# Patient Record
Sex: Female | Born: 1955
Health system: Southern US, Community
[De-identification: ages and names within clinical notes are randomized; demographics above are authoritative.]

## PROBLEM LIST (undated history)

## (undated) DIAGNOSIS — J45909 Unspecified asthma, uncomplicated: Secondary | ICD-10-CM

## (undated) DIAGNOSIS — M1990 Unspecified osteoarthritis, unspecified site: Secondary | ICD-10-CM

## (undated) DIAGNOSIS — F329 Major depressive disorder, single episode, unspecified: Secondary | ICD-10-CM

## (undated) DIAGNOSIS — N182 Chronic kidney disease, stage 2 (mild): Secondary | ICD-10-CM

## (undated) DIAGNOSIS — F32A Depression, unspecified: Secondary | ICD-10-CM

## (undated) DIAGNOSIS — I5022 Chronic systolic (congestive) heart failure: Secondary | ICD-10-CM

## (undated) DIAGNOSIS — M797 Fibromyalgia: Secondary | ICD-10-CM

## (undated) DIAGNOSIS — M549 Dorsalgia, unspecified: Secondary | ICD-10-CM

## (undated) DIAGNOSIS — J189 Pneumonia, unspecified organism: Secondary | ICD-10-CM

## (undated) DIAGNOSIS — I428 Other cardiomyopathies: Secondary | ICD-10-CM

## (undated) DIAGNOSIS — K219 Gastro-esophageal reflux disease without esophagitis: Secondary | ICD-10-CM

## (undated) DIAGNOSIS — E039 Hypothyroidism, unspecified: Secondary | ICD-10-CM

## (undated) DIAGNOSIS — F419 Anxiety disorder, unspecified: Secondary | ICD-10-CM

## (undated) DIAGNOSIS — G4733 Obstructive sleep apnea (adult) (pediatric): Secondary | ICD-10-CM

## (undated) DIAGNOSIS — I447 Left bundle-branch block, unspecified: Secondary | ICD-10-CM

## (undated) DIAGNOSIS — C44300 Unspecified malignant neoplasm of skin of unspecified part of face: Secondary | ICD-10-CM

## (undated) DIAGNOSIS — J449 Chronic obstructive pulmonary disease, unspecified: Secondary | ICD-10-CM

## (undated) DIAGNOSIS — Z9989 Dependence on other enabling machines and devices: Secondary | ICD-10-CM

## (undated) DIAGNOSIS — Z95810 Presence of automatic (implantable) cardiac defibrillator: Secondary | ICD-10-CM

## (undated) DIAGNOSIS — J42 Unspecified chronic bronchitis: Secondary | ICD-10-CM

## (undated) DIAGNOSIS — G8929 Other chronic pain: Secondary | ICD-10-CM

## (undated) HISTORY — PX: HUMERUS FRACTURE SURGERY: SHX670

## (undated) HISTORY — PX: TONSILLECTOMY: SUR1361

## (undated) HISTORY — PX: LAPAROSCOPIC GASTRIC BANDING: SHX1100

## (undated) HISTORY — PX: CARDIAC CATHETERIZATION: SHX172

## (undated) HISTORY — PX: VAGINAL HYSTERECTOMY: SUR661

## (undated) HISTORY — PX: FRACTURE SURGERY: SHX138

## (undated) HISTORY — PX: TUBAL LIGATION: SHX77

## (undated) HISTORY — PX: SHOULDER OPEN ROTATOR CUFF REPAIR: SHX2407

---

## 2004-10-06 ENCOUNTER — Other Ambulatory Visit: Admission: RE | Admit: 2004-10-06 | Discharge: 2004-10-06 | Payer: Self-pay | Admitting: Family Medicine

## 2005-04-27 ENCOUNTER — Other Ambulatory Visit: Admission: RE | Admit: 2005-04-27 | Discharge: 2005-04-27 | Payer: Self-pay | Admitting: Family Medicine

## 2013-09-05 ENCOUNTER — Encounter (HOSPITAL_COMMUNITY): Payer: Self-pay | Admitting: Emergency Medicine

## 2013-09-05 ENCOUNTER — Emergency Department (HOSPITAL_COMMUNITY): Payer: 59

## 2013-09-05 ENCOUNTER — Inpatient Hospital Stay (HOSPITAL_COMMUNITY)
Admission: EM | Admit: 2013-09-05 | Discharge: 2013-09-11 | DRG: 287 | Disposition: A | Discharge status: Home / Self Care | Payer: 59 | Attending: Internal Medicine | Admitting: Internal Medicine

## 2013-09-05 DIAGNOSIS — Z9189 Other specified personal risk factors, not elsewhere classified: Secondary | ICD-10-CM | POA: Diagnosis present

## 2013-09-05 DIAGNOSIS — Z87891 Personal history of nicotine dependence: Secondary | ICD-10-CM | POA: Diagnosis not present

## 2013-09-05 DIAGNOSIS — F329 Major depressive disorder, single episode, unspecified: Secondary | ICD-10-CM | POA: Diagnosis present

## 2013-09-05 DIAGNOSIS — F411 Generalized anxiety disorder: Secondary | ICD-10-CM | POA: Diagnosis present

## 2013-09-05 DIAGNOSIS — I5043 Acute on chronic combined systolic (congestive) and diastolic (congestive) heart failure: Principal | ICD-10-CM | POA: Diagnosis present

## 2013-09-05 DIAGNOSIS — N182 Chronic kidney disease, stage 2 (mild): Secondary | ICD-10-CM | POA: Diagnosis present

## 2013-09-05 DIAGNOSIS — I428 Other cardiomyopathies: Secondary | ICD-10-CM | POA: Diagnosis present

## 2013-09-05 DIAGNOSIS — I447 Left bundle-branch block, unspecified: Secondary | ICD-10-CM | POA: Diagnosis present

## 2013-09-05 DIAGNOSIS — J45909 Unspecified asthma, uncomplicated: Secondary | ICD-10-CM | POA: Diagnosis present

## 2013-09-05 DIAGNOSIS — Z886 Allergy status to analgesic agent status: Secondary | ICD-10-CM | POA: Diagnosis not present

## 2013-09-05 DIAGNOSIS — E039 Hypothyroidism, unspecified: Secondary | ICD-10-CM | POA: Diagnosis present

## 2013-09-05 DIAGNOSIS — I509 Heart failure, unspecified: Secondary | ICD-10-CM | POA: Diagnosis present

## 2013-09-05 DIAGNOSIS — F3289 Other specified depressive episodes: Secondary | ICD-10-CM | POA: Diagnosis present

## 2013-09-05 DIAGNOSIS — Z9889 Other specified postprocedural states: Secondary | ICD-10-CM

## 2013-09-05 DIAGNOSIS — R0602 Shortness of breath: Secondary | ICD-10-CM | POA: Diagnosis present

## 2013-09-05 DIAGNOSIS — IMO0001 Reserved for inherently not codable concepts without codable children: Secondary | ICD-10-CM | POA: Diagnosis present

## 2013-09-05 DIAGNOSIS — R079 Chest pain, unspecified: Secondary | ICD-10-CM | POA: Diagnosis present

## 2013-09-05 HISTORY — DX: Other cardiomyopathies: I42.8

## 2013-09-05 HISTORY — DX: Anxiety disorder, unspecified: F41.9

## 2013-09-05 HISTORY — DX: Major depressive disorder, single episode, unspecified: F32.9

## 2013-09-05 HISTORY — DX: Fibromyalgia: M79.7

## 2013-09-05 HISTORY — DX: Hypothyroidism, unspecified: E03.9

## 2013-09-05 HISTORY — DX: Depression, unspecified: F32.A

## 2013-09-05 HISTORY — DX: Unspecified asthma, uncomplicated: J45.909

## 2013-09-05 LAB — COMPREHENSIVE METABOLIC PANEL
ALT: 102 U/L — AB (ref 0–35)
AST: 56 U/L — AB (ref 0–37)
Albumin: 3.8 g/dL (ref 3.5–5.2)
Alkaline Phosphatase: 89 U/L (ref 39–117)
Anion gap: 12 (ref 5–15)
BILIRUBIN TOTAL: 0.3 mg/dL (ref 0.3–1.2)
BUN: 17 mg/dL (ref 6–23)
CHLORIDE: 107 meq/L (ref 96–112)
CO2: 23 meq/L (ref 19–32)
Calcium: 10 mg/dL (ref 8.4–10.5)
Creatinine, Ser: 0.94 mg/dL (ref 0.50–1.10)
GFR calc Af Amer: 77 mL/min — ABNORMAL LOW (ref >90)
GFR, EST NON AFRICAN AMERICAN: 67 mL/min — AB (ref >90)
Glucose, Bld: 114 mg/dL — ABNORMAL HIGH (ref 70–99)
Potassium: 5.1 mEq/L (ref 3.7–5.3)
SODIUM: 142 meq/L (ref 137–147)
Total Protein: 7.3 g/dL (ref 6.0–8.3)

## 2013-09-05 LAB — CBC WITH DIFFERENTIAL/PLATELET
BASOS ABS: 0 10*3/uL (ref 0.0–0.1)
Basophils Relative: 0 % (ref 0–1)
Eosinophils Absolute: 0 10*3/uL (ref 0.0–0.7)
Eosinophils Relative: 0 % (ref 0–5)
HEMATOCRIT: 40.1 % (ref 36.0–46.0)
Hemoglobin: 13.1 g/dL (ref 12.0–15.0)
LYMPHS PCT: 18 % (ref 12–46)
Lymphs Abs: 1.5 10*3/uL (ref 0.7–4.0)
MCH: 29.8 pg (ref 26.0–34.0)
MCHC: 32.7 g/dL (ref 30.0–36.0)
MCV: 91.1 fL (ref 78.0–100.0)
MONO ABS: 0.4 10*3/uL (ref 0.1–1.0)
Monocytes Relative: 5 % (ref 3–12)
Neutro Abs: 6.3 10*3/uL (ref 1.7–7.7)
Neutrophils Relative %: 77 % (ref 43–77)
PLATELETS: 202 10*3/uL (ref 150–400)
RBC: 4.4 MIL/uL (ref 3.87–5.11)
RDW: 14.5 % (ref 11.5–15.5)
WBC: 8.2 10*3/uL (ref 4.0–10.5)

## 2013-09-05 LAB — TROPONIN I: Troponin I: <0.3 ng/mL (ref <0.30)

## 2013-09-05 LAB — PRO B NATRIURETIC PEPTIDE: Pro B Natriuretic peptide (BNP): 8605 pg/mL — ABNORMAL HIGH (ref 0–125)

## 2013-09-05 MED ORDER — FUROSEMIDE 10 MG/ML IJ SOLN
40.0000 mg | Freq: Once | INTRAMUSCULAR | Status: AC
  Administered 2013-09-05: 40 mg via INTRAVENOUS
  Filled 2013-09-05: qty 4

## 2013-09-05 MED ORDER — NITROGLYCERIN 2 % TD OINT
1.0000 [in_us] | TOPICAL_OINTMENT | Freq: Four times a day (QID) | TRANSDERMAL | Status: DC
  Administered 2013-09-05: 1 [in_us] via TOPICAL
  Filled 2013-09-05: qty 1

## 2013-09-05 MED ORDER — IOHEXOL 350 MG/ML SOLN
100.0000 mL | Freq: Once | INTRAVENOUS | Status: AC | PRN
  Administered 2013-09-05: 100 mL via INTRAVENOUS

## 2013-09-05 NOTE — ED Provider Notes (Signed)
CSN: [id number]     Arrival date & time 09/05/13  1638 History   First MD Initiated Contact with Patient 09/05/13 1649     Chief Complaint  Patient presents with  . Chest Pain  . Shortness of Breath     (Consider location/radiation/quality/duration/timing/severity/associated sxs/prior Treatment) HPI Comments: Patient presents to the ER for evaluation of shortness of breath and chest discomfort. Patient was referred to the emergency department by urgent care. Patient reports that she first started to feel short of breath for 2 or 3 days ago. She was seen in urgent care and prescribed prednisone, albuterol and a Z-Pak. She did not improve. Over last 2 days she has had progressively worsening difficulty breathing. She reports that she cannot lie flat night because of shortness of breath, has been sleeping sitting up. She has noticed that the breathing difficulty worsens when she gets up and walks. She has developed anterior chest pain and pain between her shoulder blades with exertion which resolves with rest. She presented once again to urgent care today, had an abnormal EKG and was sent to the ER. She has been given 324 mg aspirin, one sublingual nitroglycerin with improvement of her pain.  Patient is a 58 y.o. female presenting with chest pain and shortness of breath.  Chest Pain Associated symptoms: shortness of breath   Shortness of Breath Associated symptoms: chest pain     Past Medical History  Diagnosis Date  . Asthma   . Bronchitis   . PNA (pneumonia)   . Thyroid disease   . Fibromyalgia   . Depression   . Anxiety    Past Surgical History  Procedure Laterality Date  . Abdominal hysterectomy    . Shoulder open rotator cuff repair Left   . Laparoscopic gastric banding     History reviewed. No pertinent family history. History  Substance Use Topics  . Smoking status: Former Smoker    Types: Cigarettes  . Smokeless tobacco: Never Used  . Alcohol Use: Yes     Comment:  occasionally   OB History   Grav Para Term Preterm Abortions TAB SAB Ect Mult Living                 Review of Systems  Respiratory: Positive for shortness of breath.   Cardiovascular: Positive for chest pain.  All other systems reviewed and are negative.     Allergies  Percocet  Home Medications   Prior to Admission medications   Medication Sig Start Date End Date Taking? Authorizing Provider  levothyroxine (SYNTHROID, LEVOTHROID) 150 MCG tablet Take 150 mcg by mouth daily before breakfast.   Yes Historical Provider, MD  sertraline (ZOLOFT) 100 MG tablet Take 100 mg by mouth daily.   Yes Historical Provider, MD   BP 109/69  Pulse 99  Temp(Src) 97.3 F (36.3 C) (Oral)  Resp 21  SpO2 94% Physical Exam  Constitutional: She is oriented to person, place, and time. She appears well-developed and well-nourished. No distress.  HENT:  Head: Normocephalic and atraumatic.  Right Ear: Hearing normal.  Left Ear: Hearing normal.  Nose: Nose normal.  Mouth/Throat: Oropharynx is clear and moist and mucous membranes are normal.  Eyes: Conjunctivae and EOM are normal. Pupils are equal, round, and reactive to light.  Neck: Normal range of motion. Neck supple.  Cardiovascular: Regular rhythm, S1 normal and S2 normal.  Exam reveals no gallop and no friction rub.   No murmur heard. Pulmonary/Chest: Effort normal and breath sounds normal. No respiratory  distress. She exhibits no tenderness.  Abdominal: Soft. Normal appearance and bowel sounds are normal. There is no hepatosplenomegaly. There is no tenderness. There is no rebound, no guarding, no tenderness at McBurney's point and negative Murphy's sign. No hernia.  Musculoskeletal: Normal range of motion.  Neurological: She is alert and oriented to person, place, and time. She has normal strength. No cranial nerve deficit or sensory deficit. Coordination normal. GCS eye subscore is 4. GCS verbal subscore is 5. GCS motor subscore is 6.  Skin:  Skin is warm, dry and intact. No rash noted. No cyanosis.  Psychiatric: She has a normal mood and affect. Her speech is normal and behavior is normal. Thought content normal.    ED Course  Procedures (including critical care time) Labs Review Labs Reviewed  COMPREHENSIVE METABOLIC PANEL - Abnormal; Notable for the following:    Glucose, Bld 114 (*)    AST 56 (*)    ALT 102 (*)    GFR calc non Af Amer 67 (*)    GFR calc Af Amer 77 (*)    All other components within normal limits  PRO B NATRIURETIC PEPTIDE - Abnormal; Notable for the following:    Pro B Natriuretic peptide (BNP) 8605.0 (*)    All other components within normal limits  CBC WITH DIFFERENTIAL  TROPONIN I    Imaging Review Dg Chest 2 View  09/05/2013   CLINICAL DATA:  Chest pain and shortness of breath. Wheezing. Difficulty breathing.  EXAM: CHEST  2 VIEW  COMPARISON:  Two-view chest 09/05/2013  FINDINGS: Heart is mildly enlarged without change. Mild pulmonary vascular congestion is evident. Small bilateral pleural effusions are now present. Bibasilar atelectasis is noted.  IMPRESSION: 1. Increasing pulmonary vascular congestion and small bilateral pleural effusions concerning for early congestive heart failure. 2. Bibasilar airspace disease likely reflects atelectasis.   Electronically Signed   By: Lawrence Santiago M.D.   On: 09/05/2013 18:38   Ct Angio Chest Pe W/cm &/or Wo Cm  09/05/2013   CLINICAL DATA:  Chest tightness and shortness of breath.  EXAM: CT ANGIOGRAPHY CHEST WITH CONTRAST  TECHNIQUE: Multidetector CT imaging of the chest was performed using the standard protocol during bolus administration of intravenous contrast. Multiplanar CT image reconstructions and MIPs were obtained to evaluate the vascular anatomy.  CONTRAST:  124mL OMNIPAQUE IOHEXOL 350 MG/ML SOLN  COMPARISON:  Chest x-ray 09/05/2013.  FINDINGS: Limited exam due to patient's size and motion artifact. Thoracic aorta is non dilated. Severe cardiomegaly. No  pulmonary embolus identified.  No definite mediastinal or hilar adenopathy. Again this exam is limited due to patient's size and motion artifact. Thoracic esophagus is nondistended.  Large airways are patent. Diffuse interstitial prominence and small bilateral pleural effusions are present. These findings suggest congestive heart failure. Pneumonitis cannot be excluded.  Prior gastric surgery. 4.8 cm cyst central portion of the liver. 1 cm cyst periphery of the right lobe of the liver.  No significant axillary adenopathy. Chest wall is intact. No acute bony abnormality.  Review of the MIP images confirms the above findings.  IMPRESSION: 1. Limited exam due to patient's size and motion artifact. No pulmonary embolus identified. 2. Findings suggesting congestive heart failure with pulmonary interstitial edema. Pneumonitis cannot be excluded .   Electronically Signed   By: Marcello Moores  Register   On: 09/05/2013 20:45     EKG Interpretation   Date/Time:  Friday September 05 2013 16:49:30 EDT Ventricular Rate:  97 PR Interval:  138 QRS Duration: 168  QT Interval:  422 QTC Calculation: 536 R Axis:   -8 Text Interpretation:  Sinus rhythm Probable left atrial enlargement Left  bundle branch block Baseline wander in lead(s) I II aVR aVF V6 No previous  tracing Confirmed by POLLINA  MD, [place] ([id number]) on 09/05/2013 5:08:17  PM      MDM   Final diagnoses:  None   acute onset congestive heart failure  Patient presents to ER for evaluation of difficulty breathing and chest discomfort. Symptoms began 3 days ago. She has been extensively progressively worsening dyspnea on exertion, orthopnea and exertional dyspnea with chest pain. Patient was sent from urgent care because x-ray showed possible congestive heart failure and she had an abnormal EKG. EKG here shows left bundle branch block. This is of unknown chronicity, we do not have previous EKGs. Patient reports that approximately 3 years ago she had an EKG  and was reportedly normal. She has not, however, experiencing acute chest pain this is not considered to be an acute MI at this point. She does, however, have cardiomegaly with some findings consistent with congestive heart failure. Check a markedly elevated BNP. I did consider right heart strain elevated BNP, PE study was negative, however. As this is a new onset congestive heart failure presentation with significant symptomatology, cardiology consult to evaluate for further management.    Orpah Greek, MD 09/05/13 [phone]

## 2013-09-05 NOTE — H&P (Signed)
Physician History and Physical    [name] MRN: [id number] DOB/AGE: 1955-09-24 58 y.o. Admit date: 09/05/2013  Primary Cardiologist:  None  CC:  SOB  HPI:  58 yo female with h/o asthma who presented to ER with SOB/DOE/Chest pressure/Orthopnea/PND. Patient states that her symptoms first started about 2 months ago when she became quickly fatigued with decreased functional status. The symptoms gradually worsened . She started to have markedly SOB with orthopnea, PND and chest pressure. She went to urgent care where an ECG reveal LBBB and she was sent to [facility] ER.  She denied any significant chest pain or syncope.  She is former smoker but quit 25 yrs ago. She dose not abuse EtOH.    Review of systems: A review of 10 organ systems was done and is negative except as stated above in HPI  Past Medical History  Diagnosis Date  . Asthma   . Bronchitis   . PNA (pneumonia)   . Thyroid disease   . Fibromyalgia   . Depression   . Anxiety    Past Surgical History  Procedure Laterality Date  . Abdominal hysterectomy    . Shoulder open rotator cuff repair Left   . Laparoscopic gastric banding     History   Social History  . Marital Status: Widowed    Spouse Name: N/A    Number of Children: N/A  . Years of Education: N/A   Occupational History  . Not on file.   Social History Main Topics  . Smoking status: Former Smoker    Types: Cigarettes  . Smokeless tobacco: Never Used  . Alcohol Use: Yes     Comment: occasionally  . Drug Use: No  . Sexual Activity: Not on file   Other Topics Concern  . Not on file   Social History Narrative  . No narrative on file    History reviewed. No pertinent family history.   Allergies  Allergen Reactions  . Percocet [Oxycodone-Acetaminophen] Itching     (Not in a hospital admission)  Current Facility-Administered Medications  Medication Dose Route Frequency Provider Last Rate Last Dose  . nitroGLYCERIN (NITROGLYN) 2 % ointment  1 inch  1 inch Topical 4 times per day Orpah Greek, MD   1 inch at 09/05/13 1735   Current Outpatient Prescriptions  Medication Sig Dispense Refill  . levothyroxine (SYNTHROID, LEVOTHROID) 150 MCG tablet Take 150 mcg by mouth daily before breakfast.      . sertraline (ZOLOFT) 100 MG tablet Take 100 mg by mouth daily.        Physical Exam: Blood pressure 109/69, pulse 99, temperature 97.3 F (36.3 C), temperature source Oral, resp. rate 21, SpO2 94.00%.; There is no height or weight on file to calculate BMI. Temp:  [97.3 F (36.3 C)] 97.3 F (36.3 C) (08/28 1641) Pulse Rate:  [92-107] 99 (08/28 2245) Resp:  [11-26] 21 (08/28 2245) BP: (102-130)/(66-94) 109/69 mmHg (08/28 2245) SpO2:  [94 %-100 %] 94 % (08/28 2245)   Intake/Output Summary (Last 24 hours) at 09/05/13 2323 Last data filed at 09/05/13 2235  Gross per 24 hour  Intake      0 ml  Output   1200 ml  Net  -1200 ml   General: NAD Heent: MMM Neck: No JVD  CV: Nondisplaced PMI.  RRR, nl S1/S2, no S3/S4, no murmur. No carotid bruit   Lungs: Clear to auscultation bilaterally with normal respiratory effort Abdomen: Soft, nontender, nondistended Extremities: No clubbing or cyanosis.  Normal  pedal pulses. No pedal edema Skin: Intact without lesions or rashes  Neurologic: Alert and oriented x 3, grossly nonfocal  Psych: Normal mood and affect    Labs:  Recent Labs  09/05/13 1720  TROPONINI <0.30   Lab Results  Component Value Date   WBC 8.2 09/05/2013   HGB 13.1 09/05/2013   HCT 40.1 09/05/2013   MCV 91.1 09/05/2013   PLT 202 09/05/2013    Recent Labs Lab 09/05/13 1720  NA 142  K 5.1  CL 107  CO2 23  BUN 17  CREATININE 0.94  CALCIUM 10.0  PROT 7.3  BILITOT 0.3  ALKPHOS 89  ALT 102*  AST 56*  GLUCOSE 114*   No results found for this basename: CHOL, HDL, LDLCALC, TRIG     EKG:  Sinus with LBBB and LAE.  Radiology:  Dg Chest 2 View  09/05/2013   CLINICAL DATA:  Chest pain and shortness of  breath. Wheezing. Difficulty breathing.  EXAM: CHEST  2 VIEW  COMPARISON:  Two-view chest 09/05/2013  FINDINGS: Heart is mildly enlarged without change. Mild pulmonary vascular congestion is evident. Small bilateral pleural effusions are now present. Bibasilar atelectasis is noted.  IMPRESSION: 1. Increasing pulmonary vascular congestion and small bilateral pleural effusions concerning for early congestive heart failure. 2. Bibasilar airspace disease likely reflects atelectasis.   Electronically Signed   By: Lawrence Santiago M.D.   On: 09/05/2013 18:38   Ct Angio Chest Pe W/cm &/or Wo Cm  09/05/2013   CLINICAL DATA:  Chest tightness and shortness of breath.  EXAM: CT ANGIOGRAPHY CHEST WITH CONTRAST  TECHNIQUE: Multidetector CT imaging of the chest was performed using the standard protocol during bolus administration of intravenous contrast. Multiplanar CT image reconstructions and MIPs were obtained to evaluate the vascular anatomy.  CONTRAST:  134mL OMNIPAQUE IOHEXOL 350 MG/ML SOLN  COMPARISON:  Chest x-ray 09/05/2013.  FINDINGS: Limited exam due to patient's size and motion artifact. Thoracic aorta is non dilated. Severe cardiomegaly. No pulmonary embolus identified.  No definite mediastinal or hilar adenopathy. Again this exam is limited due to patient's size and motion artifact. Thoracic esophagus is nondistended.  Large airways are patent. Diffuse interstitial prominence and small bilateral pleural effusions are present. These findings suggest congestive heart failure. Pneumonitis cannot be excluded.  Prior gastric surgery. 4.8 cm cyst central portion of the liver. 1 cm cyst periphery of the right lobe of the liver.  No significant axillary adenopathy. Chest wall is intact. No acute bony abnormality.  Review of the MIP images confirms the above findings.  IMPRESSION: 1. Limited exam due to patient's size and motion artifact. No pulmonary embolus identified. 2. Findings suggesting congestive heart failure with  pulmonary interstitial edema. Pneumonitis cannot be excluded .   Electronically Signed   By: Marcello Moores  Register   On: 09/05/2013 20:45    ASSESSMENT:  58 yo female presented with symptoms consistent with new HF  IMPRESSIONS: 1. Newly diagnosed heart failure, suspecting ICM 2. LBBB 3. Fluid overload  PLAN:  1. Echocardiogram for systolic function and wall motion 2. If systolic dysfunction confirmed, she will need a LHC.  3. Check TSH to rule out hypothyroidism as the cause of HF.  4. Diuresis with lasix 40 mg BID, patient is Lasix naive. Now -1200 ml with one dose of lasix. Up titrate for goal negative 1.5 to 2 L next 24 hrs, in preparation for potential cath,   5. If low EF confirmed, she will need afterload reduction with ACEI, ASA,  statin. When she becomes euvolemic,she will need a beta-blocker.   Signed: Manus Gunning, MD Cardiology Fellow 09/05/2013, 11:23 PM

## 2013-09-05 NOTE — ED Notes (Addendum)
Pt reports to the ED for eval of chest tightness and SOB with minimal exertion. Pt describes the pain as midsternal chest pressure and tightness and reports it intermittently radiates into her back. Pt reports the symptoms have been ongoing x 2 days but she became increasingly worse today. Pt is from [facility] in [place] where she was recently seen anddx with asthamtic bronchitis and d/c home with a Z-pack, steroids, and albuterol. Pt reports she has been complaint with these medications but it has not significantly help her symptoms. Pt reports she cannot sleep the past few nights r/t orthopnea. Crackles in the lower lobes per [facility] MD. Pt denies any cardiac hx. 12 lead NSR with LBBB en route. Pt received 324 of ASA and 1 nitro and reports her chest pressure did decrease. Pt A&Ox4, resp e/u, and skin warm and dry.

## 2013-09-06 ENCOUNTER — Encounter (HOSPITAL_COMMUNITY): Payer: Self-pay | Admitting: Emergency Medicine

## 2013-09-06 DIAGNOSIS — I059 Rheumatic mitral valve disease, unspecified: Secondary | ICD-10-CM

## 2013-09-06 DIAGNOSIS — I509 Heart failure, unspecified: Secondary | ICD-10-CM

## 2013-09-06 LAB — BASIC METABOLIC PANEL
Anion gap: 15 (ref 5–15)
BUN: 18 mg/dL (ref 6–23)
CALCIUM: 10.2 mg/dL (ref 8.4–10.5)
CO2: 22 meq/L (ref 19–32)
Chloride: 103 mEq/L (ref 96–112)
Creatinine, Ser: 0.9 mg/dL (ref 0.50–1.10)
GFR calc Af Amer: 81 mL/min — ABNORMAL LOW (ref >90)
GFR, EST NON AFRICAN AMERICAN: 70 mL/min — AB (ref >90)
GLUCOSE: 88 mg/dL (ref 70–99)
Potassium: 4.2 mEq/L (ref 3.7–5.3)
SODIUM: 140 meq/L (ref 137–147)

## 2013-09-06 LAB — CBC
HCT: 42.6 % (ref 36.0–46.0)
Hemoglobin: 14 g/dL (ref 12.0–15.0)
MCH: 29.7 pg (ref 26.0–34.0)
MCHC: 32.9 g/dL (ref 30.0–36.0)
MCV: 90.4 fL (ref 78.0–100.0)
Platelets: 230 10*3/uL (ref 150–400)
RBC: 4.71 MIL/uL (ref 3.87–5.11)
RDW: 14.5 % (ref 11.5–15.5)
WBC: 11.2 10*3/uL — ABNORMAL HIGH (ref 4.0–10.5)

## 2013-09-06 LAB — CREATININE, SERUM
Creatinine, Ser: 0.92 mg/dL (ref 0.50–1.10)
GFR calc non Af Amer: 68 mL/min — ABNORMAL LOW (ref >90)
GFR, EST AFRICAN AMERICAN: 79 mL/min — AB (ref >90)

## 2013-09-06 LAB — TROPONIN I: Troponin I: <0.3 ng/mL (ref <0.30)

## 2013-09-06 LAB — TSH: TSH: 4.83 u[IU]/mL — AB (ref 0.350–4.500)

## 2013-09-06 MED ORDER — FUROSEMIDE 40 MG PO TABS
40.0000 mg | ORAL_TABLET | Freq: Two times a day (BID) | ORAL | Status: DC
  Administered 2013-09-06 – 2013-09-10 (×9): 40 mg via ORAL
  Filled 2013-09-06 (×13): qty 1

## 2013-09-06 MED ORDER — SERTRALINE HCL 100 MG PO TABS
100.0000 mg | ORAL_TABLET | Freq: Every day | ORAL | Status: DC
  Administered 2013-09-06 – 2013-09-11 (×6): 100 mg via ORAL
  Filled 2013-09-06 (×6): qty 1

## 2013-09-06 MED ORDER — ASPIRIN EC 81 MG PO TBEC
81.0000 mg | DELAYED_RELEASE_TABLET | Freq: Every day | ORAL | Status: DC
  Administered 2013-09-06 – 2013-09-11 (×6): 81 mg via ORAL
  Filled 2013-09-06 (×6): qty 1

## 2013-09-06 MED ORDER — SODIUM CHLORIDE 0.9 % IJ SOLN: 3.0000 mL | INTRAMUSCULAR | Status: DC | PRN

## 2013-09-06 MED ORDER — LEVOTHYROXINE SODIUM 150 MCG PO TABS
150.0000 ug | ORAL_TABLET | Freq: Every day | ORAL | Status: DC
  Administered 2013-09-06 – 2013-09-11 (×6): 150 ug via ORAL
  Filled 2013-09-06 (×7): qty 1

## 2013-09-06 MED ORDER — ONDANSETRON HCL 4 MG/2ML IJ SOLN
4.0000 mg | Freq: Four times a day (QID) | INTRAMUSCULAR | Status: DC | PRN
Start: 2013-09-06 — End: 2013-09-11

## 2013-09-06 MED ORDER — HEPARIN SODIUM (PORCINE) 5000 UNIT/ML IJ SOLN
5000.0000 [IU] | Freq: Three times a day (TID) | INTRAMUSCULAR | Status: DC
  Administered 2013-09-06 – 2013-09-07 (×6): 5000 [IU] via SUBCUTANEOUS
  Filled 2013-09-06 (×9): qty 1

## 2013-09-06 MED ORDER — SODIUM CHLORIDE 0.9 % IJ SOLN
3.0000 mL | Freq: Two times a day (BID) | INTRAMUSCULAR | Status: DC
  Administered 2013-09-06 – 2013-09-10 (×9): 3 mL via INTRAVENOUS

## 2013-09-06 MED ORDER — ACETAMINOPHEN 325 MG PO TABS
650.0000 mg | ORAL_TABLET | Freq: Four times a day (QID) | ORAL | Status: DC | PRN
  Administered 2013-09-06: 650 mg via ORAL
  Filled 2013-09-06: qty 2

## 2013-09-06 MED ORDER — MORPHINE SULFATE 2 MG/ML IJ SOLN
1.0000 mg | INTRAMUSCULAR | Status: DC | PRN
  Administered 2013-09-06: 1 mg via INTRAVENOUS
  Filled 2013-09-06: qty 1

## 2013-09-06 MED ORDER — SODIUM CHLORIDE 0.9 % IV SOLN
250.0000 mL | INTRAVENOUS | Status: DC | PRN
Start: 2013-09-06 — End: 2013-09-11

## 2013-09-06 NOTE — Progress Notes (Signed)
Patient experiencing chest pain, rating 4/10, radiating to left shoulder.  Vital signs stable, EKG performed and in chart.  Dr. Harl Bowie notified and at bedside.  Will continue to monitor.

## 2013-09-06 NOTE — Progress Notes (Signed)
  Echocardiogram 2D Echocardiogram has been performed.  Diamond Nickel 09/06/2013, 3:39 PM

## 2013-09-06 NOTE — Progress Notes (Signed)
Patient alert and oriented x3. Patient had chest pain and morphine was ordered. EKG and vitals were stable. Morphine helped with chest pain and patient comfortable. Ambulating to bathroom. Will continue to monitor.

## 2013-09-06 NOTE — Progress Notes (Signed)
Patient ID: [name], female   DOB: [birth year], 57 y.o.   MRN: [id number]      Subjective:    Mild chest pain this morning midchest, somewhat positional and reproducible. Similar to pain at home. SOB has improved, swelling has improved.   Objective:   Temp:  [97.3 F (36.3 C)-97.6 F (36.4 C)] 97.3 F (36.3 C) (08/29 0527) Pulse Rate:  [92-107] 98 (08/29 1029) Resp:  [11-26] 18 (08/29 1029) BP: (102-130)/(66-94) 127/85 mmHg (08/29 1029) SpO2:  [94 %-100 %] 97 % (08/29 1029) Weight:  [187 lb 3.2 oz (84.913 kg)] 187 lb 3.2 oz (84.913 kg) (08/29 0042) Last BM Date: 09/06/13  Filed Weights   09/06/13 0042  Weight: 187 lb 3.2 oz (84.913 kg)    Intake/Output Summary (Last 24 hours) at 09/06/13 1042 Last data filed at 09/06/13 0200  Gross per 24 hour  Intake      0 ml  Output   1850 ml  Net  -1850 ml    Telemetry: SR, LBBB  Exam:  General: NAD  Resp: CTAB  Cardiac: RRR, no m/r/g, no JVD  GI: abdomen soft, NT, ND  MSK: no LE edema  Neuro: no focal deficits  Lab Results:  Basic Metabolic Panel:  Recent Labs Lab 09/05/13 1720 09/06/13 0115 09/06/13 0311  NA 142  --  140  K 5.1  --  4.2  CL 107  --  103  CO2 23  --  22  GLUCOSE 114*  --  88  BUN 17  --  18  CREATININE 0.94 0.92 0.90  CALCIUM 10.0  --  10.2    Liver Function Tests:  Recent Labs Lab 09/05/13 1720  AST 56*  ALT 102*  ALKPHOS 89  BILITOT 0.3  PROT 7.3  ALBUMIN 3.8    CBC:  Recent Labs Lab 09/05/13 1720 09/06/13 0115  WBC 8.2 11.2*  HGB 13.1 14.0  HCT 40.1 42.6  MCV 91.1 90.4  PLT 202 230    Cardiac Enzymes:  Recent Labs Lab 09/05/13 1720 09/06/13 0118 09/06/13 0705  TROPONINI <0.30 <0.30 <0.30    BNP:  Recent Labs  09/05/13 1720  PROBNP 8605.0*    Coagulation: No results found for this basename: INR,  in the last 168 hours  ECG:   Medications:   Scheduled Medications: . aspirin EC  81 mg Oral Daily  . furosemide  40 mg Oral BID  . heparin   5,000 Units Subcutaneous 3 times per day  . levothyroxine  150 mcg Oral QAC breakfast  . sertraline  100 mg Oral Daily  . sodium chloride  3 mL Intravenous Q12H     Infusions:     PRN Medications:  sodium chloride, acetaminophen, ondansetron (ZOFRAN) IV, sodium chloride     Assessment/Plan   58 yo female hx of asthmas, fibromyalgia, depression/anxiety admitted with SOB.   1. SOB - pro-BNP 8605, trop neg x3. EKG with LBBB of unknown duration - CT PE no PE, findings consistent with CHF. CXR pulm edema.  - echo is pending  2. Acute heart failure - negative 1.9 liters since admission, renal function remains stable. Strong respone to IV lasix x1, will continue oral lasix today.    3. Chest pain - negative enzymes, EKG shows LBBB of unclear duration.  - symptoms somewhat atypical in that they are somewhat positional and reproducible.  - noted LBBB on EKG, unclear duration. If acute trops would have been positive by now - f/u echo, pending results  will need eithe Lexi or cath - severe headache on NG, will try prn morphine    Carlyle Dolly, M.D., F.A.C.C.

## 2013-09-07 LAB — PROTIME-INR
INR: 1.11 (ref 0.00–1.49)
Prothrombin Time: 14.3 seconds (ref 11.6–15.2)

## 2013-09-07 LAB — MAGNESIUM: MAGNESIUM: 2.2 mg/dL (ref 1.5–2.5)

## 2013-09-07 LAB — BASIC METABOLIC PANEL
ANION GAP: 15 (ref 5–15)
BUN: 23 mg/dL (ref 6–23)
CO2: 27 meq/L (ref 19–32)
Calcium: 10.9 mg/dL — ABNORMAL HIGH (ref 8.4–10.5)
Chloride: 100 mEq/L (ref 96–112)
Creatinine, Ser: 0.99 mg/dL (ref 0.50–1.10)
GFR calc Af Amer: 72 mL/min — ABNORMAL LOW (ref >90)
GFR, EST NON AFRICAN AMERICAN: 63 mL/min — AB (ref >90)
Glucose, Bld: 85 mg/dL (ref 70–99)
Potassium: 3.9 mEq/L (ref 3.7–5.3)
SODIUM: 142 meq/L (ref 137–147)

## 2013-09-07 LAB — APTT: aPTT: 30 seconds (ref 24–37)

## 2013-09-07 MED ORDER — METOPROLOL SUCCINATE 12.5 MG HALF TABLET
12.5000 mg | ORAL_TABLET | Freq: Every day | ORAL | Status: DC
  Administered 2013-09-07 – 2013-09-11 (×5): 12.5 mg via ORAL
  Filled 2013-09-07 (×5): qty 1

## 2013-09-07 MED ORDER — POLYETHYLENE GLYCOL 3350 17 G PO PACK
17.0000 g | PACK | Freq: Every day | ORAL | Status: DC
  Administered 2013-09-07 – 2013-09-10 (×4): 17 g via ORAL
  Filled 2013-09-07 (×5): qty 1

## 2013-09-07 NOTE — Plan of Care (Signed)
Problem: Phase I Progression Outcomes Goal: EF % per last Echo/documented,Core Reminder form on chart Outcome: Completed/Met Date Met:  09/07/13 EF 20-25% as of 08/2013

## 2013-09-07 NOTE — Progress Notes (Signed)
Patient alert and oriented x4 throughout shift, vital signs stable.  Family at bedside throughout shift.  Patient made aware of NPO status at midnight for heart cath tomorrow.  Cath pamphlet provided.  Consent signed and in chart.  Patient and family deny any questions or concerns at this time.  Will continue to monitor.

## 2013-09-07 NOTE — Plan of Care (Signed)
Problem: Phase I Progression Outcomes Goal: Up in chair, BRP Outcome: Completed/Met Date Met:  09/07/13 Patient ambulatory in room

## 2013-09-07 NOTE — Progress Notes (Signed)
Patient ID: Falynn Ailey, female   DOB: 12/27/1955, 58 y.o.   MRN: [id number]      Subjective:    SOB improving but not resolved  Objective:   Temp:  [98.2 F (36.8 C)-98.4 F (36.9 C)] 98.4 F (36.9 C) (08/30 0649) Pulse Rate:  [89-98] 93 (08/30 0649) Resp:  [18] 18 (08/30 0649) BP: (116-127)/(79-85) 116/84 mmHg (08/30 0649) SpO2:  [96 %-97 %] 97 % (08/30 0649) Weight:  [181 lb (82.101 kg)] 181 lb (82.101 kg) (08/30 0649) Last BM Date: 09/06/13  Filed Weights   09/06/13 0042 09/07/13 0649  Weight: 187 lb 3.2 oz (84.913 kg) 181 lb (82.101 kg)    Intake/Output Summary (Last 24 hours) at 09/07/13 0949 Last data filed at 09/07/13 0826  Gross per 24 hour  Intake    540 ml  Output   1900 ml  Net  -1360 ml    Telemetry: SR, LBBB  Exam:  General: NAD  Resp: faint crackles in bases  Cardiac: RRR, no m/r/g, no JVD  XH:BZJIRCV soft, NT, ND  MSK: no LE edema  Neuro: no focal deficits    Lab Results:  Basic Metabolic Panel:  Recent Labs Lab 09/05/13 1720 09/06/13 0115 09/06/13 0311 09/07/13 0335  NA 142  --  140 142  K 5.1  --  4.2 3.9  CL 107  --  103 100  CO2 23  --  22 27  GLUCOSE 114*  --  88 85  BUN 17  --  18 23  CREATININE 0.94 0.92 0.90 0.99  CALCIUM 10.0  --  10.2 10.9*  MG  --   --   --  2.2    Liver Function Tests:  Recent Labs Lab 09/05/13 1720  AST 56*  ALT 102*  ALKPHOS 89  BILITOT 0.3  PROT 7.3  ALBUMIN 3.8    CBC:  Recent Labs Lab 09/05/13 1720 09/06/13 0115  WBC 8.2 11.2*  HGB 13.1 14.0  HCT 40.1 42.6  MCV 91.1 90.4  PLT 202 230    Cardiac Enzymes:  Recent Labs Lab 09/06/13 0118 09/06/13 0705 09/06/13 1340  TROPONINI <0.30 <0.30 <0.30    BNP:  Recent Labs  09/05/13 1720  PROBNP 8605.0*    Coagulation: No results found for this basename: INR,  in the last 168 hours  ECG:   Medications:   Scheduled Medications: . aspirin EC  81 mg Oral Daily  . furosemide  40 mg Oral BID  . heparin  5,000  Units Subcutaneous 3 times per day  . levothyroxine  150 mcg Oral QAC breakfast  . polyethylene glycol  17 g Oral Daily  . sertraline  100 mg Oral Daily  . sodium chloride  3 mL Intravenous Q12H     Infusions:     PRN Medications:  sodium chloride, acetaminophen, morphine injection, ondansetron (ZOFRAN) IV, sodium chloride   08/2013 Echo  Study Conclusions  - Left ventricle: There is diffuse hypokinesis with akinesis of the entire anterior, basal and mid anterolateral walls and paradoxical septal motion. The cavity size was severely dilated. Systolic function was severely reduced. The estimated ejection fraction was in the range of 15-20%. Features are consistent with a pseudonormal left ventricular filling pattern, with concomitant abnormal relaxation and increased filling pressure (grade 2 diastolic dysfunction). Doppler parameters are consistent with elevated ventricular end-diastolic filling pressure. - Aortic valve: Trileaflet; normal thickness leaflets. There was no regurgitation. - Aortic root: The aortic root was normal in size. - Left atrium:  The atrium was mildly dilated. - Right ventricle: Systolic function was mildly reduced. - Right atrium: The atrium was normal in size. - Tricuspid valve: There was moderate regurgitation. - Pulmonary arteries: Systolic pressure was moderately to severely increased. PA peak pressure: 57 mm Hg (S).    Assessment/Plan    1. Acute systolic heart failure  - echo results back, LVEF 15-20% with severe LV dilatation, grade II diastolic dysfunction, moderate TR with PASP 57. TSH 4.830.  - negative 860 mL yesterday day, negative 3.2 liters since admission, renal function remains stable. Strong respone to IV lasix x1, she has been responding well to just oral lasix. - due to new diagnosis of severe LV systolic dysfunction, LBBB, and intermittent though atypical chest pain will plan for inpatient LHC/RHC. Her TSH is elevated mildly,  does not explain this degree of LV systolic dysfunction, she is on replacement.  - now more euvolemic start low dose Toprol XL 12.5mg  daily, likely start low dose ACE tomorrow.    2. Chest pain  - negative enzymes, EKG shows LBBB of unclear duration.  - symptoms somewhat atypical in that they are somewhat positional and reproducible.  - noted LBBB on EKG, unclear duration. If acute trops would have been positive by now  - plan for LHC as described above         Carlyle Dolly, M.D., F.A.C.C.

## 2013-09-08 ENCOUNTER — Encounter (HOSPITAL_COMMUNITY)
Admission: EM | Disposition: A | Discharge status: Home / Self Care | Payer: Self-pay | Source: Home / Self Care | Attending: Internal Medicine

## 2013-09-08 DIAGNOSIS — I509 Heart failure, unspecified: Secondary | ICD-10-CM

## 2013-09-08 HISTORY — PX: LEFT AND RIGHT HEART CATHETERIZATION WITH CORONARY ANGIOGRAM: SHX5449

## 2013-09-08 LAB — POCT I-STAT 3, VENOUS BLOOD GAS (G3P V)
Acid-Base Excess: 2 mmol/L (ref 0.0–2.0)
Bicarbonate: 28.6 meq/L — ABNORMAL HIGH (ref 20.0–24.0)
O2 Saturation: 68 %
TCO2: 30 mmol/L (ref 0–100)
pCO2, Ven: 52.1 mmHg — ABNORMAL HIGH (ref 45.0–50.0)
pH, Ven: 7.347 — ABNORMAL HIGH (ref 7.250–7.300)
pO2, Ven: 38 mmHg (ref 30.0–45.0)

## 2013-09-08 LAB — BASIC METABOLIC PANEL WITH GFR
Anion gap: 17 — ABNORMAL HIGH (ref 5–15)
BUN: 29 mg/dL — ABNORMAL HIGH (ref 6–23)
CO2: 26 meq/L (ref 19–32)
Calcium: 10.9 mg/dL — ABNORMAL HIGH (ref 8.4–10.5)
Chloride: 98 meq/L (ref 96–112)
Creatinine, Ser: 1.04 mg/dL (ref 0.50–1.10)
GFR calc Af Amer: 68 mL/min — ABNORMAL LOW (ref >90)
GFR calc non Af Amer: 59 mL/min — ABNORMAL LOW (ref >90)
Glucose, Bld: 101 mg/dL — ABNORMAL HIGH (ref 70–99)
Potassium: 4.2 meq/L (ref 3.7–5.3)
Sodium: 141 meq/L (ref 137–147)

## 2013-09-08 LAB — POCT I-STAT 3, ART BLOOD GAS (G3+)
Bicarbonate: 26.1 meq/L — ABNORMAL HIGH (ref 20.0–24.0)
O2 Saturation: 96 %
TCO2: 27 mmol/L (ref 0–100)
pCO2 arterial: 44.6 mmHg (ref 35.0–45.0)
pH, Arterial: 7.376 (ref 7.350–7.450)
pO2, Arterial: 87 mmHg (ref 80.0–100.0)

## 2013-09-08 SURGERY — LEFT AND RIGHT HEART CATHETERIZATION WITH CORONARY ANGIOGRAM
Anesthesia: LOCAL

## 2013-09-08 MED ORDER — LIDOCAINE HCL (PF) 1 % IJ SOLN
INTRAMUSCULAR | Status: AC
  Filled 2013-09-08: qty 30

## 2013-09-08 MED ORDER — SODIUM CHLORIDE 0.9 % IV SOLN: Freq: Once | INTRAVENOUS | Status: DC

## 2013-09-08 MED ORDER — NITROGLYCERIN 1 MG/10 ML FOR IR/CATH LAB
INTRA_ARTERIAL | Status: AC
  Filled 2013-09-08: qty 10

## 2013-09-08 MED ORDER — SODIUM CHLORIDE 0.9 % IJ SOLN: 3.0000 mL | INTRAMUSCULAR | Status: DC | PRN

## 2013-09-08 MED ORDER — ASPIRIN 81 MG PO CHEW
81.0000 mg | CHEWABLE_TABLET | ORAL | Status: DC
  Filled 2013-09-08: qty 1

## 2013-09-08 MED ORDER — SODIUM CHLORIDE 0.9 % IV SOLN: INTRAVENOUS | Status: AC

## 2013-09-08 MED ORDER — HEPARIN (PORCINE) IN NACL 2-0.9 UNIT/ML-% IJ SOLN
INTRAMUSCULAR | Status: AC
  Filled 2013-09-08: qty 1000

## 2013-09-08 MED ORDER — MIDAZOLAM HCL 2 MG/2ML IJ SOLN
INTRAMUSCULAR | Status: AC
  Filled 2013-09-08: qty 2

## 2013-09-08 MED ORDER — SODIUM CHLORIDE 0.9 % IJ SOLN: 3.0000 mL | Freq: Two times a day (BID) | INTRAMUSCULAR | Status: DC

## 2013-09-08 MED ORDER — SODIUM CHLORIDE 0.9 % IV SOLN: 250.0000 mL | INTRAVENOUS | Status: DC | PRN

## 2013-09-08 MED ORDER — FENTANYL CITRATE 0.05 MG/ML IJ SOLN
INTRAMUSCULAR | Status: AC
  Filled 2013-09-08: qty 2

## 2013-09-08 NOTE — H&P (View-Only) (Signed)
Patient ID: [name], female   DOB: Jan 24, 1955, 58 y.o.   MRN: [id number]      Subjective:    SOB improving but not resolved  Objective:   Temp:  [98.2 F (36.8 C)-98.4 F (36.9 C)] 98.4 F (36.9 C) (08/30 0649) Pulse Rate:  [89-98] 93 (08/30 0649) Resp:  [18] 18 (08/30 0649) BP: (116-127)/(79-85) 116/84 mmHg (08/30 0649) SpO2:  [96 %-97 %] 97 % (08/30 0649) Weight:  [181 lb (82.101 kg)] 181 lb (82.101 kg) (08/30 0649) Last BM Date: 09/06/13  Filed Weights   09/06/13 0042 09/07/13 0649  Weight: 187 lb 3.2 oz (84.913 kg) 181 lb (82.101 kg)    Intake/Output Summary (Last 24 hours) at 09/07/13 0949 Last data filed at 09/07/13 0826  Gross per 24 hour  Intake    540 ml  Output   1900 ml  Net  -1360 ml    Telemetry: SR, LBBB  Exam:  General: NAD  Resp: faint crackles in bases  Cardiac: RRR, no m/r/g, no JVD  DP:OEUMPNT soft, NT, ND  MSK: no LE edema  Neuro: no focal deficits    Lab Results:  Basic Metabolic Panel:  Recent Labs Lab 09/05/13 1720 09/06/13 0115 09/06/13 0311 09/07/13 0335  NA 142  --  140 142  K 5.1  --  4.2 3.9  CL 107  --  103 100  CO2 23  --  22 27  GLUCOSE 114*  --  88 85  BUN 17  --  18 23  CREATININE 0.94 0.92 0.90 0.99  CALCIUM 10.0  --  10.2 10.9*  MG  --   --   --  2.2    Liver Function Tests:  Recent Labs Lab 09/05/13 1720  AST 56*  ALT 102*  ALKPHOS 89  BILITOT 0.3  PROT 7.3  ALBUMIN 3.8    CBC:  Recent Labs Lab 09/05/13 1720 09/06/13 0115  WBC 8.2 11.2*  HGB 13.1 14.0  HCT 40.1 42.6  MCV 91.1 90.4  PLT 202 230    Cardiac Enzymes:  Recent Labs Lab 09/06/13 0118 09/06/13 0705 09/06/13 1340  TROPONINI <0.30 <0.30 <0.30    BNP:  Recent Labs  09/05/13 1720  PROBNP 8605.0*    Coagulation: No results found for this basename: INR,  in the last 168 hours  ECG:   Medications:   Scheduled Medications: . aspirin EC  81 mg Oral Daily  . furosemide  40 mg Oral BID  . heparin  5,000  Units Subcutaneous 3 times per day  . levothyroxine  150 mcg Oral QAC breakfast  . polyethylene glycol  17 g Oral Daily  . sertraline  100 mg Oral Daily  . sodium chloride  3 mL Intravenous Q12H     Infusions:     PRN Medications:  sodium chloride, acetaminophen, morphine injection, ondansetron (ZOFRAN) IV, sodium chloride   08/2013 Echo  Study Conclusions  - Left ventricle: There is diffuse hypokinesis with akinesis of the entire anterior, basal and mid anterolateral walls and paradoxical septal motion. The cavity size was severely dilated. Systolic function was severely reduced. The estimated ejection fraction was in the range of 15-20%. Features are consistent with a pseudonormal left ventricular filling pattern, with concomitant abnormal relaxation and increased filling pressure (grade 2 diastolic dysfunction). Doppler parameters are consistent with elevated ventricular end-diastolic filling pressure. - Aortic valve: Trileaflet; normal thickness leaflets. There was no regurgitation. - Aortic root: The aortic root was normal in size. - Left atrium:  The atrium was mildly dilated. - Right ventricle: Systolic function was mildly reduced. - Right atrium: The atrium was normal in size. - Tricuspid valve: There was moderate regurgitation. - Pulmonary arteries: Systolic pressure was moderately to severely increased. PA peak pressure: 57 mm Hg (S).    Assessment/Plan    1. Acute systolic heart failure  - echo results back, LVEF 15-20% with severe LV dilatation, grade II diastolic dysfunction, moderate TR with PASP 57. TSH 4.830.  - negative 860 mL yesterday day, negative 3.2 liters since admission, renal function remains stable. Strong respone to IV lasix x1, she has been responding well to just oral lasix. - due to new diagnosis of severe LV systolic dysfunction, LBBB, and intermittent though atypical chest pain will plan for inpatient LHC/RHC. Her TSH is elevated mildly,  does not explain this degree of LV systolic dysfunction, she is on replacement.  - now more euvolemic start low dose Toprol XL 12.5mg  daily, likely start low dose ACE tomorrow.    2. Chest pain  - negative enzymes, EKG shows LBBB of unclear duration.  - symptoms somewhat atypical in that they are somewhat positional and reproducible.  - noted LBBB on EKG, unclear duration. If acute trops would have been positive by now  - plan for LHC as described above         Carlyle Dolly, M.D., F.A.C.C.

## 2013-09-08 NOTE — Progress Notes (Signed)
Pt sp cardiac cath and was hypotensive after procedure. Pt received fluid bolus before returning to the unit. BP is  now 104/84 and pt has Lasix and metoprolol ordered for this a.m. Notified Kerin Ransom, [place] on call and was advised to hold this dose of Lasix and give metoprolol. Will continue to monitor pt closely.  Eulis Canner, RN

## 2013-09-08 NOTE — CV Procedure (Signed)
      Cardiac Catheterization Operative Report  [name] [id number] 8/31/20159:38 AM No PCP Per Patient  Procedure Performed:  1. Left Heart Catheterization 2. Selective Coronary Angiography 3. Right Heart Catheterization  Operator: Lauree Chandler, MD  Indication: 58 yo female with admission for CHF, found to have severe LV systolic dysfunction. She has diuresed well over the weekend.                                  Procedure Details: The risks, benefits, complications, treatment options, and expected outcomes were discussed with the patient. The patient and/or family concurred with the proposed plan, giving informed consent. The patient was brought to the cath lab after IV hydration was begun and oral premedication was given. The patient was further sedated with Versed and Fentanyl. The right groin was prepped and draped in the usual manner. Using the modified Seldinger access technique, a 5 French sheath was placed in the right femoral artery. A 7 French sheath was inserted into the right femoral vein. A multi-purpose catheter was used to perform a right heart catheterization. Standard diagnostic catheters were used to perform selective coronary angiography. The JR4 was used to cross the aortic valve into the LV. LV pressures were measured. No LV gram performed. There were no immediate complications. The patient was taken to the recovery area in stable condition.   Hemodynamic Findings: Ao: 94/66               LV: 93/4/8 RA: 2            RV: 26/0/2 PA: 25/9 (mean 15)     PCWP:  9 Fick Cardiac Output: 4.6 L/min Fick Cardiac Index: 2.5 L/min/m2 Central Aortic Saturation: 96% Pulmonary Artery Saturation: 68%  Angiographic Findings:  Left main: No obstructive disease.   Left Anterior Descending Artery: Large caliber vessel that courses to the apex. There are two large caliber diagonal branches. No obstructive disease.   Circumflex Artery: Large caliber vessel with a  small caliber first obtuse marginal branch and a large caliber bifurcating second obtuse marginal branch. No obstructive disease.   Right Coronary Artery: Large dominant vessel with no obstructive disease.   Left Ventricular Angiogram: Deferred.   Impression: 1. No angiographic evidence of CAD 2. Normal filling pressures.   3. Non-ischemic cardiomyopathy  Recommendations: Continue medical management of her non-ischemic cardiomyopathy. Continue beta blocker. Would add Ace-inh as BP tolerates.        Complications:  None; patient tolerated the procedure well.

## 2013-09-08 NOTE — Progress Notes (Signed)
Nutrition Education Note  RD consulted for nutrition education regarding new onset CHF.  RD provided "Low Sodium Nutrition Therapy" handout from the [employer]. Reviewed patient's dietary recall. Provided examples on ways to decrease sodium intake in diet. Discouraged intake of processed foods and use of salt shaker. Encouraged fresh fruits and vegetables as well as whole grain sources of carbohydrates to maximize fiber intake.   RD discussed why it is important for patient to adhere to diet recommendations, and emphasized the role of fluids, foods to avoid, and importance of weighing self daily. Teach back method used. Pt able to identify many high sodium foods in her diet; bologna, instant noodles, cornbread, biscuits, and american cheese  Expect good compliance.  Body mass index is 31.76 kg/(m^2). Pt meets criteria for Obesity based on current BMI.  Current diet order is Heart Healthy, patient is consuming approximately 75% of meals at this time. Labs and medications reviewed. No further nutrition interventions warranted at this time. RD contact information provided. If additional nutrition issues arise, please re-consult RD.   Pryor Ochoa RD, LDN Inpatient Clinical Dietitian Pager: [phone] After Hours Pager: [phone]

## 2013-09-08 NOTE — Progress Notes (Signed)
PT Cancellation Note  Patient Details Name: [name] MRN: [id number] DOB: 06-14-1955   Cancelled Treatment:    Reason Eval/Treat Not Completed: Patient at procedure or test/unavailable.  Pt to Cath lab this am.  Will f/u another time.     Shaquira Moroz, Thornton Papas 09/08/2013, 8:50 AM

## 2013-09-08 NOTE — Progress Notes (Signed)
Site area: right groin Site Prior to Removal:  Level 0 Pressure Applied For: 20 minutes Manual:  yes  Patient Status During Pull:  Hypotensive; see comments Post Pull Site:  Level 0 Post Pull Instructions Given:  yes Post Pull Pulses Present: yes and remained present with doppler during sheath pull Dressing Applied:  Tegaderm Bedrest begins @ 10:25:00 Comments: Dr. Angelena Form aware of hypotension; patient remained asymptomatic. Orders received. Will continue to observe in holding area until BP stable x 30 minutes.

## 2013-09-08 NOTE — Interval H&P Note (Signed)
History and Physical Interval Note:  09/08/2013 8:51 AM  Otila Kluver  has presented today for cardiac cath with the diagnosis of cardiomyopathy, systolic CHF, shortness of breath  The various methods of treatment have been discussed with the patient and family. After consideration of risks, benefits and other options for treatment, the patient has consented to  Procedure(s): LEFT AND RIGHT HEART CATHETERIZATION WITH CORONARY ANGIOGRAM (N/A) as a surgical intervention .  The patient's history has been reviewed, patient examined, no change in status, stable for surgery.  I have reviewed the patient's chart and labs.  Questions were answered to the patient's satisfaction.    Cath Lab Visit (complete for each Cath Lab visit)  Clinical Evaluation Leading to the Procedure:   ACS: No.  Non-ACS:    Anginal Classification: CCS III  Anti-ischemic medical therapy: No Therapy  Non-Invasive Test Results: No non-invasive testing performed  Prior CABG: No previous CABG        MCALHANY,CHRISTOPHER

## 2013-09-08 NOTE — Progress Notes (Signed)
Pt returned from cath lab via stretcher. Family at bedside. Pt denies pain or concerns. Dressing to R groin CDI. Call bell placed in reach. Will continue to monitor pt closely.  Eulis Canner, RN

## 2013-09-09 LAB — BASIC METABOLIC PANEL
Anion gap: 12 (ref 5–15)
BUN: 28 mg/dL — AB (ref 6–23)
CHLORIDE: 99 meq/L (ref 96–112)
CO2: 27 mEq/L (ref 19–32)
CREATININE: 1.02 mg/dL (ref 0.50–1.10)
Calcium: 10.5 mg/dL (ref 8.4–10.5)
GFR calc Af Amer: 70 mL/min — ABNORMAL LOW (ref >90)
GFR calc non Af Amer: 60 mL/min — ABNORMAL LOW (ref >90)
Glucose, Bld: 101 mg/dL — ABNORMAL HIGH (ref 70–99)
Potassium: 4.8 mEq/L (ref 3.7–5.3)
Sodium: 138 mEq/L (ref 137–147)

## 2013-09-09 MED ORDER — LISINOPRIL 2.5 MG PO TABS
2.5000 mg | ORAL_TABLET | Freq: Every day | ORAL | Status: DC
  Administered 2013-09-09: 2.5 mg via ORAL
  Filled 2013-09-09 (×2): qty 1

## 2013-09-09 NOTE — Progress Notes (Signed)
   [employer] order was faxed to [place]. [employer] representative was notified.   Lyda Jester, PA-C

## 2013-09-09 NOTE — Progress Notes (Signed)
Patient Name: [name] Date of Encounter: 09/09/2013     Active Problems:   HF (heart failure)    SUBJECTIVE  Feeling back to her baseline. APpears evolemic. No CP, SOB, orthopnea or PND.  CURRENT MEDS . sodium chloride   Intravenous Once  . aspirin EC  81 mg Oral Daily  . furosemide  40 mg Oral BID  . levothyroxine  150 mcg Oral QAC breakfast  . metoprolol succinate  12.5 mg Oral Daily  . polyethylene glycol  17 g Oral Daily  . sertraline  100 mg Oral Daily  . sodium chloride  3 mL Intravenous Q12H    OBJECTIVE  Filed Vitals:   09/08/13 1511 09/08/13 1645 09/08/13 2036 09/09/13 0602  BP: 95/60 99/57 102/63 107/68  Pulse:   91 87  Temp:   97.8 F (36.6 C) 97.8 F (36.6 C)  TempSrc:   Oral Oral  Resp:   20 18  Height:      Weight:    179 lb 14.3 oz (81.6 kg)  SpO2: 97%  95% 96%    Intake/Output Summary (Last 24 hours) at 09/09/13 0803 Last data filed at 09/09/13 0700  Gross per 24 hour  Intake    360 ml  Output    750 ml  Net   -390 ml   Filed Weights   09/07/13 0649 09/08/13 0523 09/09/13 0602  Weight: 181 lb (82.101 kg) 179 lb 3.7 oz (81.3 kg) 179 lb 14.3 oz (81.6 kg)    PHYSICAL EXAM  General: Pleasant, NAD. Mildly overweight. Neuro: Alert and oriented X 3. Moves all extremities spontaneously. Psych: Normal affect. HEENT:  Normal  Neck: Supple without bruits or JVD. Lungs:  Resp regular and unlabored, CTA. Heart: RRR no s3, s4, or murmurs. Abdomen: Soft, non-tender, non-distended, BS + x 4.  Extremities: No clubbing, cyanosis or edema. DP/PT/Radials 2+ and equal bilaterally.  Accessory Clinical Findings  CBC  Basic Metabolic Panel  Recent Labs  09/07/13 0335 09/08/13 0713 09/09/13 0442  NA 142 141 138  K 3.9 4.2 4.8  CL 100 98 99  CO2 27 26 27   GLUCOSE 85 101* 101*  BUN 23 29* 28*  CREATININE 0.99 1.04 1.02  CALCIUM 10.9* 10.9* 10.5  MG 2.2  --   --     Cardiac Enzymes  Recent Labs  09/06/13 1340  TROPONINI <0.30     TELE  NSR with LBBB  Radiology/Studies  Dg Chest 2 View  09/05/2013   CLINICAL DATA:  Chest pain and shortness of breath. Wheezing. Difficulty breathing.  EXAM: CHEST  2 VIEW  COMPARISON:  Two-view chest 09/05/2013  FINDINGS: Heart is mildly enlarged without change. Mild pulmonary vascular congestion is evident. Small bilateral pleural effusions are now present. Bibasilar atelectasis is noted.  IMPRESSION: 1. Increasing pulmonary vascular congestion and small bilateral pleural effusions concerning for early congestive heart failure. 2. Bibasilar airspace disease likely reflects atelectasis.    Ct Angio Chest Pe W/cm &/or Wo Cm  09/05/2013   CLINICAL DATA:  Chest tightness and shortness of breath.  EXAM: CT ANGIOGRAPHY CHEST WITH CONTRAST  TECHNIQUE: Multidetector CT imaging of the chest was performed using the standard protocol during bolus administration of intravenous contrast. Multiplanar CT image reconstructions and MIPs were obtained to evaluate the vascular anatomy.  CONTRAST:  1101mL OMNIPAQUE IOHEXOL 350 MG/ML SOLN  COMPARISON:  Chest x-ray 09/05/2013.  FINDINGS: Limited exam due to patient's size and motion artifact. Thoracic aorta is non dilated. Severe cardiomegaly. No  pulmonary embolus identified.  No definite mediastinal or hilar adenopathy. Again this exam is limited due to patient's size and motion artifact. Thoracic esophagus is nondistended.  Large airways are patent. Diffuse interstitial prominence and small bilateral pleural effusions are present. These findings suggest congestive heart failure. Pneumonitis cannot be excluded.  Prior gastric surgery. 4.8 cm cyst central portion of the liver. 1 cm cyst periphery of the right lobe of the liver.  No significant axillary adenopathy. Chest wall is intact. No acute bony abnormality.  Review of the MIP images confirms the above findings.  IMPRESSION: 1. Limited exam due to patient's size and motion artifact. No pulmonary embolus  identified. 2. Findings suggesting congestive heart failure with pulmonary interstitial edema. Pneumonitis cannot be excluded    08/2013 Echo  Study Conclusions - Left ventricle: There is diffuse hypokinesis with akinesis of the entire anterior, basal and mid anterolateral walls and paradoxical septal motion. The cavity size was severely dilated. Systolic function was severely reduced. The estimated ejection fraction was in the range of 15-20%. Features are consistent with a pseudonormal left ventricular filling pattern, with concomitant abnormal relaxation and increased filling pressure (grade 2 diastolic dysfunction). Doppler parameters are consistent with elevated ventricular end-diastolic filling pressure. - Aortic valve: Trileaflet; normal thickness leaflets. There was no regurgitation. - Aortic root: The aortic root was normal in size. - Left atrium: The atrium was mildly dilated. - Right ventricle: Systolic function was mildly reduced. - Right atrium: The atrium was normal in size. - Tricuspid valve: There was moderate regurgitation. - Pulmonary arteries: Systolic pressure was moderately to severely increased. PA peak pressure: 57 mm Hg (S).   Cardiac Catheterization Operative Report  [name]  [id number]  8/31/20159:38 AM  No PCP Per Patient  Procedure Performed:  1. Left Heart Catheterization 2. Selective Coronary Angiography 3. Right Heart Catheterization Operator: Lauree Chandler, MD  Indication: 58 yo female with admission for CHF, found to have severe LV systolic dysfunction. She has diuresed well over the weekend.  Procedure Details:  The risks, benefits, complications, treatment options, and expected outcomes were discussed with the patient. The patient and/or family concurred with the proposed plan, giving informed consent. The patient was brought to the cath lab after IV hydration was begun and oral premedication was given. The patient was further sedated  with Versed and Fentanyl. The right groin was prepped and draped in the usual manner. Using the modified Seldinger access technique, a 5 French sheath was placed in the right femoral artery. A 7 French sheath was inserted into the right femoral vein. A multi-purpose catheter was used to perform a right heart catheterization. Standard diagnostic catheters were used to perform selective coronary angiography. The JR4 was used to cross the aortic valve into the LV. LV pressures were measured. No LV gram performed. There were no immediate complications. The patient was taken to the recovery area in stable condition.  Hemodynamic Findings:  Ao: 94/66  LV: 93/4/8  RA: 2  RV: 26/0/2  PA: 25/9 (mean 15)  PCWP: 9  Fick Cardiac Output: 4.6 L/min  Fick Cardiac Index: 2.5 L/min/m2  Central Aortic Saturation: 96%  Pulmonary Artery Saturation: 68%  Angiographic Findings:  Left main: No obstructive disease.  Left Anterior Descending Artery: Large caliber vessel that courses to the apex. There are two large caliber diagonal branches. No obstructive disease.  Circumflex Artery: Large caliber vessel with a small caliber first obtuse marginal branch and a large caliber bifurcating second obtuse marginal branch. No obstructive disease.  Right Coronary Artery: Large dominant vessel with no obstructive disease.  Left Ventricular Angiogram: Deferred.  Impression:  1. No angiographic evidence of CAD  2. Normal filling pressures.  3. Non-ischemic cardiomyopathy  Recommendations: Continue medical management of her non-ischemic cardiomyopathy. Continue beta blocker. Would add Ace-inh as BP tolerates.  Complications: None; patient tolerated the procedure well.    ASSESSMENT AND PLAN  [name] is a 58 y.o. female with a history of asthma, hypothyroidism, obesity, depression and fibromyalgia who presented to the [facility] ED on 09/05/13 with SOB/DOE/chest pressure/orthopnea/PND and found to be in new onset acute CHF  exacerbation and LBBB of unknown duration. She was admitted for further work up.   Acute systolic heart failure/ Non-ischemic CM -- 2D ECHO on 09/06/13 w/ LVEF 15-20% with severe hypokinesis and LV dilatation, grade II diastolic dysfunction, moderate TR with PASP 57.  -- Strong respone to IV lasix x1, she has been responding well to just oral lasix. Net negative 4.1L. Weight down 7 lbs.  Renal function remains stable. Currently on Lasix 40mg  po BID. -- Due to new diagnosis of severe LV systolic dysfunction, LBBB, and intermittent though atypical chest pain she underwent LHC/RHC yesterday which revealed no CAD -- Started on Toprol XL 12.5mg  daily. Consider adding very low dose ACE today or tomorrow. BP 107/68.   Chest pain  -- Negative enzymes, EKG shows LBBB of unclear duration.  -- s/p LHC 09/08/13 which revealed   1. No angiographic evidence of CAD   2. Normal filling pressures.   3. Non-ischemic cardiomyopathy   Hypothyroidism -- TSH 4.830. Her TSH is elevated mildly, does not explain this degree of LV systolic dysfunction, she is on replacement.    Judy Pimple PA-C  Pager [phone]    Patient seen and examined. Agree with assessment and plan. By history pt admits to a several month decline with progressive dyspnea. No URI or viral like syndrome. Cath data reviewed. Now on very low dose metoprolol succinate; will add lisinopril today initially at 2.5 mg. Will need life-vest for discharge for several months while medical therapy is titrated to see if EF improves, and if not ICD therapy.   Troy Sine, MD, [facility] 09/09/2013 9:07 AM

## 2013-09-10 ENCOUNTER — Encounter (HOSPITAL_COMMUNITY): Payer: Self-pay | Admitting: Cardiology

## 2013-09-10 DIAGNOSIS — E039 Hypothyroidism, unspecified: Secondary | ICD-10-CM | POA: Diagnosis present

## 2013-09-10 DIAGNOSIS — Z9889 Other specified postprocedural states: Secondary | ICD-10-CM

## 2013-09-10 DIAGNOSIS — Z789 Other specified health status: Secondary | ICD-10-CM

## 2013-09-10 DIAGNOSIS — R079 Chest pain, unspecified: Secondary | ICD-10-CM | POA: Diagnosis present

## 2013-09-10 DIAGNOSIS — I428 Other cardiomyopathies: Secondary | ICD-10-CM

## 2013-09-10 DIAGNOSIS — N182 Chronic kidney disease, stage 2 (mild): Secondary | ICD-10-CM | POA: Diagnosis present

## 2013-09-10 DIAGNOSIS — I5043 Acute on chronic combined systolic (congestive) and diastolic (congestive) heart failure: Principal | ICD-10-CM

## 2013-09-10 DIAGNOSIS — Z9189 Other specified personal risk factors, not elsewhere classified: Secondary | ICD-10-CM | POA: Diagnosis present

## 2013-09-10 LAB — PRO B NATRIURETIC PEPTIDE: Pro B Natriuretic peptide (BNP): 2408 pg/mL — ABNORMAL HIGH (ref 0–125)

## 2013-09-10 LAB — BASIC METABOLIC PANEL
Anion gap: 10 (ref 5–15)
BUN: 32 mg/dL — AB (ref 6–23)
CHLORIDE: 100 meq/L (ref 96–112)
CO2: 31 mEq/L (ref 19–32)
Calcium: 10.7 mg/dL — ABNORMAL HIGH (ref 8.4–10.5)
Creatinine, Ser: 1.1 mg/dL (ref 0.50–1.10)
GFR calc Af Amer: 64 mL/min — ABNORMAL LOW (ref >90)
GFR calc non Af Amer: 55 mL/min — ABNORMAL LOW (ref >90)
Glucose, Bld: 99 mg/dL (ref 70–99)
Potassium: 4.8 mEq/L (ref 3.7–5.3)
Sodium: 141 mEq/L (ref 137–147)

## 2013-09-10 MED ORDER — POLYETHYLENE GLYCOL 3350 17 G PO PACK: 17.0000 g | PACK | Freq: Every day | ORAL | Status: DC | PRN

## 2013-09-10 MED ORDER — ASPIRIN 81 MG PO TBEC: 81.0000 mg | DELAYED_RELEASE_TABLET | Freq: Every day | ORAL | Status: AC

## 2013-09-10 MED ORDER — METOPROLOL SUCCINATE ER 25 MG PO TB24: 12.5000 mg | ORAL_TABLET | Freq: Every day | ORAL | Status: DC

## 2013-09-10 MED ORDER — ACETAMINOPHEN 325 MG PO TABS: 650.0000 mg | ORAL_TABLET | Freq: Four times a day (QID) | ORAL | Status: DC | PRN

## 2013-09-10 MED ORDER — LISINOPRIL 2.5 MG PO TABS: 2.5000 mg | ORAL_TABLET | Freq: Every day | ORAL | Status: DC

## 2013-09-10 MED ORDER — FUROSEMIDE 40 MG PO TABS: 40.0000 mg | ORAL_TABLET | Freq: Two times a day (BID) | ORAL | Status: DC

## 2013-09-10 MED ORDER — LISINOPRIL 2.5 MG PO TABS
2.5000 mg | ORAL_TABLET | Freq: Every day | ORAL | Status: DC
  Filled 2013-09-10: qty 1

## 2013-09-10 NOTE — Care Management Note (Signed)
    Page 1 of 1   09/10/2013     2:39:06 PM CARE MANAGEMENT NOTE 09/10/2013  Patient:  [name], [name]   Account Number:  [id number]  Date Initiated:  09/10/2013  Documentation initiated by:  Fuller Mandril  Subjective/Objective Assessment:   58 yo female with h/o asthma who presented to ER with SOB/DOE/Chest pressure/Orthopnea/PND.//Home alone     Action/Plan:   IV diuretics; LEFT AND RIGHT HEART CATHETERIZATION WITH CORONARY ANGIOGRAM.//Access for disposition needs   Anticipated DC Date:  09/10/2013   Anticipated DC Plan:  [place]  CM consult  PCP issues      PAC Choice  DURABLE MEDICAL EQUIPMENT   Choice offered to / List presented to:     DME arranged  VEST - LIFE VEST           Status of service:   Medicare Important Message given?   (If response is "NO", the following Medicare IM given date fields will be blank) Date Medicare IM given:   Medicare IM given by:   Date Additional Medicare IM given:   Additional Medicare IM given by:    Discharge Disposition:  HOME/SELF CARE  Per UR Regulation:  Reviewed for med. necessity/level of care/duration of stay  If discussed at Blue of Stay Meetings, dates discussed:    Comments:  09/10/13 Crane, Bells, BSN, [place] [phone] Plans to discharge to home alone with LifeVest Nadean Corwin from [place] in to speak with pt this am) and have 1 week transition to outpatient follow-up with the Advanced HF clinic initially then back to [facility] and Dr. Harl Bowie.  NCM set-up PCP appt with Dr Woody Seller in [place].

## 2013-09-10 NOTE — Progress Notes (Signed)
[name] is a 58 y.o. female with a history of asthma, hypothyroidism, obesity, depression and fibromyalgia who presented to the [facility] ED on 09/05/13 with SOB/DOE/chest pressure/orthopnea/PND and found to be in new onset acute CHF exacerbation and LBBB of unknown duration. She was admitted for further work up    Subjective: No complaints, a little DOE with shower  Objective: Vital signs in last 24 hours: Temp:  [97.5 F (36.4 C)-98.2 F (36.8 C)] 98.2 F (36.8 C) (09/02 0635) Pulse Rate:  [78-88] 78 (09/02 0635) Resp:  [17-20] 18 (09/02 0635) BP: (95-109)/(54-70) 98/56 mmHg (09/02 0635) SpO2:  [93 %-98 %] 93 % (09/02 0635) Weight:  [179 lb 14.3 oz (81.6 kg)] 179 lb 14.3 oz (81.6 kg) (09/02 0635) Weight change: 0 lb (0 kg) Last BM Date: 09/09/13 Intake/Output from previous day: +30 (-3877 since admit) wt down from 187 to 179 lbs. 09/01 0701 - 09/02 0700 In: 1030 [P.O.:1030] Out: 750 [Urine:750] Intake/Output this shift:    PE: General:Pleasant affect, NAD Skin:Warm and dry, brisk capillary refill HEENT:normocephalic, sclera clear, mucus membranes moist Neck:supple, no JVD   Heart:S1S2 RRR without murmur, gallup, rub or click Lungs:clear without rales, rhonchi, or wheezes BMW:UXLK, non tender, + BS, do not palpate liver spleen or masses Ext:no lower ext edema, 2+ pedal pulses, 2+ radial pulses, cath site with mild bruising no hematoma Neuro:alert and oriented, MAE, follows commands, + facial symmetry  TELE: SR with LBBB  Lab Results: No results found for this basename: WBC, HGB, HCT, PLT,  in the last 72 hours BMET  Recent Labs  09/09/13 0442 09/10/13 0645  NA 138 141  K 4.8 4.8  CL 99 100  CO2 27 31  GLUCOSE 101* 99  BUN 28* 32*  CREATININE 1.02 1.10  CALCIUM 10.5 10.7*   No results found for this basename: TROPONINI, CK, MB,  in the last 72 hours  No results found for this basename: CHOL,  HDL,  LDLCALC,  LDLDIRECT,  TRIG,  CHOLHDL   No results  found for this basename: HGBA1C     Lab Results  Component Value Date   TSH 4.830* 09/06/2013      Studies/Results: No results found.  Medications: I have reviewed the patient's current medications. Scheduled Meds: . sodium chloride   Intravenous Once  . aspirin EC  81 mg Oral Daily  . furosemide  40 mg Oral BID  . levothyroxine  150 mcg Oral QAC breakfast  . lisinopril  2.5 mg Oral Daily  . metoprolol succinate  12.5 mg Oral Daily  . polyethylene glycol  17 g Oral Daily  . sertraline  100 mg Oral Daily  . sodium chloride  3 mL Intravenous Q12H   Continuous Infusions:  PRN Meds:.sodium chloride, acetaminophen, morphine injection, ondansetron (ZOFRAN) IV, sodium chloride  Assessment/Plan: Acute systolic heart failure/ Non-ischemic CM  -- 2D ECHO on 09/06/13 w/ LVEF 15-20% with severe hypokinesis and LV dilatation, grade II diastolic dysfunction, moderate TR with PASP 57.  -- Strong response to IV lasix x1, she has been responding well to just oral lasix. Net negative 4.1L. Weight down 7 lbs. Renal function remains stable. Currently on Lasix 40mg  po BID.  -- Due to new diagnosis of severe LV systolic dysfunction, LBBB, and intermittent though atypical chest pain she underwent LHC/RHC yesterday which revealed no CAD  -- Started on Toprol XL 12.5mg  daily. Consider adding very low dose ACE yesterday  BP 107/65 to 95/54.  -lifevest rep aware  of order Chest pain  -- Negative enzymes, EKG shows LBBB of unclear duration.  -- s/p LHC 09/08/13 which revealed  1. No angiographic evidence of CAD  2. Normal filling pressures.  3. Non-ischemic cardiomyopathy   Hypothyroidism  -- TSH 4.830. Her TSH is elevated mildly, does not explain this degree of LV systolic dysfunction, she is on replacement.    Principal Problem:   Acute systolic  And diastolic HF (heart failure) class III on admit Active Problems:   NICM (nonischemic cardiomyopathy), EF 15-20%   At risk for sudden cardiac death,  life vest    Chest pain at rest, secondary to CHF- see above   S/P cardiac cath, no evidence of CAD 09/08/13   Hypothyroidism  Possible d/c later today or in AM    LOS: 5 days   Time spent with pt. :15 minutes. [facility] R  Nurse Practitioner Certified Pager [phone] or after 5pm and on weekends call [phone] 09/10/2013, 8:13 AM    Patient seen and examined. Agree with assessment and plan. Feels well; no chest pain or shortness of breath. Tolerating initiation of low dose BB and ACE-I. Will need to titrate as outpatient. Discussed life-vest with her. Plan to place today. Ambulate, cardiac rehab, possible dc later today is stable with f/u with Dr. Harl Bowie in [place].   Troy Sine, MD, [facility] 09/10/2013 8:42 AM

## 2013-09-10 NOTE — Progress Notes (Signed)
CARDIAC REHAB PHASE I   PRE:  Rate/Rhythm: 80 SR  BP:  Supine:   Sitting: 103/54  Standing:    SaO2: 99%RA  MODE:  Ambulation: 460 ft   POST:  Rate/Rhythm: 87 SR  BP:  Supine:   Sitting: 97/60  Standing:    SaO2: 99-100%RA  1305-1350 Pt walked 460 ft with hand held asst with steady gait. Tolerated well. Tired by end of walk. No dizziness. Reviewed CHF ed and pt able to answer teach back questions. Gave pt exercise ed. Discussed CRP 2 and pt will discuss with cardiologist later when she can drive if interested at that time. Put on lifevest video for pt to view.   Graylon Good, RN BSN  09/10/2013 1:45 PM

## 2013-09-10 NOTE — Progress Notes (Signed)
Pt is very anxious about going home late in the day.  Her vest will be fitted after 5:30 pm and she does not wish to go home that late, we will discharge early AM tomorrow.  Also we did change her BP meds due to hypotension, this will give [name] a chance to evaluate.

## 2013-09-10 NOTE — Progress Notes (Addendum)
Heart Failure Navigator Consult Note  Presentation: [name] is a 58 yo female with h/o asthma who presented to ER with SOB/DOE/Chest pressure/Orthopnea/PND. Patient states that her symptoms first started about 2 months ago when she became quickly fatigued with decreased functional status. The symptoms gradually worsened . She started to have markedly SOB with orthopnea, PND and chest pressure. She went to urgent care where an ECG reveal LBBB and she was sent to [facility] ER.  She denied any significant chest pain or syncope.  She is former smoker but quit 25 yrs ago. She does not abuse EtOH.   Past Medical History  Diagnosis Date  . Asthma   . Bronchitis   . PNA (pneumonia)   . Thyroid disease   . Fibromyalgia   . Depression   . Anxiety   . CHF (congestive heart failure)   . At risk for sudden cardiac death, life vest  2013/09/16  . Acute systolic HF (heart failure) 09/05/2013  . NICM (nonischemic cardiomyopathy), EF 15-20% 16-Sep-2013  . Chest pain at rest, secondary to CHF 09/16/13  . Hypothyroidism 09-16-13    History   Social History  . Marital Status: Widowed    Spouse Name: N/A    Number of Children: N/A  . Years of Education: N/A   Social History Main Topics  . Smoking status: Former Smoker    Types: Cigarettes  . Smokeless tobacco: Never Used  . Alcohol Use: Yes     Comment: occasionally  . Drug Use: No  . Sexual Activity: None   Other Topics Concern  . None   Social History Narrative  . None    ECHO:Impressions:--09/06/13  - Severe dilatation of the left ventricle with severely decreased LVEF 15-20%. Elevated filling pressures. Regional wall motion abnormalities consistent with LBBB and suggestive of infarct in LAD territory. Moderate mitral and tricuspid regurgitation. Mild right ventricular systolic impairement and moderate to severe pulmonary hypertension.  Transthoracic echocardiography. M-mode, complete 2D, spectral Doppler, and color Doppler.  Birthdate: Patient birthdate: 15-Mar-1955. Age: Patient is 58 yr old. Sex: Gender: female. BMI: 33.2 kg/m^2. Blood pressure: 127/85 Patient status: Inpatient. Study date: Study date: 09/06/2013. Study time: 02:41 PM. Location: Bedside.   Cardiac Catheterization--09/08/13 Hemodynamic Findings:  Ao: 94/66  LV: 93/4/8  RA: 2  RV: 26/0/2  PA: 25/9 (mean 15)  PCWP: 9  Fick Cardiac Output: 4.6 L/min  Fick Cardiac Index: 2.5 L/min/m2  Central Aortic Saturation: 96%  Pulmonary Artery Saturation: 68%  Angiographic Findings:  Left main: No obstructive disease.  Left Anterior Descending Artery: Large caliber vessel that courses to the apex. There are two large caliber diagonal branches. No obstructive disease.  Circumflex Artery: Large caliber vessel with a small caliber first obtuse marginal branch and a large caliber bifurcating second obtuse marginal branch. No obstructive disease.  Right Coronary Artery: Large dominant vessel with no obstructive disease.  Left Ventricular Angiogram: Deferred.  Impression:  1. No angiographic evidence of CAD  2. Normal filling pressures.  3. Non-ischemic cardiomyopathy  Recommendations: Continue medical management of her non-ischemic cardiomyopathy. Continue beta blocker. Would add Ace-inh as BP tolerates.   BNP    Component Value Date/Time   PROBNP 2408.0* Sep 16, 2013 0645    Education Assessment and Provision:  Detailed education and instructions provided on heart failure disease management including the following:  Signs and symptoms of Heart Failure When to call the physician Importance of daily weights Low sodium diet Fluid restriction Medication management Anticipated future follow-up appointments  Patient education  given on each of the above topics.  Patient acknowledges understanding and acceptance of all instructions.  I spoke at length with [name] regarding her HF diagnosis.  This is all new to her and she has never had any "heart  problems" before.  She is able to teach back all topics listed above.  She is very motivated and asked many pertinent questions.  She will have a scale at discharge and understands when to call the physician.    Education Materials:  "Living Better With Heart Failure" Booklet, Daily Weight Tracker Tool    High Risk Criteria for Readmission and/or Poor Patient Outcomes:  (Recommend Follow-up with Advanced Heart Failure Clinic)--yes for transition and then back to [facility] with Dr. Harl Bowie   EF <30%- Yes-15-20%  2 or more admissions in 6 months- No  Difficult social situation- No  Demonstrates medication noncompliance- No   Barriers of Care:   Knowledge , compliance  Discharge Planning:    Plans to discharge to home alone and have 1 week transition to outpatient follow-up with the Advanced HF clinic initially then back to [facility] and Dr. Harl Bowie.

## 2013-09-10 NOTE — Discharge Summary (Addendum)
Physician Discharge Summary       Patient ID: [name] MRN: [id number] DOB/AGE: [birth year] 56 y.o.  Admit date: 09/05/2013 Discharge date: 09/11/2013  Discharge Diagnoses:  Principal Problem:   Acute on chronic combined systolic and diastolic HF (heart failure), NYHA class 3 Active Problems:   NICM (nonischemic cardiomyopathy), EF 15-20%   At risk for sudden cardiac death, life vest    Chest pain at rest, secondary to CHF   S/P cardiac cath, no evidence of CAD 09/08/13   Hypothyroidism   CKD (chronic kidney disease) stage 2, GFR 60-89 ml/min   Discharged Condition: good  Procedures: 09/08/13 cardiac cath Rt and Lt by Dr. Angelena Form   PRIMARY CARDIOLOGIST:  Dr. Harl Bowie   [facility] Course: 58 yo female with h/o asthma who presented to ER with SOB/DOE/Chest pressure/Orthopnea/PND. Patient states that her symptoms first started about 2 months ago when she became quickly fatigued with decreased functional status. The symptoms gradually worsened . She started to have markedly SOB with orthopnea, PND and chest pressure. She went to urgent care where an ECG reveal LBBB and she was sent to [facility] ER.  She denied any significant chest pain or syncope.  She was given one dose of IV lasix with good diureses then placed on PO. Negative Troponin.  Echo revealed EF of 15-20%.   She had Rt and Lt heart cath to rule out ischemia with LBBB and abnormal Echo. Results below, but no CAD. NICM.    Meds have been adjusted now on BB low dose, ACE but with lower BP we will give at HS and BB in AM.  Also on lasix daily.  She ambulated in the hall without complications and was ready for discharge.  Life Vest was ordered with high risk for sudden death.  She was seen and found stable for discharge by Dr. Claiborne Billings and will follow up as noted.  She will need meds titrated and then repeat Echo, if EF still low ICD therapy will be discussed.    I&O this admit  -3,877.  Weight down from 187 on admit to 179  lbs.  PLEASE NOTE at discharge she is now on only lasix 40 mg once a day.   Consults: cardiology  Significant Diagnostic Studies:  BMET    Component Value Date/Time   NA 139 09/11/2013 0348   K 4.4 09/11/2013 0348   CL 98 09/11/2013 0348   CO2 29 09/11/2013 0348   GLUCOSE 101* 09/11/2013 0348   BUN 35* 09/11/2013 0348   CREATININE 1.16* 09/11/2013 0348   CALCIUM 10.5 09/11/2013 0348   GFRNONAA 52* 09/11/2013 0348   GFRAA 60* 09/11/2013 0348    CBC    Component Value Date/Time   WBC 11.2* 09/06/2013 0115   RBC 4.71 09/06/2013 0115   HGB 14.0 09/06/2013 0115   HCT 42.6 09/06/2013 0115   PLT 230 09/06/2013 0115   MCV 90.4 09/06/2013 0115   MCH 29.7 09/06/2013 0115   MCHC 32.9 09/06/2013 0115   RDW 14.5 09/06/2013 0115   LYMPHSABS 1.5 09/05/2013 1720   MONOABS 0.4 09/05/2013 1720   EOSABS 0.0 09/05/2013 1720   BASOSABS 0.0 09/05/2013 1720    BNP (last 3 results)  Recent Labs  09/05/13 1720 09/10/13 0645  PROBNP 8605.0* 2408.0*    Troponin <0.30 X 3 TSH: 4.830 on meds  2D Echo: Left ventricle: There is diffuse hypokinesis with akinesis of the entire anterior, basal and mid anterolateral walls and paradoxical septal motion. The cavity  size was severely dilated. Systolic function was severely reduced. The estimated ejection fraction was in the range of 15-20%. Features are consistent with a pseudonormal left ventricular filling pattern, with concomitant abnormal relaxation and increased filling pressure (grade 2 diastolic dysfunction). Doppler parameters are consistent with elevated ventricular end-diastolic filling pressure. - Aortic valve: Trileaflet; normal thickness leaflets. There was no regurgitation. - Aortic root: The aortic root was normal in size. - Left atrium: The atrium was mildly dilated. - Right ventricle: Systolic function was mildly reduced. - Right atrium: The atrium was normal in size. - Tricuspid valve: There was moderate regurgitation. - Pulmonary arteries: Systolic  pressure was moderately to severely increased. PA peak pressure: 57 mm Hg (S).  Impressions:  - Severe dilatation of the left ventricle with severely decreased LVEF 15-20%. Elevated filling pressures. Regional wall motion abnormalities consistent with LBBB and suggestive of infarct in LAD territory. Moderate mitral and tricuspid regurgitation. Mild right ventricular systolic impairement and moderate to severe pulmonary hypertension  EKG:  Normal sinus rhythm Left bundle branch block Abnormal ECG Since last tracing rate slower   Cardiac Cath: Hemodynamic Findings:  Ao: 94/66  LV: 93/4/8  RA: 2  RV: 26/0/2  PA: 25/9 (mean 15)  PCWP: 9  Fick Cardiac Output: 4.6 L/min  Fick Cardiac Index: 2.5 L/min/m2  Central Aortic Saturation: 96%  Pulmonary Artery Saturation: 68%  Angiographic Findings:  Left main: No obstructive disease.  Left Anterior Descending Artery: Large caliber vessel that courses to the apex. There are two large caliber diagonal branches. No obstructive disease.  Circumflex Artery: Large caliber vessel with a small caliber first obtuse marginal branch and a large caliber bifurcating second obtuse marginal branch. No obstructive disease.  Right Coronary Artery: Large dominant vessel with no obstructive disease.  Left Ventricular Angiogram: Deferred.  Impression:  1. No angiographic evidence of CAD  2. Normal filling pressures.  3. Non-ischemic cardiomyopathy   CT chest ANGIO: IMPRESSION:  1. Limited exam due to patient's size and motion artifact. No  pulmonary embolus identified.  2. Findings suggesting congestive heart failure with pulmonary  interstitial edema. Pneumonitis cannot be excluded .   CHEST 2 VIEW  COMPARISON: Two-view chest 09/05/2013  FINDINGS:  Heart is mildly enlarged without change. Mild pulmonary vascular  congestion is evident. Small bilateral pleural effusions are now  present. Bibasilar atelectasis is noted.  IMPRESSION:  1.  Increasing pulmonary vascular congestion and small bilateral  pleural effusions concerning for early congestive heart failure.  2. Bibasilar airspace disease likely reflects atelectasis.    Discharge Exam: Blood pressure 111/61, pulse 77, temperature 97.6 F (36.4 C), temperature source Oral, resp. rate 18, height 5\' 3"  (1.6 m), weight 178 lb 9.6 oz (81.012 kg), SpO2 95.00%.  Disposition: HOME      Medication List    STOP taking these medications       azithromycin 250 MG tablet  Commonly known as:  ZITHROMAX     predniSONE 20 MG tablet  Commonly known as:  DELTASONE      TAKE these medications       acetaminophen 325 MG tablet  Commonly known as:  TYLENOL  Take 2 tablets (650 mg total) by mouth every 6 (six) hours as needed for mild pain or moderate pain.     albuterol 108 (90 BASE) MCG/ACT inhaler  Commonly known as:  PROVENTIL HFA;VENTOLIN HFA  Inhale 1 puff into the lungs every 6 (six) hours as needed for wheezing or shortness of breath.  aspirin 81 MG EC tablet  Take 1 tablet (81 mg total) by mouth daily.     cyclobenzaprine 10 MG tablet  Commonly known as:  FLEXERIL  Take 10 mg by mouth 3 (three) times daily as needed for muscle spasms.     furosemide 40 MG tablet  Commonly known as:  LASIX  Take 1 tablet (40 mg total) by mouth 2 (two) times daily.     furosemide 40 MG tablet  Commonly known as:  LASIX  Take 1 tablet (40 mg total) by mouth daily.  Start taking on:  09/12/2013     gabapentin 300 MG capsule  Commonly known as:  NEURONTIN  Take 300 mg by mouth daily.     HYDROcodone-acetaminophen 5-325 MG per tablet  Commonly known as:  NORCO/VICODIN  Take 1 tablet by mouth every 6 (six) hours as needed for moderate pain.     levothyroxine 150 MCG tablet  Commonly known as:  SYNTHROID, LEVOTHROID  Take 150 mcg by mouth daily before breakfast.     lisinopril 2.5 MG tablet  Commonly known as:  PRINIVIL,ZESTRIL  Take 1 tablet (2.5 mg total) by mouth  at bedtime.     metoprolol succinate 25 MG 24 hr tablet  Commonly known as:  TOPROL-XL  Take 0.5 tablets (12.5 mg total) by mouth daily.     polyethylene glycol packet  Commonly known as:  MIRALAX / GLYCOLAX  Take 17 g by mouth daily as needed for moderate constipation.     sertraline 100 MG tablet  Commonly known as:  ZOLOFT  Take 100 mg by mouth daily.       Follow-up Information   Follow up with VYAS,DHRUV B., MD On 09/17/2013. (arrive @2 :15; appt @2 :45)    Specialty:  Internal Medicine   Contact information:   [place] [address] 336 [phone]       Follow up with Rande Brunt, NP On 09/17/2013. (at 8:15am in the [facility] --bring all medications--code 0050)    Specialty:  Nurse Practitioner   Contact information:   [address] [place] [address] [phone]       Follow up with Arnoldo Lenis, MD On 09/25/2013. (at 3:40 pm)    Specialty:  Cardiology   Contact information:   [place] [address] [phone]        Discharge Instructions: Weigh daily Call [phone] if weight climbs more than 3 pounds in a day or 5 pounds in a week. No salt to very little salt in your diet.  No more than 2000 mg in a day. Call if increased shortness of breath or increased swelling.   wear life vest  Follow up as instructed.  Only Lasix 40 mg once a day.  Signed: Isaiah Serge Nurse Practitioner-Certified [facility]: [facility] 09/11/2013, 8:10 AM  Time spent on discharge : >35 minutes.

## 2013-09-11 DIAGNOSIS — N182 Chronic kidney disease, stage 2 (mild): Secondary | ICD-10-CM

## 2013-09-11 LAB — BASIC METABOLIC PANEL
Anion gap: 12 (ref 5–15)
BUN: 35 mg/dL — ABNORMAL HIGH (ref 6–23)
CO2: 29 mEq/L (ref 19–32)
Calcium: 10.5 mg/dL (ref 8.4–10.5)
Chloride: 98 mEq/L (ref 96–112)
Creatinine, Ser: 1.16 mg/dL — ABNORMAL HIGH (ref 0.50–1.10)
GFR calc Af Amer: 60 mL/min — ABNORMAL LOW (ref >90)
GFR calc non Af Amer: 52 mL/min — ABNORMAL LOW (ref >90)
Glucose, Bld: 101 mg/dL — ABNORMAL HIGH (ref 70–99)
POTASSIUM: 4.4 meq/L (ref 3.7–5.3)
Sodium: 139 mEq/L (ref 137–147)

## 2013-09-11 MED ORDER — FUROSEMIDE 40 MG PO TABS: 40.0000 mg | ORAL_TABLET | Freq: Every day | ORAL | Status: DC

## 2013-09-11 NOTE — Progress Notes (Signed)
Patient discharged, all instructions given, all questions answered. Patient has no complaints, no signs and symptoms of distress. DC IV, DC Tele.

## 2013-09-11 NOTE — Discharge Instructions (Signed)
Weigh daily Call [phone] if weight climbs more than 3 pounds in a day or 5 pounds in a week. No salt to very little salt in your diet.  No more than 2000 mg in a day. Call if increased shortness of breath or increased swelling.   wear life vest  Follow up as instructed.  PLEASE NOTE YOUR LASIX IS ONLY ONCE A DAY THOUGH PRESCRIPTION IS FOR TWICE A DAY- WE DECREASED DAY OF DISCHARGE.

## 2013-09-11 NOTE — Progress Notes (Signed)
Subjective: No complaints, rec'd life vest  Objective: Vital signs in last 24 hours: Temp:  [97.5 F (36.4 C)-98 F (36.7 C)] 97.6 F (36.4 C) (09/03 0625) Pulse Rate:  [70-84] 77 (09/03 0625) Resp:  [14-18] 18 (09/03 0625) BP: (83-111)/(51-65) 111/61 mmHg (09/03 0625) SpO2:  [95 %-99 %] 95 % (09/03 0625) Weight:  [178 lb 9.6 oz (81.012 kg)] 178 lb 9.6 oz (81.012 kg) (09/03 0625) Weight change: -1 lb 4.7 oz (-0.588 kg) Last BM Date: 09/10/13 Intake/Output from previous day: +587 wt slowly decreasing despite + I&O 09/02 0701 - 09/03 0700 In: 840 [P.O.:840] Out: 253 [Urine:252; Stool:1] Intake/Output this shift:    PE: General:Pleasant affect, NAD Skin:Warm and dry, brisk capillary refill HEENT:normocephalic, sclera clear, mucus membranes moist Neck:supple, no JVD, no bruits  Heart:S1S2 RRR without murmur, gallup, rub or click Lungs:clear without rales, rhonchi, or wheezes MBW:GYKZ, non tender, + BS, do not palpate liver spleen or masses Ext:no lower ext edema, 2+ pedal pulses, 2+ radial pulses Neuro:alert and oriented, MAE, follows commands, + facial symmetry   Lab Results: No results found for this basename: WBC, HGB, HCT, PLT,  in the last 72 hours BMET  Recent Labs  09/10/13 0645 09/11/13 0348  NA 141 139  K 4.8 4.4  CL 100 98  CO2 31 29  GLUCOSE 99 101*  BUN 32* 35*  CREATININE 1.10 1.16*  CALCIUM 10.7* 10.5   No results found for this basename: TROPONINI, CK, MB,  in the last 72 hours  No results found for this basename: CHOL, HDL, LDLCALC, LDLDIRECT, TRIG, CHOLHDL   No results found for this basename: HGBA1C     Lab Results  Component Value Date   TSH 4.830* 09/06/2013      Studies/Results: No results found.  Medications: I have reviewed the patient's current medications. Scheduled Meds: . sodium chloride   Intravenous Once  . aspirin EC  81 mg Oral Daily  . furosemide  40 mg Oral BID  . levothyroxine  150 mcg Oral QAC  breakfast  . lisinopril  2.5 mg Oral QHS  . metoprolol succinate  12.5 mg Oral Daily  . polyethylene glycol  17 g Oral Daily  . sertraline  100 mg Oral Daily  . sodium chloride  3 mL Intravenous Q12H   Continuous Infusions:  PRN Meds:.sodium chloride, acetaminophen, morphine injection, ondansetron (ZOFRAN) IV, sodium chloride  Assessment/Plan: Acute systolic heart failure/ Non-ischemic CM  -- 2D ECHO on 09/06/13 w/ LVEF 15-20% with severe hypokinesis and LV dilatation, grade II diastolic dysfunction, moderate TR with PASP 57.  -- Strong response to IV lasix x1, she has been responding well to just oral lasix. Net negative 4.1L. Weight down 7 lbs. Renal function remains stable. Currently on Lasix 40mg  po BID.  -- Due to new diagnosis of severe LV systolic dysfunction, LBBB, and intermittent though atypical chest pain she underwent LHC/RHC yesterday which revealed no CAD  -- Started on Toprol XL 12.5mg  daily. Consider adding very low dose ACE yesterday BP 107/65 to 95/54.  -lifevest rep aware of order  Chest pain  -- Negative enzymes, EKG shows LBBB of unclear duration.  -- s/p LHC 09/08/13 which revealed  1. No angiographic evidence of CAD  2. Normal filling pressures.  3. Non-ischemic cardiomyopathy   Hypothyroidism  -- TSH 4.830. Her TSH is elevated mildly, does not explain this degree of LV systolic dysfunction, she is on replacement.   CKD 2- now with  rising cr.  Will decrease lasix.  She has appt next week in HF clinic, they will increase meds.    plan for discharge.  LOS: 6 days   Time spent with pt. :15 minutes. [facility] R  Nurse Practitioner Certified Pager [phone] or after 5pm and on weekends call [phone] 09/11/2013, 8:01 AM    Patient seen and examined. Agree with assessment and plan. Fells well today. Received life-vest. Tolerated changing lisinopril to hs. BP 287 systolic today. Will decrease lasix to 40 mg daily. Titrate ace-i and BB as outpatient as BP allows. DC  today.   Troy Sine, MD, [facility] 09/11/2013 8:07 AM

## 2013-09-16 ENCOUNTER — Encounter (HOSPITAL_COMMUNITY): Payer: Self-pay

## 2013-09-16 NOTE — Progress Notes (Addendum)
Patient ID: [name], female   DOB: Jan 02, 1956, 58 y.o.   MRN: [id number] PCP: Dr. Woody Seller [facility]) Primary Cardiologist: Dr. Carlyle Dolly  HPI:  [name] is a a 58 yo female with a history of obesity, depression/anxiety, LBBB, hypothyroidism and chronic systolic HF.   Admitted for SOB 8/28-09/11/13 and found to have newly diagnosed systolic HF. Underwent R/LHC which showed normal filling pressures and nl coronaries. EF 15-20%  on ECHO (8/2015[facility] Follow up: Feeling better. This morning felt weak getting ready. Walking on the treadmill with no incline for 10 minutes daily with no SOB. Denies CP, PND, orthopnea or edema. Weight at home 177-178 lbs. Following a low salt diet and drinking less than 2L a day. Denies dizziness or palpitations. Wearing LifeVest with no alarms.   ROS: All systems negative except as listed in HPI, PMH and Problem List.  SH:  History   Social History  . Marital Status: Widowed    Spouse Name: N/A    Number of Children: N/A  . Years of Education: N/A   Occupational History  . Not on file.   Social History Main Topics  . Smoking status: Former Smoker    Types: Cigarettes  . Smokeless tobacco: Never Used     Comment: Quit around 58  . Alcohol Use: Yes     Comment: occasionally  . Drug Use: No  . Sexual Activity: Not on file   Other Topics Concern  . Not on file   Social History Narrative   Lives in [place] by herself and daughter lives next door. Retired [place]    FH: No family history on file.  Past Medical History  Diagnosis Date  . Asthma   . Bronchitis   . PNA (pneumonia)   . Thyroid disease   . Fibromyalgia   . Depression   . Anxiety   . Chronic systolic HF (heart failure)     a. NICM b. RHC (09/08/13): RA: 2, RV 26/0/2, PA: 25/9 (15), PCWP: 9, Fick CO/CI: 4.6 / 2.5, PA 68% c. ECHO (08/2013) EF 20-25%, grade II DD, RV sys fx mildly reduced, mod TR  . At risk for sudden cardiac death, life vest    . NICM (nonischemic  cardiomyopathy)     a. LHC (08/2013): no angiographic evidence of CAD  . Hypothyroidism     Current Outpatient Prescriptions  Medication Sig Dispense Refill  . acetaminophen (TYLENOL) 325 MG tablet Take 2 tablets (650 mg total) by mouth every 6 (six) hours as needed for mild pain or moderate pain.      Marland Kitchen aspirin EC 81 MG EC tablet Take 1 tablet (81 mg total) by mouth daily.      . cyclobenzaprine (FLEXERIL) 10 MG tablet Take 10 mg by mouth 3 (three) times daily as needed for muscle spasms.      . furosemide (LASIX) 40 MG tablet Take 40 mg by mouth daily.      Marland Kitchen HYDROcodone-acetaminophen (NORCO/VICODIN) 5-325 MG per tablet Take 1 tablet by mouth every 6 (six) hours as needed for moderate pain.      Marland Kitchen levothyroxine (SYNTHROID, LEVOTHROID) 150 MCG tablet Take 150 mcg by mouth daily before breakfast.      . lisinopril (PRINIVIL,ZESTRIL) 2.5 MG tablet Take 1 tablet (2.5 mg total) by mouth at bedtime.  30 tablet  6  . metoprolol succinate (TOPROL-XL) 25 MG 24 hr tablet Take 0.5 tablets (12.5 mg total) by mouth daily.  30 tablet  6  .  polyethylene glycol (MIRALAX / GLYCOLAX) packet Take 17 g by mouth daily as needed for moderate constipation.  14 each  0  . sertraline (ZOLOFT) 100 MG tablet Take 100 mg by mouth daily.       No current facility-administered medications for this encounter.    Filed Vitals:   09/17/13 0820  BP: 100/70  Pulse: 78  Weight: 178 lb 9.6 oz (81.012 kg)  SpO2: 96%    PHYSICAL EXAM:  General:  Well appearing. No resp difficulty, daughter present HEENT: normal Neck: supple. JVP flat. Carotids 2+ bilaterally; no bruits. No lymphadenopathy or thryomegaly appreciated. Cor: PMI normal. Regular rate & rhythm. No rubs, gallops or murmurs. Lungs: clear Abdomen: soft, nontender, nondistended. No hepatosplenomegaly. No bruits or masses. Good bowel sounds. Extremities: no cyanosis, clubbing, rash, edema Neuro: alert & orientedx3, cranial nerves grossly intact. Moves all 4  extremities w/o difficulty. Affect pleasant.   ASSESSMENT & PLAN:  1) Chronic systolic HF: NICM, EF 40-34%, grade II DD, RV sys fx mildly reduced, mod TR (08/2013) -Recently discharged from the [facility] for new onset HF. She was diuresed and started on BB and ACE-I. Discharge weight 179 lbs. - NYHA II symptoms and volume status stable. Will continue lasix 40 mg daily. - Will continue Toprol 12.5 mg daily and lisinopril 2.5 mg at night. Check BMET today. - Will try to start Spironolactone 12.5 mg daily. Check BMET 7-10 days. - Check T4 today, TSH was elevated in the [facility]. - Will need repeat ECHO in 3 months in early December to reassess EF. If remains less than 35% will refer to EP for consideration of CRT-D, QRS 166 mscec. - Discussed the role of the HF transitions clinic. We discussed the need to take medications daily, weigh daily, follow low sodium diet and restrict fluids to less than 2L a day. Will eventually try to transition back to Dr. Harl Bowie. 2) LBBB - As above will repeat ECHO in 3 months and may need referral for CRT-D 3) Obesity - Encouraged to try and lose weight. Watch portion control. 4) Snores - Family reports snoring. Will need to consider sending for sleep study will refer at next appointment.    F/U 2 weeks. Will have research talk to her about [employer]. [name]

## 2013-09-17 ENCOUNTER — Encounter (HOSPITAL_COMMUNITY): Payer: Self-pay

## 2013-09-17 ENCOUNTER — Ambulatory Visit (HOSPITAL_COMMUNITY)
Admit: 2013-09-17 | Discharge: 2013-09-17 | Disposition: A | Discharge status: Home / Self Care | Payer: 59 | Source: Ambulatory Visit | Attending: Internal Medicine | Admitting: Internal Medicine

## 2013-09-17 VITALS — BP 100/70 | HR 78 | Wt 178.6 lb

## 2013-09-17 DIAGNOSIS — I5020 Unspecified systolic (congestive) heart failure: Secondary | ICD-10-CM | POA: Diagnosis not present

## 2013-09-17 DIAGNOSIS — R0609 Other forms of dyspnea: Secondary | ICD-10-CM | POA: Diagnosis not present

## 2013-09-17 DIAGNOSIS — Z79899 Other long term (current) drug therapy: Secondary | ICD-10-CM | POA: Diagnosis not present

## 2013-09-17 DIAGNOSIS — I428 Other cardiomyopathies: Secondary | ICD-10-CM | POA: Diagnosis not present

## 2013-09-17 DIAGNOSIS — Z789 Other specified health status: Secondary | ICD-10-CM

## 2013-09-17 DIAGNOSIS — Z87891 Personal history of nicotine dependence: Secondary | ICD-10-CM | POA: Diagnosis not present

## 2013-09-17 DIAGNOSIS — Z7982 Long term (current) use of aspirin: Secondary | ICD-10-CM | POA: Diagnosis not present

## 2013-09-17 DIAGNOSIS — E669 Obesity, unspecified: Secondary | ICD-10-CM | POA: Insufficient documentation

## 2013-09-17 DIAGNOSIS — E038 Other specified hypothyroidism: Secondary | ICD-10-CM | POA: Diagnosis not present

## 2013-09-17 DIAGNOSIS — I5022 Chronic systolic (congestive) heart failure: Secondary | ICD-10-CM

## 2013-09-17 DIAGNOSIS — I447 Left bundle-branch block, unspecified: Secondary | ICD-10-CM | POA: Diagnosis not present

## 2013-09-17 DIAGNOSIS — Z9189 Other specified personal risk factors, not elsewhere classified: Secondary | ICD-10-CM

## 2013-09-17 DIAGNOSIS — R0989 Other specified symptoms and signs involving the circulatory and respiratory systems: Secondary | ICD-10-CM | POA: Insufficient documentation

## 2013-09-17 HISTORY — DX: Left bundle-branch block, unspecified: I44.7

## 2013-09-17 HISTORY — DX: Chronic systolic (congestive) heart failure: I50.22

## 2013-09-17 LAB — BASIC METABOLIC PANEL
Anion gap: 12 (ref 5–15)
BUN: 29 mg/dL — ABNORMAL HIGH (ref 6–23)
CHLORIDE: 100 meq/L (ref 96–112)
CO2: 28 mEq/L (ref 19–32)
Calcium: 10.7 mg/dL — ABNORMAL HIGH (ref 8.4–10.5)
Creatinine, Ser: 0.9 mg/dL (ref 0.50–1.10)
GFR calc Af Amer: 81 mL/min — ABNORMAL LOW (ref >90)
GFR calc non Af Amer: 70 mL/min — ABNORMAL LOW (ref >90)
Glucose, Bld: 74 mg/dL (ref 70–99)
POTASSIUM: 4.3 meq/L (ref 3.7–5.3)
Sodium: 140 mEq/L (ref 137–147)

## 2013-09-17 LAB — T4, FREE: Free T4: 1.54 ng/dL (ref 0.80–1.80)

## 2013-09-17 MED ORDER — SPIRONOLACTONE 25 MG PO TABS: 12.5000 mg | ORAL_TABLET | Freq: Every day | ORAL | Status: DC

## 2013-09-17 NOTE — Addendum Note (Signed)
Encounter addended by: Rande Brunt, NP on: 09/17/2013 12:04 PM<BR>     Documentation filed: Notes Section

## 2013-09-17 NOTE — Addendum Note (Signed)
Encounter addended by: Micki Riley, RN on: 09/17/2013  9:15 AM<BR>     Documentation filed: Orders

## 2013-09-17 NOTE — Patient Instructions (Signed)
Doing great.  Will start Spironolactone 12.5 mg (1/2 tablet) daily.  Will check labs today and will call you if any changes need to be made.  Follow up in 2 weeks.  Do the following things EVERYDAY: 1) Weigh yourself in the morning before breakfast. Write it down and keep it in a log. 2) Take your medicines as prescribed 3) Eat low salt foods-Limit salt (sodium) to 2000 mg per day.  4) Stay as active as you can everyday 5) Limit all fluids for the day to less than 2 liters 6)

## 2013-09-19 ENCOUNTER — Telehealth: Payer: Self-pay | Admitting: Cardiology

## 2013-09-19 NOTE — Telephone Encounter (Signed)
Pt calling in due to dizziness with the addition of spironolactone .  She had no way to take her BP.  I told her to hold the medication -spironolactone and I would have Deatra Canter call her on Monday.

## 2013-09-22 NOTE — Addendum Note (Signed)
Addended by: Scarlette Calico on: 09/22/2013 04:05 PM   Modules accepted: Orders, Medications

## 2013-09-22 NOTE — Telephone Encounter (Signed)
Spoke w/pt she states that since she has been off the [place] the dizziness has resolved, per Junie Bame, NP have pt to remain off medication for now, pt aware and agreeable

## 2013-09-25 ENCOUNTER — Encounter: Payer: Self-pay | Admitting: Cardiology

## 2013-09-25 ENCOUNTER — Encounter: Payer: 59 | Admitting: Cardiology

## 2013-09-25 ENCOUNTER — Ambulatory Visit (INDEPENDENT_AMBULATORY_CARE_PROVIDER_SITE_OTHER): Payer: 59 | Admitting: Cardiology

## 2013-09-25 VITALS — BP 96/63 | HR 77 | Ht 63.0 in | Wt 181.0 lb

## 2013-09-25 DIAGNOSIS — R404 Transient alteration of awareness: Secondary | ICD-10-CM

## 2013-09-25 DIAGNOSIS — R400 Somnolence: Secondary | ICD-10-CM

## 2013-09-25 DIAGNOSIS — I5022 Chronic systolic (congestive) heart failure: Secondary | ICD-10-CM

## 2013-09-25 NOTE — Progress Notes (Signed)
Clinical Summary Ms. Delgrande is a 58 y.o.female seen today for follow up of the following medical problems.   1. Chronic diastolic HF/NICM - new diagnosis during recent admit 08/2013, LVEF 15-20%, grade II diastolic dysfunction, moderate TR with PASP 57 - TSH 4.83 - cath 09/08/13 patent coronaries with normal filling pressures - initiated on medical therapy, initially limited due to low blood pressures.  - discharged with lifevest - diuresed 3.9 liters, discharge weight 179 lbs. Discharged on lasix 40mg  daily - was started on spironolactone, had severe dizziness and discontinued.   - notes 4 lbs weight gain, feels some abdominal swelling. Baseline weight around 177-178, Limiting sodium intake. Avoiding NSAIDs.  2. Chronic LBBB - negative cath.   3. OSA screen + snoring, no witnessed apneic episode. + day time fatigue  Past Medical History  Diagnosis Date  . Asthma   . Bronchitis   . PNA (pneumonia)   . Hypothyroidism   . Fibromyalgia   . Depression   . Anxiety   . Chronic systolic HF (heart failure)     a. NICM b. RHC (09/08/13): RA: 2, RV 26/0/2, PA: 25/9 (15), PCWP: 9, Fick CO/CI: 4.6 / 2.5, PA 68% c. ECHO (08/2013) EF 20-25%, grade II DD, RV sys fx mildly reduced, mod TR  . At risk for sudden cardiac death, life vest    . NICM (nonischemic cardiomyopathy)     a. LHC (08/2013): no angiographic evidence of CAD  . Left bundle Jaymen Fetch block      Allergies  Allergen Reactions  . Percocet [Oxycodone-Acetaminophen] Itching     Current Outpatient Prescriptions  Medication Sig Dispense Refill  . acetaminophen (TYLENOL) 325 MG tablet Take 2 tablets (650 mg total) by mouth every 6 (six) hours as needed for mild pain or moderate pain.      Marland Kitchen aspirin EC 81 MG EC tablet Take 1 tablet (81 mg total) by mouth daily.      . cyclobenzaprine (FLEXERIL) 10 MG tablet Take 10 mg by mouth 3 (three) times daily as needed for muscle spasms.      . furosemide (LASIX) 40 MG tablet Take 40 mg  by mouth daily.      Marland Kitchen HYDROcodone-acetaminophen (NORCO/VICODIN) 5-325 MG per tablet Take 1 tablet by mouth every 6 (six) hours as needed for moderate pain.      Marland Kitchen levothyroxine (SYNTHROID, LEVOTHROID) 150 MCG tablet Take 150 mcg by mouth daily before breakfast.      . lisinopril (PRINIVIL,ZESTRIL) 2.5 MG tablet Take 1 tablet (2.5 mg total) by mouth at bedtime.  30 tablet  6  . metoprolol succinate (TOPROL-XL) 25 MG 24 hr tablet Take 0.5 tablets (12.5 mg total) by mouth daily.  30 tablet  6  . polyethylene glycol (MIRALAX / GLYCOLAX) packet Take 17 g by mouth daily as needed for moderate constipation.  14 each  0  . sertraline (ZOLOFT) 100 MG tablet Take 100 mg by mouth daily.       No current facility-administered medications for this visit.     Past Surgical History  Procedure Laterality Date  . Abdominal hysterectomy    . Shoulder open rotator cuff repair Left   . Laparoscopic gastric banding       Allergies  Allergen Reactions  . Percocet [Oxycodone-Acetaminophen] Itching      Family History  Problem Relation Age of Onset  . Hypertension Mother     deceased: adenocarcinoma, HLD  . Cancer Father     deceased:  basal cell carcinoma     Social History Ms. Holsworth reports that she has quit smoking. Her smoking use included Cigarettes. She smoked 0.00 packs per day. She has never used smokeless tobacco. Ms. Weatherholtz reports that she drinks alcohol.   Review of Systems CONSTITUTIONAL: No weight loss, fever, chills, weakness or fatigue.  HEENT: Eyes: No visual loss, blurred vision, double vision or yellow sclerae.No hearing loss, sneezing, congestion, runny nose or sore throat.  SKIN: No rash or itching.  CARDIOVASCULAR: per HPI RESPIRATORY: No shortness of breath, cough or sputum.  GASTROINTESTINAL: No anorexia, nausea, vomiting or diarrhea. No abdominal pain or blood.  GENITOURINARY: No burning on urination, no polyuria NEUROLOGICAL: No headache, dizziness, syncope,  paralysis, ataxia, numbness or tingling in the extremities. No change in bowel or bladder control.  MUSCULOSKELETAL: No muscle, back pain, joint pain or stiffness.  LYMPHATICS: No enlarged nodes. No history of splenectomy.  PSYCHIATRIC: No history of depression or anxiety.  ENDOCRINOLOGIC: No reports of sweating, cold or heat intolerance. No polyuria or polydipsia.  Marland Kitchen   Physical Examination p 77 bp 96/63 Wt 181 lbs BMI 32 Gen: resting comfortably, no acute distress HEENT: no scleral icterus, pupils equal round and reactive, no palptable cervical adenopathy,  CV: RRR, no m/r/g, no JVD, no carotid bruits Resp: Clear to auscultation bilaterally GI: abdomen is soft, non-tender, non-distended, normal bowel sounds, no hepatosplenomegaly MSK: extremities are warm, no edema.  Skin: warm, no rash Neuro:  no focal deficits Psych: appropriate affect   Diagnostic Studies 09/08/13 Cath Hemodynamic Findings:  Ao: 94/66  LV: 93/4/8  RA: 2  RV: 26/0/2  PA: 25/9 (mean 15)  PCWP: 9  Fick Cardiac Output: 4.6 L/min  Fick Cardiac Index: 2.5 L/min/m2  Central Aortic Saturation: 96%  Pulmonary Artery Saturation: 68%  Angiographic Findings:  Left main: No obstructive disease.  Left Anterior Descending Artery: Large caliber vessel that courses to the apex. There are two large caliber diagonal branches. No obstructive disease.  Circumflex Artery: Large caliber vessel with a small caliber first obtuse marginal Anwita Mencer and a large caliber bifurcating second obtuse marginal Willye Javier. No obstructive disease.  Right Coronary Artery: Large dominant vessel with no obstructive disease.  Left Ventricular Angiogram: Deferred.  Impression:  1. No angiographic evidence of CAD  2. Normal filling pressures.  3. Non-ischemic cardiomyopathy  Recommendations: Continue medical management of her non-ischemic cardiomyopathy. Continue beta blocker. Would add Ace-inh as BP tolerates.  Complications: None; patient tolerated  the procedure well.  08/2013 Echo Study Conclusions  - Left ventricle: There is diffuse hypokinesis with akinesis of the entire anterior, basal and mid anterolateral walls and paradoxical septal motion. The cavity size was severely dilated. Systolic function was severely reduced. The estimated ejection fraction was in the range of 15-20%. Features are consistent with a pseudonormal left ventricular filling pattern, with concomitant abnormal relaxation and increased filling pressure (grade 2 diastolic dysfunction). Doppler parameters are consistent with elevated ventricular end-diastolic filling pressure. - Aortic valve: Trileaflet; normal thickness leaflets. There was no regurgitation. - Aortic root: The aortic root was normal in size. - Left atrium: The atrium was mildly dilated. - Right ventricle: Systolic function was mildly reduced. - Right atrium: The atrium was normal in size. - Tricuspid valve: There was moderate regurgitation. - Pulmonary arteries: Systolic pressure was moderately to severely increased. PA peak pressure: 57 mm Hg (S).  Impressions:  - Severe dilatation of the left ventricle with severely decreased LVEF 15-20%. Elevated filling pressures. Regional wall motion abnormalities consistent  with LBBB and suggestive of infarct in LAD territory. Moderate mitral and tricuspid regurgitation. Mild right ventricular systolic impairement and moderate to severe pulmonary hypertension.     Assessment and Plan  1. Chronic systolic HF - new diagnosis during admission 08/2013, LVEF 15-20%, NYHA III, NICM. She does not have ICD, has life vest while undergoing titration of medications. - recently started on aldactone but could not tolerate due to dizziness - continue current meds, titration limited due to low blood pressures. Likely next visit try changing to Toprol XL to bid with night dose before bed - 4 lbs weight gain over the last few days, some increased abomdinal  distension. She will take lasix 40mg  in AM and 20mg  at night over the weekend, to call us back if not improved  2. OSA screen - several symptoms of possible OSA, refer to sleep medicine  3. Chronic LBBB - cath without CAD - if needs ICD, consider CRT   F/u 1 month  Arnoldo Lenis, M.D.

## 2013-09-25 NOTE — Patient Instructions (Signed)
Continue all current medications. Referral for sleep study to Dr. Redmond Pulling Follow up in  1 month

## 2013-09-30 NOTE — Progress Notes (Signed)
Patient ID: [name], female   DOB: 08/04/1955, 58 y.o.   MRN: [id number]  PCP: [name]  HPI:  [name] is a a 58 yo female with a history of obesity, depression/anxiety, LBBB, hypothyroidism and chronic systolic HF.   Admitted for SOB 8/28-09/11/13 and found to have newly diagnosed systolic HF. Underwent R/LHC which showed normal filling pressures and nl coronaries. EF 15-20%  on ECHO (08/2013)   Follow up for Heart Failure: Last visit tried to start spironolactone 12.5 mg daily, however had dizziness and it was stopped. Saw [name] and weight was trending up so he increased lasix to 60 mg daily.  Reports she gets fatigued a couple of times a day. Denies SOB, orthopnea, PND or CP. Having a lot of back pain. Reports she has had some DOE with going up inclines. Walking on treadmill for 20 minutes 2x a day. Weight at home 180 lbs. Following a low salt diet and drinking less than 2La day. Wearing LifeVest and a few alarms over the weekend but no shocks.  ROS: All systems negative except as listed in HPI, PMH and Problem List.  SH:  History   Social History  . Marital Status: Widowed    Spouse Name: N/A    Number of Children: N/A  . Years of Education: N/A   Occupational History  . Not on file.   Social History Main Topics  . Smoking status: Former Smoker -- 1.00 packs/day for 16 years    Types: Cigarettes    Start date: 04/22/1972    Quit date: 09/25/1988  . Smokeless tobacco: Never Used     Comment: Quit around 49  . Alcohol Use: Yes     Comment: occasionally  . Drug Use: No  . Sexual Activity: Not on file   Other Topics Concern  . Not on file   Social History Narrative   Lives in [place] by herself and daughter lives next door. Retired [place]    FH:  Family History  Problem Relation Age of Onset  . Hypertension Mother     deceased: adenocarcinoma, HLD  . Cancer Father     deceased: basal cell carcinoma    Past  Medical History  Diagnosis Date  . Asthma   . Bronchitis   . PNA (pneumonia)   . Hypothyroidism   . Fibromyalgia   . Depression   . Anxiety   . Chronic systolic HF (heart failure)     a. NICM b. RHC (09/08/13): RA: 2, RV 26/0/2, PA: 25/9 (15), PCWP: 9, Fick CO/CI: 4.6 / 2.5, PA 68% c. ECHO (08/2013) EF 20-25%, grade II DD, RV sys fx mildly reduced, mod TR  . At risk for sudden cardiac death, life vest    . NICM (nonischemic cardiomyopathy)     a. LHC (08/2013): no angiographic evidence of CAD  . Left bundle branch block     Current Outpatient Prescriptions  Medication Sig Dispense Refill  . acetaminophen (TYLENOL) 325 MG tablet Take 2 tablets (650 mg total) by mouth every 6 (six) hours as needed for mild pain or moderate pain.      Marland Kitchen aspirin EC 81 MG EC tablet Take 1 tablet (81 mg total) by mouth daily.      . cyclobenzaprine (FLEXERIL) 10 MG tablet Take 10 mg by mouth 2 (two) times daily as needed for muscle spasms.       . diazepam (VALIUM) 5 MG tablet Take 2.5-5 mg  by mouth at bedtime as needed for anxiety.      . furosemide (LASIX) 40 MG tablet Take 40 mg by mouth daily.      Marland Kitchen HYDROcodone-acetaminophen (NORCO/VICODIN) 5-325 MG per tablet Take 1 tablet by mouth 2 (two) times daily as needed for moderate pain.       Marland Kitchen levothyroxine (SYNTHROID, LEVOTHROID) 150 MCG tablet Take 150 mcg by mouth daily before breakfast.      . lisinopril (PRINIVIL,ZESTRIL) 2.5 MG tablet Take 1 tablet (2.5 mg total) by mouth at bedtime.  30 tablet  6  . metoprolol succinate (TOPROL-XL) 25 MG 24 hr tablet Take 0.5 tablets (12.5 mg total) by mouth daily.  30 tablet  6  . polyethylene glycol (MIRALAX / GLYCOLAX) packet Take 17 g by mouth daily as needed for moderate constipation.  14 each  0  . sertraline (ZOLOFT) 100 MG tablet Take 100 mg by mouth daily.       No current facility-administered medications for this encounter.    Filed Vitals:   10/01/13 0952  BP: 98/58  Pulse: 73  Resp: 18  Weight: 182  lb 6 oz (82.725 kg)  SpO2: 99%    PHYSICAL EXAM: General:  Well appearing. No resp difficulty, daughter present HEENT: normal Neck: supple. JVP flat. Carotids 2+ bilaterally; no bruits. No lymphadenopathy or thryomegaly appreciated. Cor: PMI normal. Regular rate & rhythm. No rubs, gallops or murmurs. Lungs: clear Abdomen: soft, nontender, nondistended. No hepatosplenomegaly. No bruits or masses. Good bowel sounds. Extremities: no cyanosis, clubbing, rash, edema Neuro: alert & orientedx3, cranial nerves grossly intact. Moves all 4 extremities w/o difficulty. Affect pleasant.   ASSESSMENT & PLAN:  1) Chronic systolic HF: NICM, EF 79-02%, grade II DD, RV sys fx mildly reduced, mod TR (08/2013) -Recently discharged from the hospital for new onset HF. She was diuresed and started on BB and ACE-I. Discharge weight 179 lbs. - NYHA II symptoms and volume status stable. Will continue lasix 60 mg daily. - Continue Toprol 12.5 mg daily and lisinopril 2.5 mg daily. Will not titrate with soft BP. - Did not tolerate low dose spiro with hypotension will try to add back later on. - Check UPEP/SPEP and iron studies.  - Will need repeat ECHO in 3 months in early December to reassess EF. If remains less than 35% will refer to EP for consideration of CRT-D, QRS 166 mscec. Continue LifeVest.  - Discussed the role of the HF transitions clinic. We discussed the need to take medications daily, weigh daily, follow low sodium diet and restrict fluids to less than 2L a day. Will eventually try to transition back to [name]. 2) LBBB - As above will repeat ECHO in 3 months and may need referral for CRT-D 3) Obesity - Encouraged to try and lose weight and watch portion control. 4) Snores - Family reports snoring. [name] referred for sleep study.   F/U 1 month [name]

## 2013-10-01 ENCOUNTER — Ambulatory Visit (HOSPITAL_COMMUNITY)
Admission: RE | Admit: 2013-10-01 | Discharge: 2013-10-01 | Disposition: A | Discharge status: Home / Self Care | Payer: 59 | Source: Ambulatory Visit | Attending: Internal Medicine | Admitting: Internal Medicine

## 2013-10-01 ENCOUNTER — Other Ambulatory Visit (HOSPITAL_COMMUNITY): Payer: Self-pay

## 2013-10-01 VITALS — BP 98/58 | HR 73 | Resp 18 | Wt 182.4 lb

## 2013-10-01 DIAGNOSIS — E039 Hypothyroidism, unspecified: Secondary | ICD-10-CM | POA: Diagnosis not present

## 2013-10-01 DIAGNOSIS — Z79899 Other long term (current) drug therapy: Secondary | ICD-10-CM | POA: Insufficient documentation

## 2013-10-01 DIAGNOSIS — E669 Obesity, unspecified: Secondary | ICD-10-CM | POA: Insufficient documentation

## 2013-10-01 DIAGNOSIS — I428 Other cardiomyopathies: Secondary | ICD-10-CM

## 2013-10-01 DIAGNOSIS — R0989 Other specified symptoms and signs involving the circulatory and respiratory systems: Secondary | ICD-10-CM | POA: Insufficient documentation

## 2013-10-01 DIAGNOSIS — I447 Left bundle-branch block, unspecified: Secondary | ICD-10-CM

## 2013-10-01 DIAGNOSIS — Z7982 Long term (current) use of aspirin: Secondary | ICD-10-CM | POA: Diagnosis not present

## 2013-10-01 DIAGNOSIS — F411 Generalized anxiety disorder: Secondary | ICD-10-CM | POA: Diagnosis not present

## 2013-10-01 DIAGNOSIS — N182 Chronic kidney disease, stage 2 (mild): Secondary | ICD-10-CM

## 2013-10-01 DIAGNOSIS — R0609 Other forms of dyspnea: Secondary | ICD-10-CM | POA: Insufficient documentation

## 2013-10-01 DIAGNOSIS — I5022 Chronic systolic (congestive) heart failure: Secondary | ICD-10-CM

## 2013-10-01 LAB — BASIC METABOLIC PANEL
Anion gap: 10 (ref 5–15)
BUN: 26 mg/dL — ABNORMAL HIGH (ref 6–23)
CO2: 30 meq/L (ref 19–32)
Calcium: 10 mg/dL (ref 8.4–10.5)
Chloride: 103 mEq/L (ref 96–112)
Creatinine, Ser: 0.96 mg/dL (ref 0.50–1.10)
GFR calc Af Amer: 75 mL/min — ABNORMAL LOW (ref >90)
GFR, EST NON AFRICAN AMERICAN: 65 mL/min — AB (ref >90)
GLUCOSE: 104 mg/dL — AB (ref 70–99)
POTASSIUM: 3.7 meq/L (ref 3.7–5.3)
SODIUM: 143 meq/L (ref 137–147)

## 2013-10-01 LAB — IRON AND TIBC
IRON: 61 ug/dL (ref 42–135)
Saturation Ratios: 19 % — ABNORMAL LOW (ref 20–55)
TIBC: 322 ug/dL (ref 250–470)
UIBC: 261 ug/dL (ref 125–400)

## 2013-10-01 LAB — PRO B NATRIURETIC PEPTIDE: Pro B Natriuretic peptide (BNP): 2096 pg/mL — ABNORMAL HIGH (ref 0–125)

## 2013-10-01 MED ORDER — FUROSEMIDE 40 MG PO TABS: 60.0000 mg | ORAL_TABLET | Freq: Every day | ORAL | Status: DC

## 2013-10-01 MED ORDER — FUROSEMIDE 40 MG PO TABS: 40.0000 mg | ORAL_TABLET | Freq: Two times a day (BID) | ORAL | Status: DC

## 2013-10-01 MED ORDER — POLYETHYLENE GLYCOL 3350 17 GM/SCOOP PO POWD: 1.0000 | ORAL | Status: DC | PRN

## 2013-10-01 NOTE — Patient Instructions (Addendum)
Doing great.  Continue taking your medications as prescribed.  Call any issues.  Follow up in 1 month.  Do the following things EVERYDAY: 1) Weigh yourself in the morning before breakfast. Write it down and keep it in a log. 2) Take your medicines as prescribed 3) Eat low salt foods-Limit salt (sodium) to 2000 mg per day.  4) Stay as active as you can everyday 5) Limit all fluids for the day to less than 2 liters 6)

## 2013-10-03 LAB — PROTEIN ELECTROPHORESIS, SERUM
ALPHA-1-GLOBULIN: 4.8 % (ref 2.9–4.9)
Albumin ELP: 59.1 % (ref 55.8–66.1)
Alpha-2-Globulin: 11.4 % (ref 7.1–11.8)
Beta 2: 5.3 % (ref 3.2–6.5)
Beta Globulin: 5.7 % (ref 4.7–7.2)
Gamma Globulin: 13.7 % (ref 11.1–18.8)
M-SPIKE, %: NOT DETECTED g/dL
TOTAL PROTEIN ELP: 6.7 g/dL (ref 6.0–8.3)

## 2013-10-03 LAB — UIFE/LIGHT CHAINS/TP QN, 24-HR UR
ALBUMIN, U: DETECTED
ALPHA 2 UR: NOT DETECTED
Alpha 1, Urine: NOT DETECTED
BETA UR: NOT DETECTED
Gamma Globulin, Urine: NOT DETECTED
Total Protein, Urine: <4 mg/dL — ABNORMAL LOW (ref 5–24)

## 2013-10-06 ENCOUNTER — Telehealth: Payer: Self-pay | Admitting: Cardiovascular Disease

## 2013-10-06 NOTE — Telephone Encounter (Signed)
Patient called inquiring about her referral to have sleep study done.

## 2013-10-07 NOTE — Telephone Encounter (Signed)
Mrs. Hegstrom called office stating that this is the correct telephone #. I gave her updated Appointment with Dr. Lucila Maine.

## 2013-10-07 NOTE — Telephone Encounter (Signed)
Tried calling patient's home # as listed.  This number is incorrect. I called her brother Clair Gulling asking him to contact [place] so that I may Update her appointment with Dr. Redmond Pulling.

## 2013-10-22 ENCOUNTER — Telehealth (HOSPITAL_COMMUNITY): Payer: Self-pay | Admitting: *Deleted

## 2013-10-22 ENCOUNTER — Encounter (HOSPITAL_COMMUNITY): Payer: Self-pay | Admitting: Emergency Medicine

## 2013-10-22 ENCOUNTER — Emergency Department (HOSPITAL_COMMUNITY): Payer: 59

## 2013-10-22 ENCOUNTER — Emergency Department (HOSPITAL_COMMUNITY)
Admission: EM | Admit: 2013-10-22 | Discharge: 2013-10-23 | Disposition: A | Discharge status: Home / Self Care | Payer: 59 | Attending: Emergency Medicine | Admitting: Emergency Medicine

## 2013-10-22 DIAGNOSIS — Z87891 Personal history of nicotine dependence: Secondary | ICD-10-CM | POA: Insufficient documentation

## 2013-10-22 DIAGNOSIS — R55 Syncope and collapse: Secondary | ICD-10-CM | POA: Insufficient documentation

## 2013-10-22 DIAGNOSIS — I5022 Chronic systolic (congestive) heart failure: Secondary | ICD-10-CM | POA: Insufficient documentation

## 2013-10-22 DIAGNOSIS — Z7982 Long term (current) use of aspirin: Secondary | ICD-10-CM | POA: Diagnosis not present

## 2013-10-22 DIAGNOSIS — Z8701 Personal history of pneumonia (recurrent): Secondary | ICD-10-CM | POA: Diagnosis not present

## 2013-10-22 DIAGNOSIS — Z79899 Other long term (current) drug therapy: Secondary | ICD-10-CM | POA: Diagnosis not present

## 2013-10-22 DIAGNOSIS — F419 Anxiety disorder, unspecified: Secondary | ICD-10-CM | POA: Diagnosis not present

## 2013-10-22 DIAGNOSIS — J45901 Unspecified asthma with (acute) exacerbation: Secondary | ICD-10-CM | POA: Diagnosis not present

## 2013-10-22 DIAGNOSIS — E039 Hypothyroidism, unspecified: Secondary | ICD-10-CM | POA: Insufficient documentation

## 2013-10-22 DIAGNOSIS — R079 Chest pain, unspecified: Secondary | ICD-10-CM | POA: Insufficient documentation

## 2013-10-22 LAB — BASIC METABOLIC PANEL
ANION GAP: 12 (ref 5–15)
BUN: 23 mg/dL (ref 6–23)
CO2: 29 mEq/L (ref 19–32)
Calcium: 10.4 mg/dL (ref 8.4–10.5)
Chloride: 99 mEq/L (ref 96–112)
Creatinine, Ser: 0.85 mg/dL (ref 0.50–1.10)
GFR, EST AFRICAN AMERICAN: 87 mL/min — AB (ref >90)
GFR, EST NON AFRICAN AMERICAN: 75 mL/min — AB (ref >90)
GLUCOSE: 92 mg/dL (ref 70–99)
Potassium: 4.1 mEq/L (ref 3.7–5.3)
SODIUM: 140 meq/L (ref 137–147)

## 2013-10-22 LAB — CBC
HCT: 41.8 % (ref 36.0–46.0)
HEMOGLOBIN: 13.8 g/dL (ref 12.0–15.0)
MCH: 29.6 pg (ref 26.0–34.0)
MCHC: 33 g/dL (ref 30.0–36.0)
MCV: 89.7 fL (ref 78.0–100.0)
PLATELETS: 199 10*3/uL (ref 150–400)
RBC: 4.66 MIL/uL (ref 3.87–5.11)
RDW: 13 % (ref 11.5–15.5)
WBC: 7 10*3/uL (ref 4.0–10.5)

## 2013-10-22 LAB — I-STAT TROPONIN, ED: TROPONIN I, POC: 0.02 ng/mL (ref 0.00–0.08)

## 2013-10-22 LAB — PRO B NATRIURETIC PEPTIDE: PRO B NATRI PEPTIDE: 2284 pg/mL — AB (ref 0–125)

## 2013-10-22 MED ORDER — GI COCKTAIL ~~LOC~~
30.0000 mL | Freq: Once | ORAL | Status: AC
  Administered 2013-10-22: 30 mL via ORAL
  Filled 2013-10-22: qty 30

## 2013-10-22 NOTE — Telephone Encounter (Signed)
Pt called c/o chest heaviness and discomfort with increased SOB, she states this all started about an hour ago while she was walking in [place] she states it has not gotten any better, she is at the urgent care center waiting to be seen but they advised her to call us, she states wt has been stable for past week or so and she has been taking all medications, she states she walked in the yard earlier today with no complications, advised since she was at urgent care they should go ahead and see her if something urgent they can send her to ER, if they don't find anything and pt not feeling better she will call us back in the AM

## 2013-10-22 NOTE — ED Notes (Signed)
Per pt sts chest pain that started 35 min ago. Pt wears external pacemaker. sts also some SOB.

## 2013-10-22 NOTE — Discharge Instructions (Signed)

## 2013-10-22 NOTE — Consult Note (Signed)
CARDIOLOGY CONSULT NOTE  Patient ID: [name], MRN: [id number], DOB/AGE: 09-06-1955 58 y.o. Admit date: 10/22/2013 Date of Consult: 10/22/2013  Primary Physician: Glenda Chroman., MD Primary Cardiologist: [facility]  Chief Complaint: chest pain Reason for Consultation: disposition  HPI: 58 y.o. female w/ PMHx significant for nonischemic CMP with reduced EF, LBBB, lifevest pending repeat EF evaluation who presented [facility]/22/2013 with complaints of chest pain and shortness of breath and lightheadedness. This occurred while taking a walk at home. Improved with resting. Not pleuritic. No weight gain (at 177-178 # baseline), no orthopnea, no LE edema, no bloating. No palpitions, no diaphoresis. Her device has not fired, expelled gel or beeped. Felt pulse and states it was fast but not extremely fast.  She feels almost back to baseline currently. Mild SOB with ambulating to the bathroom. No lightheadedness.   Not able to palpate the pain. Has never had GERD before. No burning, no acid taste.  Chronic LBBB, no arrhythmias on telemetry.  BNP of 2000, similar to prior when euvolemic.   Past Medical History  Diagnosis Date  . Asthma   . Bronchitis   . PNA (pneumonia)   . Hypothyroidism   . Fibromyalgia   . Depression   . Anxiety   . Chronic systolic HF (heart failure)     a. NICM b. RHC (09/08/13): RA: 2, RV 26/0/2, PA: 25/9 (15), PCWP: 9, Fick CO/CI: 4.6 / 2.5, PA 68% c. ECHO (08/2013) EF 20-25%, grade II DD, RV sys fx mildly reduced, mod TR  . At risk for sudden cardiac death, life vest    . NICM (nonischemic cardiomyopathy)     a. LHC (08/2013): no angiographic evidence of CAD  . Left bundle branch block       Surgical History:  Past Surgical History  Procedure Laterality Date  . Abdominal hysterectomy    . Shoulder open rotator cuff repair Left   . Laparoscopic gastric banding       Home Meds: Prior to Admission medications   Medication Sig Start  Date End Date Taking? Authorizing Provider  acetaminophen (TYLENOL) 325 MG tablet Take 2 tablets (650 mg total) by mouth every 6 (six) hours as needed for mild pain or moderate pain. 09/10/13  Yes Isaiah Serge, NP  aspirin EC 81 MG EC tablet Take 1 tablet (81 mg total) by mouth daily. 09/10/13  Yes Isaiah Serge, NP  cyclobenzaprine (FLEXERIL) 10 MG tablet Take 10 mg by mouth 2 (two) times daily as needed for muscle spasms.    Yes Historical Provider, MD  diazepam (VALIUM) 5 MG tablet Take 2.5-5 mg by mouth at bedtime as needed for anxiety.   Yes Historical Provider, MD  diclofenac sodium (VOLTAREN) 1 % GEL Apply 1 application topically daily as needed (fibromyalgia).   Yes Historical Provider, MD  furosemide (LASIX) 40 MG tablet Take 60 mg by mouth daily.   Yes Historical Provider, MD  HYDROcodone-acetaminophen (NORCO/VICODIN) 5-325 MG per tablet Take 1 tablet by mouth 2 (two) times daily as needed for moderate pain.    Yes Historical Provider, MD  levothyroxine (SYNTHROID, LEVOTHROID) 150 MCG tablet Take 150 mcg by mouth daily before breakfast.   Yes Historical Provider, MD  lisinopril (PRINIVIL,ZESTRIL) 2.5 MG tablet Take 1 tablet (2.5 mg total) by mouth at bedtime. 09/11/13  Yes Isaiah Serge, NP  metoprolol succinate (TOPROL-XL) 25 MG 24 hr tablet Take 0.5 tablets (12.5 mg total) by mouth daily. 09/10/13  Yes Otilio Carpen  Dorene Ar, NP  polyethylene glycol (MIRALAX / GLYCOLAX) packet Take 17 g by mouth daily as needed for moderate constipation. 09/10/13  Yes Isaiah Serge, NP  sertraline (ZOLOFT) 100 MG tablet Take 100 mg by mouth daily.   Yes Historical Provider, MD    Inpatient Medications:    Allergies:  Allergies  Allergen Reactions  . Nucynta [Tapentadol] Itching  . Percocet [Oxycodone-Acetaminophen] Itching    History   Social History  . Marital Status: Widowed    Spouse Name: N/A    Number of Children: N/A  . Years of Education: N/A   Occupational History  . Not on file.   Social  History Main Topics  . Smoking status: Former Smoker -- 1.00 packs/day for 16 years    Types: Cigarettes    Start date: 04/22/1972    Quit date: 09/25/1988  . Smokeless tobacco: Never Used     Comment: Quit around 58  . Alcohol Use: Yes     Comment: occasionally  . Drug Use: No  . Sexual Activity: Not on file   Other Topics Concern  . Not on file   Social History Narrative   Lives in [place] by herself and daughter lives next door. Retired [place]     Family History  Problem Relation Age of Onset  . Hypertension Mother     deceased: adenocarcinoma, HLD  . Cancer Father     deceased: basal cell carcinoma     Review of Systems: General: negative for chills, fever, night sweats or weight changes.  Cardiovascular: see HPI Dermatological: negative for rash Respiratory: negative for cough or wheezing Urologic: negative for hematuria Abdominal: negative for nausea, vomiting, diarrhea, bright red blood per rectum, melena, or hematemesis Neurologic: negative for visual changes, syncope, All other systems reviewed and are otherwise negative except as noted above.  Labs: No results found for this basename: CKTOTAL, CKMB, TROPONINI,  in the last 72 hours Lab Results  Component Value Date   WBC 7.0 10/22/2013   HGB 13.8 10/22/2013   HCT 41.8 10/22/2013   MCV 89.7 10/22/2013   PLT 199 10/22/2013    Recent Labs Lab 10/22/13 1903  NA 140  K 4.1  CL 99  CO2 29  BUN 23  CREATININE 0.85  CALCIUM 10.4  GLUCOSE 92   No results found for this basename: CHOL, HDL, LDLCALC, TRIG   No results found for this basename: DDIMER    Radiology/Studies:  Dg Chest 2 View  10/22/2013   CLINICAL DATA:  Chest pain radiating to the left side which shortness of breath  EXAM: CHEST  2 VIEW  COMPARISON:  CT chest 09/05/2013  FINDINGS: The heart size and mediastinal contours are within normal limits. Both lungs are clear. The visualized skeletal structures are unremarkable.  IMPRESSION: No active  cardiopulmonary disease.   Electronically Signed   By: Kathreen Devoid   On: 10/22/2013 20:29    EKG: sinus, LBBB  Physical Exam: Blood pressure 100/61, pulse 76, temperature 98.2 F (36.8 C), temperature source Oral, resp. rate 21, height 5\' 3"  (1.6 m), weight 81.194 kg (179 lb), SpO2 99.00%. General: Well developed, well nourished, in no acute distress. Head: Normocephalic, atraumatic, sclera non-icteric, no xanthomas, nares are without discharge.  Neck: Supple. Negative for carotid bruits. JVD not elevated. Lungs: Clear bilaterally to auscultation without wheezes, rales, or rhonchi. Breathing is unlabored. Heart: RRR with S1 S2. No murmurs, rubs, or gallops appreciated. Unable to palpate and reproduce chest pain.  Abdomen: Soft, non-tender,  non-distended with normoactive bowel sounds. No hepatomegaly. No rebound/guarding. No obvious abdominal masses. Msk:  Strength and tone appear normal for age. Extremities: No clubbing or cyanosis. No edema.  Distal pedal pulses are 2+ and equal bilaterally. Neuro: Alert and oriented X 3. Moves all extremities spontaneously. Psych:  Responds to questions appropriately with a normal affect.   Assessment and Plan:  58 y.o. female w/ PMHx significant for nonischemic CMP with reduced EF, LBBB, lifevest pending repeat EF evaluation who presented to [facility] on 10/22/2013 with complaints of chest pain and shortness of breath and lightheadedness, now resolved while in the ER.  Encouragingly, no significantly deviations/abnormalities from baseline can be found. She does not appear to be fluid overloaded by exam, cxray, labs, or history. This is not CAD given her recent normal cath. Doubt arrhythmia given presence of symptoms but absence on telemetry. No device firing to suggest rapid VT.  Unfortunately though, can't definitively diagnose what caused the symptoms. However, she is hemodynamically stable and almost back to baseline and has ambulated without  difficulty. At this time, I do not think that she would benefit from hospitalization and she is agreeable for close monitoring at home (friends/family available to take care) and to return to the ER if symptoms return (syncope, presycnope, CHF symptoms, palpitations etc.)  Has close followup scheduled already in the next 10 days with cardiology.  Will arrange for CHF nursing to follow up phone call in the next day or so.  Signed, Elias Else, Ja Pistole C. MD 10/22/2013, 11:16 PM

## 2013-10-22 NOTE — ED Provider Notes (Signed)
CSN: [id number]     Arrival date & time 10/22/13  4097 History   First MD Initiated Contact with Patient 10/22/13 2001     Chief Complaint  Patient presents with  . Chest Pain     (Consider location/radiation/quality/duration/timing/severity/associated sxs/prior Treatment) Patient is a 58 y.o. female presenting with chest pain.  Chest Pain Pain location:  L chest Pain quality: pressure   Pain radiates to:  Epigastrium Pain radiates to the back: no   Pain severity:  Moderate Onset quality:  Sudden Duration:  30 minutes Timing:  Constant Progression:  Resolved Chronicity:  New Context: not at rest   Relieved by:  Rest Worsened by:  Exertion Ineffective treatments:  None tried Associated symptoms: near-syncope and shortness of breath   Associated symptoms: no abdominal pain, no cough, no diaphoresis, no fever, no headache, no nausea and not vomiting   Risk factors: hypertension   Risk factors: no coronary artery disease   Risk factors comment:  EF 15%   Past Medical History  Diagnosis Date  . Asthma   . Bronchitis   . PNA (pneumonia)   . Hypothyroidism   . Fibromyalgia   . Depression   . Anxiety   . Chronic systolic HF (heart failure)     a. NICM b. RHC (09/08/13): RA: 2, RV 26/0/2, PA: 25/9 (15), PCWP: 9, Fick CO/CI: 4.6 / 2.5, PA 68% c. ECHO (08/2013) EF 20-25%, grade II DD, RV sys fx mildly reduced, mod TR  . At risk for sudden cardiac death, life vest    . NICM (nonischemic cardiomyopathy)     a. LHC (08/2013): no angiographic evidence of CAD  . Left bundle branch block    Past Surgical History  Procedure Laterality Date  . Abdominal hysterectomy    . Shoulder open rotator cuff repair Left   . Laparoscopic gastric banding     Family History  Problem Relation Age of Onset  . Hypertension Mother     deceased: adenocarcinoma, HLD  . Cancer Father     deceased: basal cell carcinoma   History  Substance Use Topics  . Smoking status: Former Smoker -- 1.00  packs/day for 16 years    Types: Cigarettes    Start date: 04/22/1972    Quit date: 09/25/1988  . Smokeless tobacco: Never Used     Comment: Quit around 38  . Alcohol Use: Yes     Comment: occasionally   OB History   Grav Para Term Preterm Abortions TAB SAB Ect Mult Living                 Review of Systems  Constitutional: Negative for fever, chills and diaphoresis.  HENT: Negative for congestion and sore throat.   Eyes: Negative for visual disturbance.  Respiratory: Positive for shortness of breath. Negative for cough and wheezing.   Cardiovascular: Positive for chest pain and near-syncope. Negative for leg swelling.  Gastrointestinal: Negative for nausea, vomiting, abdominal pain, diarrhea and constipation.  Genitourinary: Negative for dysuria, difficulty urinating and vaginal pain.  Musculoskeletal: Negative for arthralgias and myalgias.  Skin: Negative for rash.  Neurological: Positive for light-headedness. Negative for syncope and headaches.  Psychiatric/Behavioral: Negative for behavioral problems.  All other systems reviewed and are negative.     Allergies  Nucynta and Percocet  Home Medications   Prior to Admission medications   Medication Sig Start Date End Date Taking? Authorizing Provider  acetaminophen (TYLENOL) 325 MG tablet Take 2 tablets (650 mg total) by mouth every  6 (six) hours as needed for mild pain or moderate pain. 09/10/13  Yes Isaiah Serge, NP  aspirin EC 81 MG EC tablet Take 1 tablet (81 mg total) by mouth daily. 09/10/13  Yes Isaiah Serge, NP  cyclobenzaprine (FLEXERIL) 10 MG tablet Take 10 mg by mouth 2 (two) times daily as needed for muscle spasms.    Yes Historical Provider, MD  diazepam (VALIUM) 5 MG tablet Take 2.5-5 mg by mouth at bedtime as needed for anxiety.   Yes Historical Provider, MD  diclofenac sodium (VOLTAREN) 1 % GEL Apply 1 application topically daily as needed (fibromyalgia).   Yes Historical Provider, MD  furosemide (LASIX) 40  MG tablet Take 60 mg by mouth daily.   Yes Historical Provider, MD  HYDROcodone-acetaminophen (NORCO/VICODIN) 5-325 MG per tablet Take 1 tablet by mouth 2 (two) times daily as needed for moderate pain.    Yes Historical Provider, MD  levothyroxine (SYNTHROID, LEVOTHROID) 150 MCG tablet Take 150 mcg by mouth daily before breakfast.   Yes Historical Provider, MD  lisinopril (PRINIVIL,ZESTRIL) 2.5 MG tablet Take 1 tablet (2.5 mg total) by mouth at bedtime. 09/11/13  Yes Isaiah Serge, NP  metoprolol succinate (TOPROL-XL) 25 MG 24 hr tablet Take 0.5 tablets (12.5 mg total) by mouth daily. 09/10/13  Yes Isaiah Serge, NP  polyethylene glycol (MIRALAX / GLYCOLAX) packet Take 17 g by mouth daily as needed for moderate constipation. 09/10/13  Yes Isaiah Serge, NP  sertraline (ZOLOFT) 100 MG tablet Take 100 mg by mouth daily.   Yes Historical Provider, MD   BP 121/57  Pulse 87  Temp(Src) 98.2 F (36.8 C) (Oral)  Resp 23  Ht 5\' 3"  (1.6 m)  Wt 179 lb (81.194 kg)  BMI 31.72 kg/m2  SpO2 99% Physical Exam  Constitutional: She is oriented to person, place, and time. She appears well-developed and well-nourished. No distress.  HENT:  Head: Normocephalic and atraumatic.  Eyes: EOM are normal.  Neck: Normal range of motion. No JVD present.  Cardiovascular: Normal rate, regular rhythm and normal heart sounds.   No murmur heard. Pulmonary/Chest: Effort normal and breath sounds normal. No respiratory distress. She has no wheezes.  Abdominal: Soft. There is no tenderness.  Musculoskeletal: She exhibits no edema.  Neurological: She is alert and oriented to person, place, and time.  Skin: She is not diaphoretic.  Psychiatric: She has a normal mood and affect. Her behavior is normal.    ED Course  Procedures (including critical care time) Labs Review Labs Reviewed  BASIC METABOLIC PANEL - Abnormal; Notable for the following:    GFR calc non Af Amer 75 (*)    GFR calc Af Amer 87 (*)    All other  components within normal limits  PRO B NATRIURETIC PEPTIDE - Abnormal; Notable for the following:    Pro B Natriuretic peptide (BNP) 2284.0 (*)    All other components within normal limits  CBC  I-STAT TROPOININ, ED    Imaging Review Dg Chest 2 View  10/22/2013   CLINICAL DATA:  Chest pain radiating to the left side which shortness of breath  EXAM: CHEST  2 VIEW  COMPARISON:  CT chest 09/05/2013  FINDINGS: The heart size and mediastinal contours are within normal limits. Both lungs are clear. The visualized skeletal structures are unremarkable.  IMPRESSION: No active cardiopulmonary disease.   Electronically Signed   By: Kathreen Devoid   On: 10/22/2013 20:29     EKG Interpretation  Date/Time:  Wednesday October 22 2013 19:00:06 EDT Ventricular Rate:  77 PR Interval:  138 QRS Duration: 168 QT Interval:  444 QTC Calculation: 502 R Axis:   52 Text Interpretation:  Normal sinus rhythm Left bundle branch block  Abnormal ECG No significant change since last tracing Confirmed by  Christy Gentles  MD, Elenore Rota ([id number]) on 10/22/2013 7:03:39 PM      MDM   Final diagnoses:  Chest pain, unspecified chest pain type  Near syncope    Patient is a 58 year old female with a history of systolic congestive heart failure (EF 15%), Currently wearing a life best that presents with chest pain, shortness of breath, near-syncope. Patient had a catheterization one month ago that showed patent coronary arteries. Patient states that she is very compliant with her diet and fluid intake has had no decrease in urination. Patient denies any increase in recent weight or swelling in her extremities. Patient says that she was walking around a shopping center when this event occurred and lasted for approximately 30 minutes and resolved with rest. Patient is followed by cardiology here, they were counseled to.  Patient's labs and imaging are unremarkable. Cardiology evaluated the patient and given no clear cause for the  patient's symptoms with clean coronaries this is very unlikely ACS. Cardiology feels that they have no additional therapies to offer the patient by admission to the decision was made jointly with the patient to be discharged home with very strict return precautions. The patient voiced understanding to this and was happy with this plan. Patient is to follow up with her cardiologist within 2 days.    Renne Musca, MD 10/23/13 0200

## 2013-10-22 NOTE — ED Provider Notes (Signed)
Patient seen/examined in the Emergency Department in conjunction with Resident Physician Provider Robina Ade Patient reports chest pain and near syncope Exam : awake/alert, no distress.  No added lung sounds.  No loud murmurs noted Plan: consult cardiology, likely admit    Sharyon Cable, MD 10/22/13 2204

## 2013-10-23 NOTE — ED Provider Notes (Signed)
I have personally seen and examined the patient.  I have discussed the plan of care with the resident.  I have reviewed the documentation on PMH/FH/Soc. History.  I have reviewed the documentation of the resident and agree.  After discussion with cardiology, joint decision for patient to be discharged home with cardiology followup   Sharyon Cable, MD 10/23/13 [phone]

## 2013-10-31 ENCOUNTER — Encounter (HOSPITAL_COMMUNITY): Payer: Self-pay

## 2013-10-31 ENCOUNTER — Ambulatory Visit (HOSPITAL_COMMUNITY)
Admission: RE | Admit: 2013-10-31 | Discharge: 2013-10-31 | Disposition: A | Discharge status: Home / Self Care | Payer: 59 | Source: Ambulatory Visit | Attending: Cardiology | Admitting: Cardiology

## 2013-10-31 VITALS — BP 86/64 | HR 68 | Resp 18 | Wt 176.5 lb

## 2013-10-31 DIAGNOSIS — Z79891 Long term (current) use of opiate analgesic: Secondary | ICD-10-CM | POA: Diagnosis not present

## 2013-10-31 DIAGNOSIS — I5022 Chronic systolic (congestive) heart failure: Secondary | ICD-10-CM | POA: Diagnosis not present

## 2013-10-31 DIAGNOSIS — Z87891 Personal history of nicotine dependence: Secondary | ICD-10-CM | POA: Insufficient documentation

## 2013-10-31 DIAGNOSIS — I447 Left bundle-branch block, unspecified: Secondary | ICD-10-CM | POA: Diagnosis not present

## 2013-10-31 DIAGNOSIS — I429 Cardiomyopathy, unspecified: Secondary | ICD-10-CM | POA: Diagnosis not present

## 2013-10-31 DIAGNOSIS — R0683 Snoring: Secondary | ICD-10-CM | POA: Diagnosis not present

## 2013-10-31 DIAGNOSIS — Z7982 Long term (current) use of aspirin: Secondary | ICD-10-CM | POA: Diagnosis not present

## 2013-10-31 DIAGNOSIS — E039 Hypothyroidism, unspecified: Secondary | ICD-10-CM | POA: Diagnosis not present

## 2013-10-31 DIAGNOSIS — F329 Major depressive disorder, single episode, unspecified: Secondary | ICD-10-CM | POA: Insufficient documentation

## 2013-10-31 DIAGNOSIS — Z79899 Other long term (current) drug therapy: Secondary | ICD-10-CM | POA: Diagnosis not present

## 2013-10-31 DIAGNOSIS — I428 Other cardiomyopathies: Secondary | ICD-10-CM

## 2013-10-31 DIAGNOSIS — E669 Obesity, unspecified: Secondary | ICD-10-CM | POA: Diagnosis not present

## 2013-10-31 DIAGNOSIS — F419 Anxiety disorder, unspecified: Secondary | ICD-10-CM | POA: Insufficient documentation

## 2013-10-31 NOTE — Patient Instructions (Signed)
Doing great.  Continue current medications.  Will follow up in 1 month with ECHO.  Call any issues.  Do the following things EVERYDAY: 1) Weigh yourself in the morning before breakfast. Write it down and keep it in a log. 2) Take your medicines as prescribed 3) Eat low salt foods-Limit salt (sodium) to 2000 mg per day.  4) Stay as active as you can everyday 5) Limit all fluids for the day to less than 2 liters 6)

## 2013-10-31 NOTE — Progress Notes (Signed)
Patient ID: [name], female   DOB: Jun 18, 1955, 58 y.o.   MRN: [id number]  PCP: Dr. [facility]) Primary Cardiologist: Dr. Carlyle Dolly  HPI:  [name] is a a 58 yo female with a history of obesity, depression/anxiety, LBBB, hypothyroidism and chronic systolic HF.   Admitted for SOB 8/28-09/11/13 and found to have newly diagnosed systolic HF. Underwent R/LHC which showed normal filling pressures and nl coronaries. EF 15-20%  on ECHO (08/2013)   Follow up for Heart Failure: Doing well. Went to ED 10/14 after going to Urgent Care for CP. No abnormalities found in ED. Today reports a little more winded but maybe related to allergies. Reports "twinges" in her chest that is not constant. Denies SOB, PND, orthopnea or LE edema. Weight at home 176-178 lbs. Following a low salt diet and drinking less than 2L a day. Walking on treadmill daily for about 20 minutes with no issues. LifeVest with no alarms or shocks.   ROS: All systems negative except as listed in HPI, PMH and Problem List.  SH:  History   Social History  . Marital Status: Widowed    Spouse Name: N/A    Number of Children: N/A  . Years of Education: N/A   Occupational History  . Not on file.   Social History Main Topics  . Smoking status: Former Smoker -- 1.00 packs/day for 16 years    Types: Cigarettes    Start date: 04/22/1972    Quit date: 09/25/1988  . Smokeless tobacco: Never Used     Comment: Quit around 53  . Alcohol Use: Yes     Comment: occasionally  . Drug Use: No  . Sexual Activity: Not on file   Other Topics Concern  . Not on file   Social History Narrative   Lives in [place] by herself and daughter lives next door. Retired [place]    FH:  Family History  Problem Relation Age of Onset  . Hypertension Mother     deceased: adenocarcinoma, HLD  . Cancer Father     deceased: basal cell carcinoma    Past Medical History  Diagnosis Date  . Asthma   . Bronchitis   . PNA (pneumonia)   .  Hypothyroidism   . Fibromyalgia   . Depression   . Anxiety   . Chronic systolic HF (heart failure)     a. NICM b. RHC (09/08/13): RA: 2, RV 26/0/2, PA: 25/9 (15), PCWP: 9, Fick CO/CI: 4.6 / 2.5, PA 68% c. ECHO (08/2013) EF 20-25%, grade II DD, RV sys fx mildly reduced, mod TR  . At risk for sudden cardiac death, life vest    . NICM (nonischemic cardiomyopathy)     a. LHC (08/2013): no angiographic evidence of CAD  . Left bundle branch block     Current Outpatient Prescriptions  Medication Sig Dispense Refill  . acetaminophen (TYLENOL) 325 MG tablet Take 2 tablets (650 mg total) by mouth every 6 (six) hours as needed for mild pain or moderate pain.      Marland Kitchen aspirin EC 81 MG EC tablet Take 1 tablet (81 mg total) by mouth daily.      . cyclobenzaprine (FLEXERIL) 10 MG tablet Take 10 mg by mouth 2 (two) times daily as needed for muscle spasms.       . diazepam (VALIUM) 5 MG tablet Take 2.5-5 mg by mouth at bedtime as needed for anxiety.      . diclofenac sodium (VOLTAREN) 1 % GEL Apply  1 application topically daily as needed (fibromyalgia).      . furosemide (LASIX) 40 MG tablet Take 60 mg by mouth daily.      Marland Kitchen HYDROcodone-acetaminophen (NORCO/VICODIN) 5-325 MG per tablet Take 1 tablet by mouth 2 (two) times daily as needed for moderate pain.       Marland Kitchen levothyroxine (SYNTHROID, LEVOTHROID) 150 MCG tablet Take 150 mcg by mouth daily before breakfast.      . lisinopril (PRINIVIL,ZESTRIL) 2.5 MG tablet Take 1 tablet (2.5 mg total) by mouth at bedtime.  30 tablet  6  . metoprolol succinate (TOPROL-XL) 25 MG 24 hr tablet Take 0.5 tablets (12.5 mg total) by mouth daily.  30 tablet  6  . polyethylene glycol (MIRALAX / GLYCOLAX) packet Take 17 g by mouth daily as needed for moderate constipation.  14 each  0  . sertraline (ZOLOFT) 100 MG tablet Take 100 mg by mouth daily.       No current facility-administered medications for this encounter.    Filed Vitals:   10/31/13 1050  BP: 86/64  Pulse: 68   Resp: 18  Weight: 176 lb 8 oz (80.06 kg)  SpO2: 100%    PHYSICAL EXAM: General:  Well appearing. No resp difficulty, family present.  HEENT: normal Neck: supple. JVP flat. Carotids 2+ bilaterally; no bruits. No lymphadenopathy or thryomegaly appreciated. Cor: PMI normal. Regular rate & rhythm. No rubs, gallops or murmurs. Lungs: clear Abdomen: soft, nontender, nondistended. No hepatosplenomegaly. No bruits or masses. Good bowel sounds. Extremities: no cyanosis, clubbing, rash, edema Neuro: alert & orientedx3, cranial nerves grossly intact. Moves all 4 extremities w/o difficulty. Affect pleasant.   ASSESSMENT & PLAN:  1) Chronic systolic HF: NICM, EF 56-38%, grade II DD, RV sys fx mildly reduced, mod TR (08/2013) - NYHA II symptoms and volume status stable. Will continue lasix 60 mg daily. Reviewed last BMET and creatinine stable.  - Continue Toprol 12.5 mg daily and lisinopril 2.5 mg daily. Will not titrate with soft BP. - Did not tolerate low dose spiro with hypotension will try to add back later on. - Will need repeat ECHO next month. If remains less than 35% will refer to EP for consideration of CRT-D, QRS 166 mscec. Continue LifeVest.  - Discussed the role of the HF transitions clinic. We discussed the need to take medications daily, weigh daily, follow low sodium diet and restrict fluids to less than 2L a day. Will eventually try to transition back to Dr. Harl Bowie. 2) LBBB - As above will repeat ECHO in 3 months and may need referral for CRT-D 3) Obesity - Encouraged to try and lose weight and watch portion control. 4) Snores - Has sleep study set up in next couple weeks. 5) Anxiety - I think the patients anxiety contributes to her SOB and chest pressure. She was in the ED 10/14 for CP. Discussed with her again that her cath showed normal coronaries.   F/U 1 month with ECHO [name] B NP-C

## 2013-11-20 ENCOUNTER — Ambulatory Visit: Payer: 59 | Admitting: Cardiology

## 2013-11-25 ENCOUNTER — Encounter: Payer: 59 | Admitting: Cardiology

## 2013-11-25 NOTE — Progress Notes (Deleted)
Clinical Summary [name] is a 58 y.o.female  1. Chronic systolic HF/NICM - new diagnosis during recent admit 08/2013, LVEF 15-20%, grade II diastolic dysfunction, moderate TR with PASP 57 - TSH 4.83 - cath 09/08/13 patent coronaries with normal filling pressures - initiated on medical therapy, initially limited due to low blood pressures.  - discharged with lifevest - diuresed 3.9 liters, discharge weight 179 lbs. Discharged on lasix 40mg  daily - was started on spironolactone, had severe dizziness and discontinued.   - notes 4 lbs weight gain, feels some abdominal swelling. Baseline weight around 177-178, Limiting sodium intake. Avoiding NSAIDs.  2. Chronic LBBB - negative cath.   3. OSA screen + snoring, no witnessed apneic episode. + day time fatigue Past Medical History  Diagnosis Date  . Asthma   . Bronchitis   . PNA (pneumonia)   . Hypothyroidism   . Fibromyalgia   . Depression   . Anxiety   . Chronic systolic HF (heart failure)     a. NICM b. RHC (09/08/13): RA: 2, RV 26/0/2, PA: 25/9 (15), PCWP: 9, Fick CO/CI: 4.6 / 2.5, PA 68% c. ECHO (08/2013) EF 20-25%, grade II DD, RV sys fx mildly reduced, mod TR  . At risk for sudden cardiac death, life vest    . NICM (nonischemic cardiomyopathy)     a. LHC (08/2013): no angiographic evidence of CAD  . Left bundle [name] block      Allergies  Allergen Reactions  . Nucynta [Tapentadol] Itching  . Percocet [Oxycodone-Acetaminophen] Itching     Current Outpatient Prescriptions  Medication Sig Dispense Refill  . acetaminophen (TYLENOL) 325 MG tablet Take 2 tablets (650 mg total) by mouth every 6 (six) hours as needed for mild pain or moderate pain.    Marland Kitchen aspirin EC 81 MG EC tablet Take 1 tablet (81 mg total) by mouth daily.    . cyclobenzaprine (FLEXERIL) 10 MG tablet Take 10 mg by mouth 2 (two) times daily as needed for muscle spasms.     . diazepam (VALIUM) 5 MG tablet Take 2.5-5 mg by mouth at bedtime as needed for  anxiety.    . diclofenac sodium (VOLTAREN) 1 % GEL Apply 1 application topically daily as needed (fibromyalgia).    . furosemide (LASIX) 40 MG tablet Take 60 mg by mouth daily.    Marland Kitchen HYDROcodone-acetaminophen (NORCO/VICODIN) 5-325 MG per tablet Take 1 tablet by mouth 2 (two) times daily as needed for moderate pain.     Marland Kitchen levothyroxine (SYNTHROID, LEVOTHROID) 150 MCG tablet Take 150 mcg by mouth daily before breakfast.    . lisinopril (PRINIVIL,ZESTRIL) 2.5 MG tablet Take 1 tablet (2.5 mg total) by mouth at bedtime. 30 tablet 6  . metoprolol succinate (TOPROL-XL) 25 MG 24 hr tablet Take 0.5 tablets (12.5 mg total) by mouth daily. 30 tablet 6  . polyethylene glycol (MIRALAX / GLYCOLAX) packet Take 17 g by mouth daily as needed for moderate constipation. 14 each 0  . sertraline (ZOLOFT) 100 MG tablet Take 100 mg by mouth daily.     No current facility-administered medications for this visit.     Past Surgical History  Procedure Laterality Date  . Abdominal hysterectomy    . Shoulder open rotator cuff repair Left   . Laparoscopic gastric banding       Allergies  Allergen Reactions  . Nucynta [Tapentadol] Itching  . Percocet [Oxycodone-Acetaminophen] Itching      Family History  Problem Relation Age of Onset  .  Hypertension Mother     deceased: adenocarcinoma, HLD  . Cancer Father     deceased: basal cell carcinoma     Social History [name] reports that she quit smoking about 25 years ago. Her smoking use included Cigarettes. She started smoking about 41 years ago. She has a 16 pack-year smoking history. She has never used smokeless tobacco. [name] reports that she drinks alcohol.   Review of Systems CONSTITUTIONAL: No weight loss, fever, chills, weakness or fatigue.  HEENT: Eyes: No visual loss, blurred vision, double vision or yellow sclerae.No hearing loss, sneezing, congestion, runny nose or sore throat.  SKIN: No rash or itching.  CARDIOVASCULAR:  RESPIRATORY:  No shortness of breath, cough or sputum.  GASTROINTESTINAL: No anorexia, nausea, vomiting or diarrhea. No abdominal pain or blood.  GENITOURINARY: No burning on urination, no polyuria NEUROLOGICAL: No headache, dizziness, syncope, paralysis, ataxia, numbness or tingling in the extremities. No change in bowel or bladder control.  MUSCULOSKELETAL: No muscle, back pain, joint pain or stiffness.  LYMPHATICS: No enlarged nodes. No history of splenectomy.  PSYCHIATRIC: No history of depression or anxiety.  ENDOCRINOLOGIC: No reports of sweating, cold or heat intolerance. No polyuria or polydipsia.  Marland Kitchen   Physical Examination There were no vitals filed for this visit. There were no vitals filed for this visit.  Gen: resting comfortably, no acute distress HEENT: no scleral icterus, pupils equal round and reactive, no palptable cervical adenopathy,  CV Resp: Clear to auscultation bilaterally GI: abdomen is soft, non-tender, non-distended, normal bowel sounds, no hepatosplenomegaly MSK: extremities are warm, no edema.  Skin: warm, no rash Neuro:  no focal deficits Psych: appropriate affect   Diagnostic Studies 09/08/13 Cath Hemodynamic Findings:  Ao: 94/66  LV: 93/4/8  RA: 2  RV: 26/0/2  PA: 25/9 (mean 15)  PCWP: 9  Fick Cardiac Output: 4.6 L/min  Fick Cardiac Index: 2.5 L/min/m2  Central Aortic Saturation: 96%  Pulmonary Artery Saturation: 68%  Angiographic Findings:  Left main: No obstructive disease.  Left Anterior Descending Artery: Large caliber vessel that courses to the apex. There are two large caliber diagonal branches. No obstructive disease.  Circumflex Artery: Large caliber vessel with a small caliber first obtuse marginal [name] and a large caliber bifurcating second obtuse marginal [name]. No obstructive disease.  Right Coronary Artery: Large dominant vessel with no obstructive disease.  Left Ventricular Angiogram: Deferred.  Impression:  1. No  angiographic evidence of CAD  2. Normal filling pressures.  3. Non-ischemic cardiomyopathy  Recommendations: Continue medical management of her non-ischemic cardiomyopathy. Continue beta blocker. Would add Ace-inh as BP tolerates.  Complications: None; patient tolerated the procedure well.  08/2013 Echo Study Conclusions  - Left ventricle: There is diffuse hypokinesis with akinesis of the entire anterior, basal and mid anterolateral walls and paradoxical septal motion. The cavity size was severely dilated. Systolic function was severely reduced. The estimated ejection fraction was in the range of 15-20%. Features are consistent with a pseudonormal left ventricular filling pattern, with concomitant abnormal relaxation and increased filling pressure (grade 2 diastolic dysfunction). Doppler parameters are consistent with elevated ventricular end-diastolic filling pressure. - Aortic valve: Trileaflet; normal thickness leaflets. There was no regurgitation. - Aortic root: The aortic root was normal in size. - Left atrium: The atrium was mildly dilated. - Right ventricle: Systolic function was mildly reduced. - Right atrium: The atrium was normal in size. - Tricuspid valve: There was moderate regurgitation. - Pulmonary arteries: Systolic pressure was moderately to severely increased. PA peak  pressure: 57 mm Hg (S).  Impressions:  - Severe dilatation of the left ventricle with severely decreased LVEF 15-20%. Elevated filling pressures. Regional wall motion abnormalities consistent with LBBB and suggestive of infarct in LAD territory. Moderate mitral and tricuspid regurgitation. Mild right ventricular systolic impairement and moderate to severe pulmonary hypertension.      Assessment and Plan   1. Chronic systolic HF - new diagnosis during admission 08/2013, LVEF 15-20%, NYHA III, NICM. She does not have ICD, has life vest while undergoing titration of medications. - recently  started on aldactone but could not tolerate due to dizziness - continue current meds, titration limited due to low blood pressures. Likely next visit try changing to Toprol XL to bid with night dose before bed - 4 lbs weight gain over the last few days, some increased abomdinal distension. She will take lasix 40mg  in AM and 20mg  at night over the weekend, to call us back if not improved  2. OSA screen - several symptoms of possible OSA, refer to sleep medicine  3. Chronic LBBB - cath without CAD - if needs ICD, consider CRT     Arnoldo Lenis, M.D

## 2013-12-03 ENCOUNTER — Ambulatory Visit (HOSPITAL_BASED_OUTPATIENT_CLINIC_OR_DEPARTMENT_OTHER)
Admission: RE | Admit: 2013-12-03 | Discharge: 2013-12-03 | Disposition: A | Discharge status: Home / Self Care | Payer: 59 | Source: Ambulatory Visit | Attending: Internal Medicine | Admitting: Internal Medicine

## 2013-12-03 ENCOUNTER — Encounter (HOSPITAL_COMMUNITY): Payer: Self-pay

## 2013-12-03 ENCOUNTER — Ambulatory Visit (HOSPITAL_COMMUNITY)
Admission: RE | Admit: 2013-12-03 | Discharge: 2013-12-03 | Disposition: A | Discharge status: Home / Self Care | Payer: 59 | Source: Ambulatory Visit | Attending: Internal Medicine | Admitting: Internal Medicine

## 2013-12-03 VITALS — BP 88/60 | HR 71 | Wt 175.4 lb

## 2013-12-03 DIAGNOSIS — I447 Left bundle-branch block, unspecified: Secondary | ICD-10-CM

## 2013-12-03 DIAGNOSIS — I059 Rheumatic mitral valve disease, unspecified: Secondary | ICD-10-CM

## 2013-12-03 DIAGNOSIS — F329 Major depressive disorder, single episode, unspecified: Secondary | ICD-10-CM | POA: Diagnosis not present

## 2013-12-03 DIAGNOSIS — M797 Fibromyalgia: Secondary | ICD-10-CM | POA: Insufficient documentation

## 2013-12-03 DIAGNOSIS — E669 Obesity, unspecified: Secondary | ICD-10-CM | POA: Insufficient documentation

## 2013-12-03 DIAGNOSIS — Z7982 Long term (current) use of aspirin: Secondary | ICD-10-CM | POA: Diagnosis not present

## 2013-12-03 DIAGNOSIS — I5022 Chronic systolic (congestive) heart failure: Secondary | ICD-10-CM | POA: Insufficient documentation

## 2013-12-03 DIAGNOSIS — I429 Cardiomyopathy, unspecified: Secondary | ICD-10-CM | POA: Diagnosis not present

## 2013-12-03 DIAGNOSIS — Z87891 Personal history of nicotine dependence: Secondary | ICD-10-CM | POA: Diagnosis not present

## 2013-12-03 DIAGNOSIS — Z79899 Other long term (current) drug therapy: Secondary | ICD-10-CM | POA: Insufficient documentation

## 2013-12-03 DIAGNOSIS — F419 Anxiety disorder, unspecified: Secondary | ICD-10-CM | POA: Insufficient documentation

## 2013-12-03 DIAGNOSIS — E039 Hypothyroidism, unspecified: Secondary | ICD-10-CM | POA: Diagnosis not present

## 2013-12-03 DIAGNOSIS — G4733 Obstructive sleep apnea (adult) (pediatric): Secondary | ICD-10-CM | POA: Diagnosis not present

## 2013-12-03 MED ORDER — FUROSEMIDE 40 MG PO TABS: 40.0000 mg | ORAL_TABLET | Freq: Every day | ORAL | Status: DC

## 2013-12-03 MED ORDER — DIGOXIN 125 MCG PO TABS: 0.1250 mg | ORAL_TABLET | Freq: Every day | ORAL | Status: DC

## 2013-12-03 NOTE — Progress Notes (Signed)
  Echocardiogram 2D Echocardiogram has been performed.  [name] 12/03/2013, 10:57 AM

## 2013-12-03 NOTE — Patient Instructions (Addendum)
Decrease Furosemide to 40 mg daily  Start Digoxin 0.125 mg daily  You have been referred to Dr Caryl Comes for a defibrillator   Your physician recommends that you schedule a follow-up appointment in: 3 weeks

## 2013-12-03 NOTE — Progress Notes (Signed)
Patient ID: [name], female   DOB: [birth year], 57 y.o.   MRN: [id number]  PCP: Dr. Woody Morgan [facility]) Primary Cardiologist: Dr. Carlyle Morgan  HPI:  [name] is a a 58 yo female with a history of obesity, depression/anxiety, LBBB, hypothyroidism and chronic systolic HF.   Admitted for SOB 8/28-09/11/13 and found to have newly diagnosed systolic HF. Underwent R/LHC which showed normal filling pressures and nl coronaries. EF 15-20%  on ECHO (08/2013)   Follow up for Heart Failure: Unable to tolerate spiro due to dizziness.  Doing well. Says she gets winded and dizzy with ADLs. Can sweep the floors but has to take breaks in between. Denies edema, orthopnea or PND.  Weight at home 175-177 lbs. Following a low salt diet and drinking less than 2L a day. BP remains soft. Walking on treadmill daily for about 20-30 minutes at 2.0-2.76mph with no incline. LifeVest with no alarms or shocks.   Had sleep study with Dr. Redmond Morgan in [place] last month. AHI 17. Pending CPAP   Echo 11/25  Markedly dilated LV EF 20-25% with severe dysynchrony. RV normal.  ROS: All systems negative except as listed in HPI, PMH and Problem List.  SH:  History   Social History  . Marital Status: Widowed    Spouse Name: N/A    Number of Children: N/A  . Years of Education: N/A   Occupational History  . Not on file.   Social History Main Topics  . Smoking status: Former Smoker -- 1.00 packs/day for 16 years    Types: Cigarettes    Start date: 04/22/1972    Quit date: 09/25/1988  . Smokeless tobacco: Never Used     Comment: Quit around 86  . Alcohol Use: Yes     Comment: occasionally  . Drug Use: No  . Sexual Activity: Not on file   Other Topics Concern  . Not on file   Social History Narrative   Lives in [name] by herself and daughter lives next door. Retired [place]    FH:  Family History  Problem Relation Age of Onset  . Hypertension Mother     deceased: adenocarcinoma, HLD  . Cancer Father     deceased:  basal cell carcinoma    Past Medical History  Diagnosis Date  . Asthma   . Bronchitis   . PNA (pneumonia)   . Hypothyroidism   . Fibromyalgia   . Depression   . Anxiety   . Chronic systolic HF (heart failure)     a. NICM b. RHC (09/08/13): RA: 2, RV 26/0/2, PA: 25/9 (15), PCWP: 9, Fick CO/CI: 4.6 / 2.5, PA 68% c. ECHO (08/2013) EF 20-25%, grade II DD, RV sys fx mildly reduced, mod TR  . At risk for sudden cardiac death, life vest    . NICM (nonischemic cardiomyopathy)     a. LHC (08/2013): no angiographic evidence of CAD  . Left bundle branch block     Current Outpatient Prescriptions  Medication Sig Dispense Refill  . acetaminophen (TYLENOL) 325 MG tablet Take 2 tablets (650 mg total) by mouth every 6 (six) hours as needed for mild pain or moderate pain.    Marland Kitchen aspirin EC 81 MG EC tablet Take 1 tablet (81 mg total) by mouth daily.    . diazepam (VALIUM) 5 MG tablet Take 2.5-5 mg by mouth at bedtime as needed for anxiety.    . diclofenac sodium (VOLTAREN) 1 % GEL Apply 1 application topically daily as needed (fibromyalgia).    Marland Kitchen  furosemide (LASIX) 40 MG tablet Take 60 mg by mouth daily.    Marland Kitchen HYDROcodone-acetaminophen (NORCO/VICODIN) 5-325 MG per tablet Take 1 tablet by mouth 2 (two) times daily as needed for moderate pain.     Marland Kitchen levothyroxine (SYNTHROID, LEVOTHROID) 150 MCG tablet Take 150 mcg by mouth daily before breakfast.    . lisinopril (PRINIVIL,ZESTRIL) 2.5 MG tablet Take 1 tablet (2.5 mg total) by mouth at bedtime. 30 tablet 6  . metoprolol succinate (TOPROL-XL) 25 MG 24 hr tablet Take 0.5 tablets (12.5 mg total) by mouth daily. 30 tablet 6  . polyethylene glycol (MIRALAX / GLYCOLAX) packet Take 17 g by mouth daily as needed for moderate constipation. 14 each 0  . sertraline (ZOLOFT) 100 MG tablet Take 100 mg by mouth daily.     No current facility-administered medications for this encounter.    Filed Vitals:   12/03/13 1102  BP: 88/60  Pulse: 71  Weight: 175 lb 6.4 oz  (79.561 kg)  SpO2: 99%    PHYSICAL EXAM: General:  Well appearing. No resp difficulty HEENT: normal Neck: supple. JVP flat. Carotids 2+ bilaterally; no bruits. No lymphadenopathy or thryomegaly appreciated. Cor: PMI laterally displaced. Regular rate & rhythm. No rubs, gallops or murmurs. Lungs: clear Abdomen: soft, nontender, nondistended. No hepatosplenomegaly. No bruits or masses. Good bowel sounds. Extremities: no cyanosis, clubbing, rash, edema Neuro: alert & orientedx3, cranial nerves grossly intact. Moves all 4 extremities w/o difficulty. Affect pleasant.   ASSESSMENT & PLAN:  1) Chronic systolic HF: NICM, EF 18-29%, grade II DD, RV ok. Severe dyssynchrony in setting of LBBB - Echo reviewed today personally. Has severe LV dysfunction with markedly dilated. Etiology unclear - ? OSA - NYHA III symptoms. Med titration limited by symptomatic low BP. Volume status appears low to me.  - Will decrease lasix 40 mg daily. Can take extra as needed for swelling. - Add digoxin 0.125mg  daily  - Continue Toprol 12.5 mg daily and lisinopril 2.5 mg daily. Hopefully will be able to titrate once volume status improves. - Did not tolerate low dose spiro with hypotension.  - Will refer to EP for consideration of CRT-D, QRS 166 mscec. Continue LifeVest.  - Follow closely for need for advanced therapies. I will see back in 3 weeks. Knows to call if symptoms progressing.  - Start CPAP asap  2) LBBB - Refer for CRT-D 3) Obesity - Encouraged to try and lose weight and watch portion control. 4) OSA - AHI 17. Pending CPAP   Glori Bickers MD

## 2013-12-10 ENCOUNTER — Telehealth: Payer: Self-pay | Admitting: *Deleted

## 2013-12-10 NOTE — Telephone Encounter (Signed)
Patient said she was instructed to call our office by the [facility] to request a letter to travel. Patient said she was informed by their office that it was okay but she needed to also contact her regular cardiologist to provide a letter for the airlines. Patient is leaving to go out of town tomorrow afternoon. Patient informed that Dr. Harl Bowie was out of the office the rest of this week but request would be sent to NP in [place] for a response.

## 2013-12-11 ENCOUNTER — Encounter: Payer: Self-pay | Admitting: *Deleted

## 2013-12-11 NOTE — Telephone Encounter (Signed)
Patient informed. 

## 2013-12-11 NOTE — Telephone Encounter (Signed)
Ok to provide letter to allow her to fly.

## 2013-12-18 ENCOUNTER — Encounter (HOSPITAL_COMMUNITY): Payer: Self-pay | Admitting: Cardiovascular Disease

## 2013-12-19 ENCOUNTER — Ambulatory Visit (INDEPENDENT_AMBULATORY_CARE_PROVIDER_SITE_OTHER): Payer: 59 | Admitting: Internal Medicine

## 2013-12-19 ENCOUNTER — Encounter: Payer: Self-pay | Admitting: *Deleted

## 2013-12-19 ENCOUNTER — Encounter: Payer: Self-pay | Admitting: Internal Medicine

## 2013-12-19 VITALS — BP 90/56 | HR 53 | Ht 62.5 in | Wt 176.6 lb

## 2013-12-19 DIAGNOSIS — I5022 Chronic systolic (congestive) heart failure: Secondary | ICD-10-CM

## 2013-12-19 DIAGNOSIS — I447 Left bundle-branch block, unspecified: Secondary | ICD-10-CM

## 2013-12-19 DIAGNOSIS — Z01812 Encounter for preprocedural laboratory examination: Secondary | ICD-10-CM

## 2013-12-19 NOTE — Patient Instructions (Addendum)
Your physician recommends that you continue on your current medications as directed. Please refer to the Current Medication list given to you today.  Your physician recommends that you return for pre-procedure lab work on: 01/05/14  Your wound check is scheduled for 01/22/14 at 10:30 a.m. at [address].   CRT or cardiac resynchronization therapy is a treatment used to correct heart failure. When you have heart failure your heart is weakened and doesn't pump as well as it should. This therapy may help reduce symptoms and improve the quality of life.  Please see the handout/brochure given to you today to get more information of the different options of therapy.  Your physician has recommended that you have a defibrillator inserted. An implantable cardioverter defibrillator (ICD) is a small device that is placed in your chest or, in rare cases, your abdomen. This device uses electrical pulses or shocks to help control life-threatening, irregular heartbeats that could lead the heart to suddenly stop beating (sudden cardiac arrest). Leads are attached to the ICD that goes into your heart. This is done in the hospital and usually requires an overnight stay. Please see the instruction sheet given to you today for more information.

## 2013-12-19 NOTE — Progress Notes (Signed)
skc    ELECTROPHYSIOLOGY CONSULT NOTE  Patient ID: [name], MRN: [id number], DOB/AGE: 06/16/1955 58 y.o. Admit date: (Not on file) Date of Consult: 12/19/2013  Primary Physician: VYAS,DHRUV B., MD Primary Cardiologist:[facility] Chief Complaint:  Referred for consideration of CRT-D implantation.    HPI [name] is a 58 y.o. female   She has a history of nonischemic cardiomyopathy with left bundle branch block. Most recent echocardiogram 11/15 demonstrated ejection fraction of 20-25%ICD   she was originally diagnosed 8/15 when she presented with newly identified systolic heart failure. Catheterization demonstrated no obstructive coronary disease normal filling pressures. Echocardiogram with 15-20%. Guideline directed medical therapy has been undertaken but has been limited by hypotension. She currently is on low dose Ace inhibitors and beta blockers.   She has dyspnea on exertion but no nocturnal dyspnea. She has occasional peripheral edema. She's had no syncope.  Past Medical History  Diagnosis Date  . Asthma   . Bronchitis   . PNA (pneumonia)   . Hypothyroidism   . Fibromyalgia   . Depression   . Anxiety   . Chronic systolic HF (heart failure)     a. NICM b. RHC (09/08/13): RA: 2, RV 26/0/2, PA: 25/9 (15), PCWP: 9, Fick CO/CI: 4.6 / 2.5, PA 68% c. ECHO (08/2013) EF 20-25%, grade II DD, RV sys fx mildly reduced, mod TR  . At risk for sudden cardiac death, life vest    . NICM (nonischemic cardiomyopathy)     a. LHC (08/2013): no angiographic evidence of CAD  . Left bundle branch block       Surgical History:  Past Surgical History  Procedure Laterality Date  . Abdominal hysterectomy    . Shoulder open rotator cuff repair Left   . Laparoscopic gastric banding    . Left and right heart catheterization with coronary angiogram N/A 09/08/2013    Procedure: LEFT AND RIGHT HEART CATHETERIZATION WITH CORONARY ANGIOGRAM;  Surgeon: Christopher D McAlhany, MD;  Location: [facility] CATH  LAB;  Service: Cardiovascular;  Laterality: N/A;     Home Meds: Prior to Admission medications   Medication Sig Start Date End Date Taking? Authorizing Provider  acetaminophen (TYLENOL) 325 MG tablet Take 2 tablets (650 mg total) by mouth every 6 (six) hours as needed for mild pain or moderate pain. 09/10/13  Yes Laura R Ingold, NP  aspirin EC 81 MG EC tablet Take 1 tablet (81 mg total) by mouth daily. 09/10/13  Yes Laura R Ingold, NP  diazepam (VALIUM) 5 MG tablet Take 2.5-5 mg by mouth at bedtime as needed for anxiety.   Yes Historical Provider, MD  diclofenac sodium (VOLTAREN) 1 % GEL Apply 1 application topically daily as needed (fibromyalgia).   Yes Historical Provider, MD  digoxin (LANOXIN) 0.125 MG tablet Take 1 tablet (0.125 mg total) by mouth daily. 12/03/13  Yes Daniel R Bensimhon, MD  furosemide (LASIX) 40 MG tablet Take 1 tablet (40 mg total) by mouth daily. 12/03/13  Yes Daniel R Bensimhon, MD  HYDROcodone-acetaminophen (NORCO/VICODIN) 5-325 MG per tablet Take 1 tablet by mouth 2 (two) times daily as needed for moderate pain.    Yes Historical Provider, MD  levothyroxine (SYNTHROID, LEVOTHROID) 150 MCG tablet Take 150 mcg by mouth daily before breakfast.   Yes Historical Provider, MD  lisinopril (PRINIVIL,ZESTRIL) 2.5 MG tablet Take 1 tablet (2.5 mg total) by mouth at bedtime. 09/11/13  Yes Laura R Ingold, NP  metoprolol succinate (TOPROL-XL) 25 MG 24 hr tablet Take 0.5 tablets (12.5   mg total) by mouth daily. 09/10/13  Yes Laura R Ingold, NP  polyethylene glycol (MIRALAX / GLYCOLAX) packet Take 17 g by mouth daily as needed for moderate constipation. 09/10/13  Yes Laura R Ingold, NP  sertraline (ZOLOFT) 100 MG tablet Take 100 mg by mouth daily.   Yes Historical Provider, MD  tiZANidine (ZANAFLEX) 4 MG tablet Take 2 mg by mouth 2 (two) times daily as needed for muscle spasms (for fibromyalgia).   Yes Historical Provider, MD       Allergies:  Allergies  Allergen Reactions  . Nucynta  [Tapentadol] Itching  . Percocet [Oxycodone-Acetaminophen] Itching    History   Social History  . Marital Status: Widowed    Spouse Name: N/A    Number of Children: N/A  . Years of Education: N/A   Occupational History  . Not on file.   Social History Main Topics  . Smoking status: Former Smoker -- 1.00 packs/day for 16 years    Types: Cigarettes    Start date: 04/22/1972    Quit date: 09/25/1988  . Smokeless tobacco: Never Used     Comment: Quit around 1990  . Alcohol Use: Yes     Comment: occasionally  . Drug Use: No  . Sexual Activity: Not on file   Other Topics Concern  . Not on file   Social History Narrative   Lives in [place] by herself and daughter lives next door. Retired [employer]     Family History  Problem Relation Age of Onset  . Hypertension Mother     deceased: adenocarcinoma, HLD  . Cancer Father     deceased: basal cell carcinoma  . Cancer Sister     breast  . Cancer Brother     skin  . Hyperlipidemia Brother      ROS:  Please see the history of present illness.     All other systems reviewed and negative.    Physical Exam: Blood pressure 90/56, pulse 53, height 5' 2.5" (1.588 m), weight 176 lb 9.6 oz (80.105 kg). General: Well developed, well nourished female in no acute distress. Head: Normocephalic, atraumatic, sclera non-icteric, no xanthomas, nares are without discharge. EENT: normal Lymph Nodes:  none Back: without scoliosis/kyphosis, no CVA tendersness Neck: Negative for carotid bruits. JVD not elevated. Lungs: Clear bilaterally to auscultation without wheezes, rales, or rhonchi. Breathing is unlabored. Heart: RRR with S1 S2. 26 systolic murmur , rubs, or gallops appreciated. Abdomen: Soft, non-tender, non-distended with normoactive bowel sounds. No hepatomegaly. No rebound/guarding. No obvious abdominal masses. Msk:  Strength and tone appear normal for age. Extremities: No clubbing or cyanosis. No edema.  Distal pedal pulses are 2+ and  equal bilaterally. Skin: Warm and Dry Neuro: Alert and oriented X 3. CN III-XII intact Grossly normal sensory and motor function . Psych:  Responds to questions appropriately with a normal affect.      Labs: Cardiac Enzymes No results for input(s): CKTOTAL, CKMB, TROPONINI in the last 72 hours. CBC Lab Results  Component Value Date   WBC 7.0 10/22/2013   HGB 13.8 10/22/2013   HCT 41.8 10/22/2013   MCV 89.7 10/22/2013   PLT 199 10/22/2013   PROTIME: No results for input(s): LABPROT, INR in the last 72 hours. Chemistry No results for input(s): NA, K, CL, CO2, BUN, CREATININE, CALCIUM, PROT, BILITOT, ALKPHOS, ALT, AST, GLUCOSE in the last 168 hours.  Invalid input(s): LABALBU Lipids No results found for: CHOL, HDL, LDLCALC, TRIG BNP PRO B NATRIURETIC PEPTIDE (BNP)  Date/Time   Value Ref Range Status  10/22/2013 07:01 PM 2284.0* 0 - 125 pg/mL Final  10/01/2013 10:16 AM 2096.0* 0 - 125 pg/mL Final  09/10/2013 06:45 AM 2408.0* 0 - 125 pg/mL Final  09/05/2013 05:20 PM 8605.0* 0 - 125 pg/mL Final   Miscellaneous No results found for: DDIMER  Radiology/Studies:  No results found.  EKG:  Sinus rhythm with left bundle branch block Intervals 14/17/44 date 1014   Assessment and Plan:  Nonischemic cardiomyopathy  Left bundle branch block  Hypotension  Congestive heart failure-chronic-systolic  The patient has persistent left ventricular dysfunction not withstanding maximally tolerated medications. She has left bundle branch block and is a consequence is a candidate for both defibrillator support as well as cardiac resynchronization.  The hope would also be that with augmented cardiac performance her blood pressure would be sufficient to tolerate augmented medical therapy.  She had questions regarding her prognosis which we discussed.  Have reviewed the potential benefits and risks of ICD implantation including but not limited to death, perforation of heart or lung, lead  dislodgement, infection,  device malfunction and inappropriate shocks.  The patient and family express understanding  and are willing to proceed.       Steven Klein   

## 2013-12-24 ENCOUNTER — Ambulatory Visit (HOSPITAL_COMMUNITY)
Admission: RE | Admit: 2013-12-24 | Discharge: 2013-12-24 | Disposition: A | Discharge status: Home / Self Care | Payer: 59 | Source: Ambulatory Visit | Attending: Internal Medicine | Admitting: Internal Medicine

## 2013-12-24 ENCOUNTER — Encounter (HOSPITAL_COMMUNITY): Payer: Self-pay

## 2013-12-24 VITALS — BP 98/60 | HR 68 | Resp 18 | Wt 176.2 lb

## 2013-12-24 DIAGNOSIS — E039 Hypothyroidism, unspecified: Secondary | ICD-10-CM | POA: Insufficient documentation

## 2013-12-24 DIAGNOSIS — I447 Left bundle-branch block, unspecified: Secondary | ICD-10-CM | POA: Diagnosis not present

## 2013-12-24 DIAGNOSIS — I428 Other cardiomyopathies: Secondary | ICD-10-CM

## 2013-12-24 DIAGNOSIS — G4733 Obstructive sleep apnea (adult) (pediatric): Secondary | ICD-10-CM

## 2013-12-24 DIAGNOSIS — F329 Major depressive disorder, single episode, unspecified: Secondary | ICD-10-CM | POA: Insufficient documentation

## 2013-12-24 DIAGNOSIS — Z9989 Dependence on other enabling machines and devices: Secondary | ICD-10-CM

## 2013-12-24 DIAGNOSIS — E669 Obesity, unspecified: Secondary | ICD-10-CM | POA: Insufficient documentation

## 2013-12-24 DIAGNOSIS — I429 Cardiomyopathy, unspecified: Secondary | ICD-10-CM

## 2013-12-24 DIAGNOSIS — I5022 Chronic systolic (congestive) heart failure: Secondary | ICD-10-CM | POA: Insufficient documentation

## 2013-12-24 NOTE — Progress Notes (Signed)
Patient ID: AKIKO SCHEXNIDER, female   DOB: 1955-05-31, 58 y.o.   MRN: [id number]  PCP: Dr. Woody Seller [facility]) Primary Cardiologist: Dr. Carlyle Dolly  HPI:  Ms. Hoppes is a a 58 yo female with a history of obesity, depression/anxiety, LBBB, hypothyroidism and chronic systolic HF.   Admitted for SOB 8/28-09/11/13 and found to have newly diagnosed systolic HF. Underwent R/LHC which showed normal filling pressures and nl coronaries. EF 15-20%  on ECHO (08/2013)   Follow up for Heart Failure: Last visit cut lasix back to 40 mg daily and added digoxin 0.125 mg daily. Referred to [facility] and scheduled for CRT-D on January 4th. Overall still feels winded with climbing stairs or steps. +Dizziness with bending over or changing position too quickly. Able to walk about 1 mile on flat ground with no issues. Denies CP, orthopnea, PND or edema. Wearing CPAP nightly. Weight at home 175-177 lbs. Following a low salt diet and drinking less than 2L. No alarms with LifeVest and taking medications as prescribed..   Had sleep study with Dr. Redmond Pulling in [place] last month. AHI 17.   Echo 11/25  Markedly dilated LV EF 20-25% with severe dysynchrony. RV normal.  ROS: All systems negative except as listed in HPI, PMH and Problem List.  SH:  History   Social History  . Marital Status: Widowed    Spouse Name: N/A    Number of Children: N/A  . Years of Education: N/A   Occupational History  . Not on file.   Social History Main Topics  . Smoking status: Former Smoker -- 1.00 packs/day for 16 years    Types: Cigarettes    Start date: 04/22/1972    Quit date: 09/25/1988  . Smokeless tobacco: Never Used     Comment: Quit around 68  . Alcohol Use: Yes     Comment: occasionally  . Drug Use: No  . Sexual Activity: Not on file   Other Topics Concern  . Not on file   Social History Narrative   Lives in [place] by herself and daughter lives next door. Retired [employer]    FH:  Family History  Problem Relation Age of Onset   . Hypertension Mother     deceased: adenocarcinoma, HLD  . Cancer Father     deceased: basal cell carcinoma  . Cancer Sister     breast  . Cancer Brother     skin  . Hyperlipidemia Brother     Past Medical History  Diagnosis Date  . Asthma   . Bronchitis   . PNA (pneumonia)   . Hypothyroidism   . Fibromyalgia   . Depression   . Anxiety   . Chronic systolic HF (heart failure)     a. NICM b. RHC (09/08/13): RA: 2, RV 26/0/2, PA: 25/9 (15), PCWP: 9, Fick CO/CI: 4.6 / 2.5, PA 68% c. ECHO (08/2013) EF 20-25%, grade II DD, RV sys fx mildly reduced, mod TR  . At risk for sudden cardiac death, life vest    . NICM (nonischemic cardiomyopathy)     a. LHC (08/2013): no angiographic evidence of CAD  . Left bundle branch block     Current Outpatient Prescriptions  Medication Sig Dispense Refill  . acetaminophen (TYLENOL) 325 MG tablet Take 2 tablets (650 mg total) by mouth every 6 (six) hours as needed for mild pain or moderate pain.    Marland Kitchen aspirin EC 81 MG EC tablet Take 1 tablet (81 mg total) by mouth daily.    Marland Kitchen  diazepam (VALIUM) 5 MG tablet Take 2.5-5 mg by mouth at bedtime as needed for anxiety.    . diclofenac sodium (VOLTAREN) 1 % GEL Apply 1 application topically daily as needed (fibromyalgia).    Marland Kitchen digoxin (LANOXIN) 0.125 MG tablet Take 1 tablet (0.125 mg total) by mouth daily. 30 tablet 3  . furosemide (LASIX) 40 MG tablet Take 1 tablet (40 mg total) by mouth daily. 30 tablet   . HYDROcodone-acetaminophen (NORCO/VICODIN) 5-325 MG per tablet Take 1 tablet by mouth 2 (two) times daily as needed for moderate pain.     Marland Kitchen levothyroxine (SYNTHROID, LEVOTHROID) 150 MCG tablet Take 150 mcg by mouth daily before breakfast.    . lisinopril (PRINIVIL,ZESTRIL) 2.5 MG tablet Take 1 tablet (2.5 mg total) by mouth at bedtime. 30 tablet 6  . metoprolol succinate (TOPROL-XL) 25 MG 24 hr tablet Take 0.5 tablets (12.5 mg total) by mouth daily. 30 tablet 6  . polyethylene glycol (MIRALAX / GLYCOLAX)  packet Take 17 g by mouth daily as needed for moderate constipation. 14 each 0  . sertraline (ZOLOFT) 100 MG tablet Take 100 mg by mouth daily.    Marland Kitchen tiZANidine (ZANAFLEX) 4 MG tablet Take 2 mg by mouth 2 (two) times daily as needed for muscle spasms (for fibromyalgia).     No current facility-administered medications for this encounter.    Filed Vitals:   12/24/13 1044  BP: 98/60  Pulse: 68  Resp: 18  Weight: 176 lb 4 oz (79.946 kg)  SpO2: 99%    PHYSICAL EXAM: General:  Well appearing. No resp difficulty HEENT: normal Neck: supple. JVP flat. Carotids 2+ bilaterally; no bruits. No lymphadenopathy or thryomegaly appreciated. Cor: PMI laterally displaced. Regular rate & rhythm. No rubs, gallops or murmurs. LifeVest intact Lungs: clear Abdomen: soft, nontender, nondistended. No hepatosplenomegaly. No bruits or masses. Good bowel sounds. Extremities: no cyanosis, clubbing, rash, edema Neuro: alert & orientedx3, cranial nerves grossly intact. Moves all 4 extremities w/o difficulty. Affect pleasant.   ASSESSMENT & PLAN:  1) Chronic systolic HF: NICM, EF 40-10%, severe dysynchrony, RV nl. (11/2013). Etiology unclear - ? OSA - NYHA III symptoms and volume status stable. Will continue lasix 40 mg daily.  - Unable to titrate HF medications d/t soft BP and dizziness. Will continue current medications.  - Met with Dr. Caryl Comes and is scheduled for CRT-D on January 4th. Continue LifeVest currently.  - Check BMET, proBNP and digoxin level.  - Follow closely for need for advanced therapies. Low threshold to schedule RHC to assess hemodynamics.  - Reinforced the need and importance of daily weights, a low sodium diet, and fluid restriction (less than 2 L a day). Instructed to call the HF clinic if weight increases more than 3 lbs overnight or 5 lbs in a week.  2) LBBB - Pending CRT-D 3) Obesity - Encouraged to try and lose weight and watch portion control. 4) OSA - AHI 17. Wear CPAP nightly.    F/U 1 month [name]

## 2013-12-24 NOTE — Patient Instructions (Signed)
Doing great.  Have a wonderful Christmas and New Years.  Good luck on your surgery.  Follow up in 1 month  Call any issues.  Do the following things EVERYDAY: 1) Weigh yourself in the morning before breakfast. Write it down and keep it in a log. 2) Take your medicines as prescribed 3) Eat low salt foods-Limit salt (sodium) to 2000 mg per day.  4) Stay as active as you can everyday 5) Limit all fluids for the day to less than 2 liters 6)

## 2014-01-05 ENCOUNTER — Other Ambulatory Visit: Payer: 59

## 2014-01-05 ENCOUNTER — Other Ambulatory Visit (INDEPENDENT_AMBULATORY_CARE_PROVIDER_SITE_OTHER): Payer: 59

## 2014-01-05 DIAGNOSIS — Z01812 Encounter for preprocedural laboratory examination: Secondary | ICD-10-CM

## 2014-01-05 DIAGNOSIS — I447 Left bundle-branch block, unspecified: Secondary | ICD-10-CM

## 2014-01-05 DIAGNOSIS — I5022 Chronic systolic (congestive) heart failure: Secondary | ICD-10-CM

## 2014-01-05 LAB — CBC WITH DIFFERENTIAL/PLATELET
BASOS ABS: 0 10*3/uL (ref 0.0–0.1)
Basophils Relative: 0.5 % (ref 0.0–3.0)
Eosinophils Absolute: 0.2 10*3/uL (ref 0.0–0.7)
Eosinophils Relative: 4.3 % (ref 0.0–5.0)
HCT: 41.9 % (ref 36.0–46.0)
HEMOGLOBIN: 13.9 g/dL (ref 12.0–15.0)
LYMPHS ABS: 1.7 10*3/uL (ref 0.7–4.0)
Lymphocytes Relative: 31.1 % (ref 12.0–46.0)
MCHC: 33.2 g/dL (ref 30.0–36.0)
MCV: 90.5 fl (ref 78.0–100.0)
Monocytes Absolute: 0.4 10*3/uL (ref 0.1–1.0)
Monocytes Relative: 6.5 % (ref 3.0–12.0)
NEUTROS ABS: 3.2 10*3/uL (ref 1.4–7.7)
Neutrophils Relative %: 57.6 % (ref 43.0–77.0)
PLATELETS: 185 10*3/uL (ref 150.0–400.0)
RBC: 4.62 Mil/uL (ref 3.87–5.11)
RDW: 13.5 % (ref 11.5–15.5)
WBC: 5.6 10*3/uL (ref 4.0–10.5)

## 2014-01-05 LAB — BASIC METABOLIC PANEL
BUN: 24 mg/dL — ABNORMAL HIGH (ref 6–23)
CALCIUM: 10.5 mg/dL (ref 8.4–10.5)
CHLORIDE: 103 meq/L (ref 96–112)
CO2: 30 meq/L (ref 19–32)
Creatinine, Ser: 1 mg/dL (ref 0.4–1.2)
GFR: 62.24 mL/min (ref >60.00)
GLUCOSE: 139 mg/dL — AB (ref 70–99)
Potassium: 4.1 mEq/L (ref 3.5–5.1)
Sodium: 140 mEq/L (ref 135–145)

## 2014-01-06 LAB — DIGOXIN LEVEL: Digoxin Level: 1 ng/mL (ref 0.8–2.0)

## 2014-01-08 ENCOUNTER — Telehealth: Payer: Self-pay | Admitting: Internal Medicine

## 2014-01-08 NOTE — Telephone Encounter (Signed)
Pt calling stating she is having a defibrillator placed on Mon and wants to know if she should take her acrylic nails off.  Spoke w/Kelly Lanier,RN who states should not be a problem since not being put to sleep.  Advised pt that she should check with hospital but should be OK.

## 2014-01-08 NOTE — Telephone Encounter (Signed)
New message     Pt is having a defibulator put in 01-12-14.  Will she need to take her acrylic nails off?

## 2014-01-11 DIAGNOSIS — F329 Major depressive disorder, single episode, unspecified: Secondary | ICD-10-CM | POA: Diagnosis not present

## 2014-01-11 DIAGNOSIS — Z90710 Acquired absence of both cervix and uterus: Secondary | ICD-10-CM | POA: Diagnosis not present

## 2014-01-11 DIAGNOSIS — I959 Hypotension, unspecified: Secondary | ICD-10-CM | POA: Diagnosis not present

## 2014-01-11 DIAGNOSIS — I429 Cardiomyopathy, unspecified: Secondary | ICD-10-CM | POA: Diagnosis present

## 2014-01-11 DIAGNOSIS — Z7982 Long term (current) use of aspirin: Secondary | ICD-10-CM | POA: Diagnosis not present

## 2014-01-11 DIAGNOSIS — F419 Anxiety disorder, unspecified: Secondary | ICD-10-CM | POA: Diagnosis not present

## 2014-01-11 DIAGNOSIS — Z87891 Personal history of nicotine dependence: Secondary | ICD-10-CM | POA: Diagnosis not present

## 2014-01-11 DIAGNOSIS — M797 Fibromyalgia: Secondary | ICD-10-CM | POA: Diagnosis not present

## 2014-01-11 DIAGNOSIS — E039 Hypothyroidism, unspecified: Secondary | ICD-10-CM | POA: Diagnosis not present

## 2014-01-11 DIAGNOSIS — Z9884 Bariatric surgery status: Secondary | ICD-10-CM | POA: Diagnosis not present

## 2014-01-11 DIAGNOSIS — I447 Left bundle-branch block, unspecified: Secondary | ICD-10-CM | POA: Diagnosis not present

## 2014-01-11 DIAGNOSIS — I5022 Chronic systolic (congestive) heart failure: Secondary | ICD-10-CM | POA: Diagnosis not present

## 2014-01-11 DIAGNOSIS — J45909 Unspecified asthma, uncomplicated: Secondary | ICD-10-CM | POA: Diagnosis not present

## 2014-01-11 DIAGNOSIS — G4733 Obstructive sleep apnea (adult) (pediatric): Secondary | ICD-10-CM | POA: Diagnosis not present

## 2014-01-11 MED ORDER — SODIUM CHLORIDE 0.9 % IR SOLN
80.0000 mg | Status: DC
  Filled 2014-01-11: qty 2

## 2014-01-11 MED ORDER — CEFAZOLIN SODIUM-DEXTROSE 2-3 GM-% IV SOLR: 2.0000 g | INTRAVENOUS | Status: DC

## 2014-01-12 ENCOUNTER — Ambulatory Visit (HOSPITAL_COMMUNITY)
Admission: RE | Admit: 2014-01-12 | Discharge: 2014-01-13 | Disposition: A | Discharge status: Home / Self Care | Payer: 59 | Source: Ambulatory Visit | Attending: Internal Medicine | Admitting: Internal Medicine

## 2014-01-12 ENCOUNTER — Encounter (HOSPITAL_COMMUNITY)
Admission: RE | Disposition: A | Discharge status: Home / Self Care | Payer: Self-pay | Source: Ambulatory Visit | Attending: Internal Medicine

## 2014-01-12 ENCOUNTER — Encounter (HOSPITAL_COMMUNITY): Payer: Self-pay | Admitting: *Deleted

## 2014-01-12 DIAGNOSIS — J45909 Unspecified asthma, uncomplicated: Secondary | ICD-10-CM | POA: Insufficient documentation

## 2014-01-12 DIAGNOSIS — Z90710 Acquired absence of both cervix and uterus: Secondary | ICD-10-CM | POA: Insufficient documentation

## 2014-01-12 DIAGNOSIS — I428 Other cardiomyopathies: Secondary | ICD-10-CM

## 2014-01-12 DIAGNOSIS — Z01812 Encounter for preprocedural laboratory examination: Secondary | ICD-10-CM

## 2014-01-12 DIAGNOSIS — I959 Hypotension, unspecified: Secondary | ICD-10-CM | POA: Insufficient documentation

## 2014-01-12 DIAGNOSIS — I429 Cardiomyopathy, unspecified: Secondary | ICD-10-CM

## 2014-01-12 DIAGNOSIS — I447 Left bundle-branch block, unspecified: Secondary | ICD-10-CM | POA: Insufficient documentation

## 2014-01-12 DIAGNOSIS — I509 Heart failure, unspecified: Secondary | ICD-10-CM

## 2014-01-12 DIAGNOSIS — E039 Hypothyroidism, unspecified: Secondary | ICD-10-CM | POA: Insufficient documentation

## 2014-01-12 DIAGNOSIS — G4733 Obstructive sleep apnea (adult) (pediatric): Secondary | ICD-10-CM | POA: Insufficient documentation

## 2014-01-12 DIAGNOSIS — Z959 Presence of cardiac and vascular implant and graft, unspecified: Secondary | ICD-10-CM

## 2014-01-12 DIAGNOSIS — Z95810 Presence of automatic (implantable) cardiac defibrillator: Secondary | ICD-10-CM

## 2014-01-12 DIAGNOSIS — F419 Anxiety disorder, unspecified: Secondary | ICD-10-CM | POA: Insufficient documentation

## 2014-01-12 DIAGNOSIS — Z87891 Personal history of nicotine dependence: Secondary | ICD-10-CM | POA: Insufficient documentation

## 2014-01-12 DIAGNOSIS — I5022 Chronic systolic (congestive) heart failure: Secondary | ICD-10-CM | POA: Insufficient documentation

## 2014-01-12 DIAGNOSIS — Z7982 Long term (current) use of aspirin: Secondary | ICD-10-CM | POA: Insufficient documentation

## 2014-01-12 DIAGNOSIS — M797 Fibromyalgia: Secondary | ICD-10-CM | POA: Insufficient documentation

## 2014-01-12 DIAGNOSIS — F329 Major depressive disorder, single episode, unspecified: Secondary | ICD-10-CM | POA: Insufficient documentation

## 2014-01-12 DIAGNOSIS — Z9884 Bariatric surgery status: Secondary | ICD-10-CM | POA: Insufficient documentation

## 2014-01-12 HISTORY — DX: Presence of automatic (implantable) cardiac defibrillator: Z95.810

## 2014-01-12 HISTORY — PX: BI-VENTRICULAR IMPLANTABLE CARDIOVERTER DEFIBRILLATOR: SHX5459

## 2014-01-12 LAB — SURGICAL PCR SCREEN
MRSA, PCR: NEGATIVE
Staphylococcus aureus: NEGATIVE

## 2014-01-12 SURGERY — BI-VENTRICULAR IMPLANTABLE CARDIOVERTER DEFIBRILLATOR  (CRT-D)
Anesthesia: LOCAL

## 2014-01-12 MED ORDER — HYDROCODONE-ACETAMINOPHEN 5-325 MG PO TABS: 1.0000 | ORAL_TABLET | Freq: Two times a day (BID) | ORAL | Status: DC | PRN

## 2014-01-12 MED ORDER — DIAZEPAM 5 MG PO TABS: 2.5000 mg | ORAL_TABLET | Freq: Every evening | ORAL | Status: DC | PRN

## 2014-01-12 MED ORDER — FENTANYL CITRATE 0.05 MG/ML IJ SOLN
INTRAMUSCULAR | Status: AC
  Filled 2014-01-12: qty 2

## 2014-01-12 MED ORDER — CEFAZOLIN SODIUM 1-5 GM-% IV SOLN
1.0000 g | Freq: Four times a day (QID) | INTRAVENOUS | Status: AC
  Administered 2014-01-12 – 2014-01-13 (×3): 1 g via INTRAVENOUS
  Filled 2014-01-12 (×4): qty 50

## 2014-01-12 MED ORDER — FUROSEMIDE 40 MG PO TABS
40.0000 mg | ORAL_TABLET | Freq: Every day | ORAL | Status: DC
  Administered 2014-01-13: 40 mg via ORAL
  Filled 2014-01-12: qty 1

## 2014-01-12 MED ORDER — MUPIROCIN 2 % EX OINT
1.0000 "application " | TOPICAL_OINTMENT | Freq: Once | CUTANEOUS | Status: AC
  Administered 2014-01-12: 1 via TOPICAL

## 2014-01-12 MED ORDER — DIGOXIN 125 MCG PO TABS
0.1250 mg | ORAL_TABLET | Freq: Every day | ORAL | Status: DC
  Administered 2014-01-13: 0.125 mg via ORAL
  Filled 2014-01-12: qty 1

## 2014-01-12 MED ORDER — SODIUM CHLORIDE 0.9 % IV SOLN
INTRAVENOUS | Status: AC
  Administered 2014-01-12: 13:00:00 via INTRAVENOUS

## 2014-01-12 MED ORDER — LEVOTHYROXINE SODIUM 75 MCG PO TABS
150.0000 ug | ORAL_TABLET | Freq: Every day | ORAL | Status: DC
  Administered 2014-01-13: 150 ug via ORAL
  Filled 2014-01-12: qty 2

## 2014-01-12 MED ORDER — ONDANSETRON HCL 4 MG/2ML IJ SOLN: 4.0000 mg | Freq: Four times a day (QID) | INTRAMUSCULAR | Status: DC | PRN

## 2014-01-12 MED ORDER — MUPIROCIN 2 % EX OINT
TOPICAL_OINTMENT | CUTANEOUS | Status: AC
  Administered 2014-01-12: 1 via TOPICAL
  Filled 2014-01-12: qty 22

## 2014-01-12 MED ORDER — SODIUM CHLORIDE 0.9 % IV SOLN: INTRAVENOUS | Status: DC

## 2014-01-12 MED ORDER — MIDAZOLAM HCL 5 MG/5ML IJ SOLN
INTRAMUSCULAR | Status: AC
  Filled 2014-01-12: qty 5

## 2014-01-12 MED ORDER — LISINOPRIL 2.5 MG PO TABS
2.5000 mg | ORAL_TABLET | Freq: Every day | ORAL | Status: DC
  Filled 2014-01-12: qty 1

## 2014-01-12 MED ORDER — METOPROLOL SUCCINATE 12.5 MG HALF TABLET
12.5000 mg | ORAL_TABLET | Freq: Every day | ORAL | Status: DC
  Filled 2014-01-12 (×2): qty 1

## 2014-01-12 MED ORDER — SODIUM CHLORIDE 0.9 % IV SOLN
INTRAVENOUS | Status: DC
  Administered 2014-01-12: 09:00:00 via INTRAVENOUS

## 2014-01-12 MED ORDER — TIZANIDINE HCL 2 MG PO TABS
2.0000 mg | ORAL_TABLET | Freq: Two times a day (BID) | ORAL | Status: DC | PRN
  Administered 2014-01-12 – 2014-01-13 (×2): 2 mg via ORAL
  Filled 2014-01-12 (×4): qty 1

## 2014-01-12 MED ORDER — LIDOCAINE HCL (PF) 1 % IJ SOLN
INTRAMUSCULAR | Status: AC
  Filled 2014-01-12: qty 60

## 2014-01-12 MED ORDER — ASPIRIN EC 81 MG PO TBEC
81.0000 mg | DELAYED_RELEASE_TABLET | Freq: Every day | ORAL | Status: DC
  Administered 2014-01-13: 81 mg via ORAL
  Filled 2014-01-12: qty 1

## 2014-01-12 MED ORDER — ACETAMINOPHEN 325 MG PO TABS: 650.0000 mg | ORAL_TABLET | Freq: Four times a day (QID) | ORAL | Status: DC | PRN

## 2014-01-12 MED ORDER — HYDROCODONE-ACETAMINOPHEN 5-325 MG PO TABS
1.0000 | ORAL_TABLET | ORAL | Status: DC | PRN
Start: 2014-01-12 — End: 2014-01-13
  Administered 2014-01-12 – 2014-01-13 (×3): 2 via ORAL
  Filled 2014-01-12 (×3): qty 2

## 2014-01-12 MED ORDER — CHLORHEXIDINE GLUCONATE 4 % EX LIQD: 60.0000 mL | Freq: Once | CUTANEOUS | Status: DC

## 2014-01-12 MED ORDER — POLYETHYLENE GLYCOL 3350 17 G PO PACK
17.0000 g | PACK | Freq: Every day | ORAL | Status: DC | PRN
Start: 2014-01-12 — End: 2014-01-13

## 2014-01-12 MED ORDER — SERTRALINE HCL 100 MG PO TABS
100.0000 mg | ORAL_TABLET | Freq: Every day | ORAL | Status: DC
  Administered 2014-01-13: 100 mg via ORAL
  Filled 2014-01-12: qty 1

## 2014-01-12 MED ORDER — SODIUM CHLORIDE 0.9 % IV BOLUS (SEPSIS)
200.0000 mL | Freq: Once | INTRAVENOUS | Status: AC
  Administered 2014-01-12: 200 mL via INTRAVENOUS

## 2014-01-12 NOTE — CV Procedure (Signed)
[name] [id number]  [id number]  Preop [id number] cardiomyopathy LBBB CHF class 3  Postop Dx same/   Procedure: dual chamber ICD implantation with LV lead placement  Cx: None   EBL: Minimal    Dictation number [id number]  Virl Axe, MD 01/12/2014 12:30 PM

## 2014-01-12 NOTE — Progress Notes (Signed)
UR Completed Ravenne Wayment Graves-Bigelow, RN,BSN [phone]  

## 2014-01-12 NOTE — Interval H&P Note (Signed)
ICD Criteria  Current LVEF:25% ;Obtained > or = 1 month ago and < or = 3 months ago.  NYHA Functional Classification: Class III  Heart Failure History:  Yes, Duration of heart failure since onset is 3 to 9 months  Non-Ischemic Dilated Cardiomyopathy History:  Yes, timeframe is 3 to 9 months  Atrial Fibrillation/Atrial Flutter:  No.  Ventricular Tachycardia History:  No.  Cardiac Arrest History:  No  History of Syndromes with Risk of Sudden Death:  No.  Previous ICD:  No.  Electrophysiology Study: No.  Prior MI: No.  PPM: No.  OSA:  Yes  Patient Life Expectancy of >=1 year: Yes.  Anticoagulation Therapy:  Patient is NOT on anticoagulation therapy.   Beta Blocker Therapy:  Yes.   Ace Inhibitor/ARB Therapy:  Yes.History and Physical Interval Note:  01/12/2014 8:06 AM  [name]  has presented today for surgery, with the diagnosis of chronic systolic heart failure  The various methods of treatment have been discussed with the patient and family. After consideration of risks, benefits and other options for treatment, the patient has consented to  Procedure(s): BI-VENTRICULAR IMPLANTABLE CARDIOVERTER DEFIBRILLATOR  (CRT-D) (N/A) as a surgical intervention .  The patient's history has been reviewed, patient examined, no change in status, stable for surgery.  I have reviewed the patient's chart and labs.  Questions were answered to the patient's satisfaction.     Virl Axe

## 2014-01-12 NOTE — H&P (View-Only) (Signed)
skc    ELECTROPHYSIOLOGY CONSULT NOTE  Patient ID: [name], MRN: [id number], DOB/AGE: 57/18/1957 57 y.o. Admit date: (Not on file) Date of Consult: 12/19/2013  Primary Physician: Glenda Chroman., MD Primary Cardiologist:CHF Chief Complaint:  Referred for consideration of CRT-D implantation.    HPI [name] is a 59 y.o. female   She has a history of nonischemic cardiomyopathy with left bundle branch block. Most recent echocardiogram 11/15 demonstrated ejection fraction of 20-25%ICD   she was originally diagnosed 8/15 when she presented with newly identified systolic heart failure. Catheterization demonstrated no obstructive coronary disease normal filling pressures. Echocardiogram with 15-20%. Guideline directed medical therapy has been undertaken but has been limited by hypotension. She currently is on low dose Ace inhibitors and beta blockers.   She has dyspnea on exertion but no nocturnal dyspnea. She has occasional peripheral edema. She's had no syncope.  Past Medical History  Diagnosis Date  . Asthma   . Bronchitis   . PNA (pneumonia)   . Hypothyroidism   . Fibromyalgia   . Depression   . Anxiety   . Chronic systolic HF (heart failure)     a. NICM b. RHC (09/08/13): RA: 2, RV 26/0/2, PA: 25/9 (15), PCWP: 9, Fick CO/CI: 4.6 / 2.5, PA 68% c. ECHO (08/2013) EF 20-25%, grade II DD, RV sys fx mildly reduced, mod TR  . At risk for sudden cardiac death, life vest    . NICM (nonischemic cardiomyopathy)     a. LHC (08/2013): no angiographic evidence of CAD  . Left bundle branch block       Surgical History:  Past Surgical History  Procedure Laterality Date  . Abdominal hysterectomy    . Shoulder open rotator cuff repair Left   . Laparoscopic gastric banding    . Left and right heart catheterization with coronary angiogram N/A 09/08/2013    Procedure: LEFT AND RIGHT HEART CATHETERIZATION WITH CORONARY ANGIOGRAM;  Surgeon: Burnell Blanks, MD;  Location: [facility] CATH  LAB;  Service: Cardiovascular;  Laterality: N/A;     Home Meds: Prior to Admission medications   Medication Sig Start Date End Date Taking? Authorizing Provider  acetaminophen (TYLENOL) 325 MG tablet Take 2 tablets (650 mg total) by mouth every 6 (six) hours as needed for mild pain or moderate pain. 09/10/13  Yes Isaiah Serge, NP  aspirin EC 81 MG EC tablet Take 1 tablet (81 mg total) by mouth daily. 09/10/13  Yes Isaiah Serge, NP  diazepam (VALIUM) 5 MG tablet Take 2.5-5 mg by mouth at bedtime as needed for anxiety.   Yes Historical Provider, MD  diclofenac sodium (VOLTAREN) 1 % GEL Apply 1 application topically daily as needed (fibromyalgia).   Yes Historical Provider, MD  digoxin (LANOXIN) 0.125 MG tablet Take 1 tablet (0.125 mg total) by mouth daily. 12/03/13  Yes Jolaine Artist, MD  furosemide (LASIX) 40 MG tablet Take 1 tablet (40 mg total) by mouth daily. 12/03/13  Yes Jolaine Artist, MD  HYDROcodone-acetaminophen (NORCO/VICODIN) 5-325 MG per tablet Take 1 tablet by mouth 2 (two) times daily as needed for moderate pain.    Yes Historical Provider, MD  levothyroxine (SYNTHROID, LEVOTHROID) 150 MCG tablet Take 150 mcg by mouth daily before breakfast.   Yes Historical Provider, MD  lisinopril (PRINIVIL,ZESTRIL) 2.5 MG tablet Take 1 tablet (2.5 mg total) by mouth at bedtime. 09/11/13  Yes Isaiah Serge, NP  metoprolol succinate (TOPROL-XL) 25 MG 24 hr tablet Take 0.5 tablets (12.5  mg total) by mouth daily. 09/10/13  Yes Isaiah Serge, NP  polyethylene glycol (MIRALAX / GLYCOLAX) packet Take 17 g by mouth daily as needed for moderate constipation. 09/10/13  Yes Isaiah Serge, NP  sertraline (ZOLOFT) 100 MG tablet Take 100 mg by mouth daily.   Yes Historical Provider, MD  tiZANidine (ZANAFLEX) 4 MG tablet Take 2 mg by mouth 2 (two) times daily as needed for muscle spasms (for fibromyalgia).   Yes Historical Provider, MD       Allergies:  Allergies  Allergen Reactions  . Nucynta  [Tapentadol] Itching  . Percocet [Oxycodone-Acetaminophen] Itching    History   Social History  . Marital Status: Widowed    Spouse Name: N/A    Number of Children: N/A  . Years of Education: N/A   Occupational History  . Not on file.   Social History Main Topics  . Smoking status: Former Smoker -- 1.00 packs/day for 16 years    Types: Cigarettes    Start date: 04/22/1972    Quit date: 09/25/1988  . Smokeless tobacco: Never Used     Comment: Quit around 41  . Alcohol Use: Yes     Comment: occasionally  . Drug Use: No  . Sexual Activity: Not on file   Other Topics Concern  . Not on file   Social History Narrative   Lives in [place] by herself and daughter lives next door. Retired [place]     Family History  Problem Relation Age of Onset  . Hypertension Mother     deceased: adenocarcinoma, HLD  . Cancer Father     deceased: basal cell carcinoma  . Cancer Sister     breast  . Cancer Brother     skin  . Hyperlipidemia Brother      ROS:  Please see the history of present illness.     All other systems reviewed and negative.    Physical Exam: Blood pressure 90/56, pulse 53, height 5' 2.5" (1.588 m), weight 176 lb 9.6 oz (80.105 kg). General: Well developed, well nourished female in no acute distress. Head: Normocephalic, atraumatic, sclera non-icteric, no xanthomas, nares are without discharge. EENT: normal Lymph Nodes:  none Back: without scoliosis/kyphosis, no CVA tendersness Neck: Negative for carotid bruits. JVD not elevated. Lungs: Clear bilaterally to auscultation without wheezes, rales, or rhonchi. Breathing is unlabored. Heart: RRR with S1 S2. 26 systolic murmur , rubs, or gallops appreciated. Abdomen: Soft, non-tender, non-distended with normoactive bowel sounds. No hepatomegaly. No rebound/guarding. No obvious abdominal masses. Msk:  Strength and tone appear normal for age. Extremities: No clubbing or cyanosis. No edema.  Distal pedal pulses are 2+ and  equal bilaterally. Skin: Warm and Dry Neuro: Alert and oriented X 3. CN III-XII intact Grossly normal sensory and motor function . Psych:  Responds to questions appropriately with a normal affect.      Labs: Cardiac Enzymes No results for input(s): CKTOTAL, CKMB, TROPONINI in the last 72 hours. CBC Lab Results  Component Value Date   WBC 7.0 10/22/2013   HGB 13.8 10/22/2013   HCT 41.8 10/22/2013   MCV 89.7 10/22/2013   PLT 199 10/22/2013   PROTIME: No results for input(s): LABPROT, INR in the last 72 hours. Chemistry No results for input(s): NA, K, CL, CO2, BUN, CREATININE, CALCIUM, PROT, BILITOT, ALKPHOS, ALT, AST, GLUCOSE in the last 168 hours.  Invalid input(s): LABALBU Lipids No results found for: CHOL, HDL, LDLCALC, TRIG BNP PRO B NATRIURETIC PEPTIDE (BNP)  Date/Time  Value Ref Range Status  10/22/2013 07:01 PM 2284.0* 0 - 125 pg/mL Final  10/01/2013 10:16 AM 2096.0* 0 - 125 pg/mL Final  09/10/2013 06:45 AM 2408.0* 0 - 125 pg/mL Final  09/05/2013 05:20 PM 8605.0* 0 - 125 pg/mL Final   Miscellaneous No results found for: DDIMER  Radiology/Studies:  No results found.  EKG:  Sinus rhythm with left bundle branch block Intervals 14/17/44 date 1014   Assessment and Plan:  Nonischemic cardiomyopathy  Left bundle branch block  Hypotension  Congestive heart failure-chronic-systolic  The patient has persistent left ventricular dysfunction not withstanding maximally tolerated medications. She has left bundle branch block and is a consequence is a candidate for both defibrillator support as well as cardiac resynchronization.  The hope would also be that with augmented cardiac performance her blood pressure would be sufficient to tolerate augmented medical therapy.  She had questions regarding her prognosis which we discussed.  Have reviewed the potential benefits and risks of ICD implantation including but not limited to death, perforation of heart or lung, lead  dislodgement, infection,  device malfunction and inappropriate shocks.  The patient and family express understanding  and are willing to proceed.       Virl Axe

## 2014-01-13 ENCOUNTER — Encounter (HOSPITAL_COMMUNITY): Payer: Self-pay | Admitting: Cardiology

## 2014-01-13 ENCOUNTER — Ambulatory Visit (HOSPITAL_COMMUNITY): Payer: 59

## 2014-01-13 DIAGNOSIS — I429 Cardiomyopathy, unspecified: Secondary | ICD-10-CM | POA: Diagnosis not present

## 2014-01-13 DIAGNOSIS — Z95810 Presence of automatic (implantable) cardiac defibrillator: Secondary | ICD-10-CM

## 2014-01-13 DIAGNOSIS — I5022 Chronic systolic (congestive) heart failure: Secondary | ICD-10-CM | POA: Diagnosis not present

## 2014-01-13 DIAGNOSIS — I959 Hypotension, unspecified: Secondary | ICD-10-CM | POA: Diagnosis not present

## 2014-01-13 DIAGNOSIS — I447 Left bundle-branch block, unspecified: Secondary | ICD-10-CM | POA: Diagnosis not present

## 2014-01-13 NOTE — Progress Notes (Signed)
   SUBJECTIVE: The patient is doing well today.  She has incisional site soreness.  At this time, she denies chest pain, shortness of breath, or any new concerns.  Marland Kitchen aspirin EC  81 mg Oral Daily  . digoxin  0.125 mg Oral Daily  . furosemide  40 mg Oral Daily  . levothyroxine  150 mcg Oral QAC breakfast  . lisinopril  2.5 mg Oral QHS  . metoprolol succinate  12.5 mg Oral Q breakfast  . sertraline  100 mg Oral Daily      OBJECTIVE: Physical Exam: Filed Vitals:   01/13/14 0705 01/13/14 0710 01/13/14 0731 01/13/14 0808  BP: 92/39 85/37 89/41    Pulse: 60 59 59   Temp:    98.1 F (36.7 C)  TempSrc:    Oral  Resp: 11 14 16    Height:      Weight:      SpO2: 93% 92% 93%     Intake/Output Summary (Last 24 hours) at 01/13/14 0816 Last data filed at 01/13/14 0500  Gross per 24 hour  Intake    840 ml  Output    450 ml  Net    390 ml    Telemetry reveals sinus rhythm with V pacing  GEN- The patient is well appearing, alert and oriented x 3 today.   Head- normocephalic, atraumatic Eyes-  Sclera clear, conjunctiva pink Ears- hearing intact Oropharynx- clear Neck- supple, no JVP Lymph- no cervical lymphadenopathy Lungs- Clear to ausculation bilaterally, normal work of breathing Heart- Regular rate and rhythm, no murmurs, rubs or gallops, PMI not laterally displaced GI- soft, NT, ND, + BS Extremities- no clubbing, cyanosis, or edema Skin- ICD pocket is without hematoma  RADIOLOGY: Dg Chest 2 View  01/13/2014   CLINICAL DATA:  Post pacemaker implantation  EXAM: CHEST  2 VIEW  COMPARISON:  10/22/2013; 09/05/2013; chest CT - 09/05/2013  FINDINGS: Grossly unchanged cardiac silhouette and mediastinal contours. Interval placement of left anterior chest wall dual lead AICD/pacemaker with lead tips overlying the expected location of the right atrium, ventricle and coronary sinus. No pneumothorax. No focal airspace opacities. No pleural effusion or pneumothorax. No evidence of edema. No  acute osseus abnormalities. A gastric band device overlies expected location of the gastric fundus.  IMPRESSION: Post left anterior chest wall 3 lead AICD / pacemaker implantation without evidence of complication.   Electronically Signed   By: Sandi Mariscal M.D.   On: 01/13/2014 05:57    ASSESSMENT AND PLAN:  Active Problems:   Nonischemic cardiomyopathy CXR reveals stable leads, no ptx ICD interrogation is reviewed and normal this am  DC to home Resume home medicines Routine wound care and follow-up with Dr Thomas Hoff, MD 01/13/2014 8:16 AM

## 2014-01-13 NOTE — Discharge Summary (Signed)
Physician Discharge Summary  Patient ID: [name] MRN: [id number] DOB/AGE: [birth year] 59 y.o.  Admit date: 01/12/2014 Discharge date: 01/13/2014  Admission Diagnoses: Nonischemic Cardiomyopathy  Discharge Diagnoses:  Active Problems:   Nonischemic cardiomyopathy   ICD (implantable cardioverter-defibrillator), dual, in situ - St. Jude 01/2014   Discharged Condition: stable  Hospital Course: The patient is a 59 y/o female admitted to [facility] 01/12/13 to undergo planned ICD implantation for primary prevention in the setting of nonischemic cardiomyopathy w/ EF <35% and LBBB.  She was originally diagnosed 8/15 when she presented with newly identified systolic heart failure. Catheterization demonstrated no obstructive coronary disease and normal filling pressures. 2D Echocardiogram demonstrated EF of 15-20%. She was placed on guideline directed medical therapy with an ACE and BB however this was limited by hypotension. 3 month repeat 2D echo demonstrated little improvement in systolic function with EF at 20-25%. She was referred to Dr. Caryl Comes who recommended an ICD.   On 01/12/14 she underwent successful insertion of a St Jude double chamber ICD. She tolerated the procedure well and left the OR in stable condition. Post-operative CXR demonstrated proper lead placement w/o evidence for pneumothorax. Device interrogation revealed normal functioning. Wound site remained stable. She had no post op complications. She was last seen and examined by Dr. Rayann Heman who determined she was stable for discharge home.   Consults: None  Significant Diagnostic Studies:  Post-operative CXR 01/13/14 EXAM: CHEST 2 VIEW  COMPARISON: 10/22/2013; 09/05/2013; chest CT - 09/05/2013  FINDINGS: Grossly unchanged cardiac silhouette and mediastinal contours. Interval placement of left anterior chest wall dual lead AICD/pacemaker with lead tips overlying the expected location of the right atrium, ventricle and coronary sinus.  No pneumothorax. No focal airspace opacities. No pleural effusion or pneumothorax. No evidence of edema. No acute osseus abnormalities. A gastric band device overlies expected location of the gastric fundus.  IMPRESSION: Post left anterior chest wall 3 lead AICD / pacemaker implantation without evidence of complication.   Treatments: See Full OP Report from 01/12/14  Discharge Exam: Blood pressure 95/44, pulse 64, temperature 98.1 F (36.7 C), temperature source Oral, resp. rate 20, height 5\' 3"  (1.6 m), weight 179 lb 11.2 oz (81.511 kg), SpO2 94 %.   Disposition: 01-Home or Self Care      Discharge Instructions    Diet - low sodium heart healthy    Complete by:  As directed      Increase activity slowly    Complete by:  As directed             Medication List    TAKE these medications        acetaminophen 325 MG tablet  Commonly known as:  TYLENOL  Take 2 tablets (650 mg total) by mouth every 6 (six) hours as needed for mild pain or moderate pain.     aspirin 81 MG EC tablet  Take 1 tablet (81 mg total) by mouth daily.     diazepam 5 MG tablet  Commonly known as:  VALIUM  Take 2.5-5 mg by mouth at bedtime as needed for anxiety.     diclofenac sodium 1 % Gel  Commonly known as:  VOLTAREN  Apply 1 application topically daily as needed (fibromyalgia).     digoxin 0.125 MG tablet  Commonly known as:  LANOXIN  Take 1 tablet (0.125 mg total) by mouth daily.     furosemide 40 MG tablet  Commonly known as:  LASIX  Take 1 tablet (40 mg total) by  mouth daily.     HYDROcodone-acetaminophen 5-325 MG per tablet  Commonly known as:  NORCO/VICODIN  Take 1 tablet by mouth 2 (two) times daily as needed for moderate pain.     levothyroxine 150 MCG tablet  Commonly known as:  SYNTHROID, LEVOTHROID  Take 150 mcg by mouth daily before breakfast.     lisinopril 2.5 MG tablet  Commonly known as:  PRINIVIL,ZESTRIL  Take 1 tablet (2.5 mg total) by mouth at bedtime.      metoprolol succinate 25 MG 24 hr tablet  Commonly known as:  TOPROL-XL  Take 0.5 tablets (12.5 mg total) by mouth daily.     polyethylene glycol packet  Commonly known as:  MIRALAX / GLYCOLAX  Take 17 g by mouth daily as needed for moderate constipation.     sertraline 100 MG tablet  Commonly known as:  ZOLOFT  Take 100 mg by mouth daily.     tiZANidine 4 MG tablet  Commonly known as:  ZANAFLEX  Take 2 mg by mouth 2 (two) times daily as needed for muscle spasms (for fibromyalgia).       Follow-up Information    Follow up with [facility] On 01/22/2014.   Why:  For wound re-check, For suture removal   Contact information:   [place] [address] [phone]      Follow up with Virl Axe, MD.   Specialty:  Cardiology   Why:  our office will call you with a follow-up appointment   Contact information:   [address] [place] [address] [phone]      TIME SPENT ON DISCHARGE, INCLUDING PHYSICIAN TIME: >30 MINUTES  Signed: [name] 01/13/2014, 11:10 AM  Thompson Grayer MD

## 2014-01-13 NOTE — Discharge Instructions (Signed)
Supplemental Discharge Instructions for  Pacemaker/Defibrillator Patients  Activity No heavy lifting or vigorous activity with your left/right arm for 6 to 8 weeks.  Do not raise your left/right arm above your head for one week.  Gradually raise your affected arm as drawn below.                      01/14/14                 01/16/14              01/18/14                  01/20/14           NO DRIVING for 7 days   ; you may begin driving on    06/04/76  . WOUND CARE - Keep the wound area clean and dry.  Do not get this area wet for one week. No showers for one week; you may shower on      01/20/14       . - The tape/steri-strips on your wound will fall off; do not pull them off.  No bandage is needed on the site.  DO  NOT apply any creams, oils, or ointments to the wound area. - If you notice any drainage or discharge from the wound, any swelling or bruising at the site, or you develop a fever > 101? F after you are discharged home, call the office at once.  Special Instructions - You are still able to use cellular telephones; use the ear opposite the side where you have your pacemaker/defibrillator.  Avoid carrying your cellular phone near your device. - When traveling through airports, show security personnel your identification card to avoid being screened in the metal detectors.  Ask the security personnel to use the hand wand. - Avoid arc welding equipment, MRI testing (magnetic resonance imaging), TENS units (transcutaneous nerve stimulators).  Call the office for questions about other devices. - Avoid electrical appliances that are in poor condition or are not properly grounded. - Microwave ovens are safe to be near or to operate.  Additional information for defibrillator patients should your device go off: - If your device goes off ONCE and you feel fine afterward, notify the device clinic nurses. - If your device goes off ONCE and you do not feel well afterward, call 911. - If your device  goes off TWICE, call 911. - If your device goes off THREE times in one day, call 911.  DO NOT DRIVE YOURSELF OR A FAMILY MEMBER WITH A DEFIBRILLATOR TO THE HOSPITAL--CALL 911.

## 2014-01-13 NOTE — Op Note (Signed)
[name], [name] NO.:  [id number]  MEDICAL RECORD NO.:  [id number]  LOCATION:  3W20C                        FACILITY:  [facility]  PHYSICIAN:  Deboraha Sprang, MD, FACCDATE OF BIRTH:  1955/06/05  DATE OF PROCEDURE:  01/12/2014 DATE OF DISCHARGE:                              OPERATIVE REPORT   PREOPERATIVE DIAGNOSES:  Nonischemic cardiomyopathy, congestive heart failure, and left bundle-branch block.  POSTOPERATIVE DIAGNOSES:  Nonischemic cardiomyopathy, congestive heart failure, and left bundle-branch block.  PROCEDURE:  Dual-chamber defibrillator implantation with left ventricular lead placement.  Following obtaining informed consent, the patient was brought to electrophysiology laboratory and placed on the fluoroscopic table in supine position.  After routine prep and drape of the left upper chest, lidocaine was infiltrated in the prepectoral subclavicular region. Incision was made and carried down to layer of the prepectoral fascia using electrocautery sharp dissection.  A pocket was formed similarly and hemostasis was obtained.  Thereafter, attention was turned to gain access to the extrathoracic left subclavian vein which was accomplished without difficulty and without the aspiration or puncture of the artery.  Three separate venipunctures were accomplished.  Guidewires were placed and retained. Sequentially, 8-French, 9.5-French, and 7-French sheaths were placed through which were passed a St. Jude 7122 single coil active fixation defibrillator lead, serial number [id number] A St. Jude 135 coronary sinus cannulation catheter and a St. Jude 1680ATC 52-cm active fixation atrial lead, serial #[id number].  Under fluoroscopic guidance, the RV lead was manipulated to the apex with bipolar R-wave was 8.7, the pace impedance of 735, a threshold 0.4 V at 0.5 milliseconds.  Current threshold was 0.5 mA.  There is no diaphragmatic pacing at 10 V.  The current of  injury was brisk.  This lead was secured to the prepectoral fascia.  We then obtained access to the coronary sinus without difficulty and a nonocclusive venogram demonstrated a high lateral branch that corresponded to mid lateral surface which we targeted.  We were able to deploy a St. Jude 1450 Q lead, serial [id number] to the junction between the mid and distal 3rd where the bipolar L-wave was 32.2 in a 1:2 configuration with impedance of 1126, a threshold 1.7 V at 0.5 milliseconds.  The 9.5-French sheath was removed.  The CS sheath was left in place and the atrial lead was deployed to the right atrial appendage where the bipolar P-wave was 1.8 with a pace impedance of 671, a threshold shortly after screw deployment of 1.9 V at 0.5 milliseconds. Current of injury was 3 mA and there is no diaphragmatic pacing at 10 V. The current of injury was brisk.  This lead was secured to the prepectoral fascia.  We then noted that there have been posterior dislocation of the LV lead and we were able thankfully to redeployed as the sheath had actually come out from the coronary sinus.  We were able to deploy to a similar location and the sheath was removed and the stability was confirmed fluoroscopically.  The leads were then attached to a [place] high-voltage defibrillator, serial [id number].  Through the device, the bipolar P-wave was 3.4, the pace impedance of 540, threshold 0.5 V at 0.5 milliseconds.  The RV amplitude was 11.7, the pace impedance of 610, threshold 0.5 at 0.5 and the LV impedance in the 3-2 configuration was 700, the pacing threshold 1.25 at 0.5.  I noted diaphragmatic stimulation with the distal tip pacing.  High-voltage impedance was 98 ohms.  Significant shortening of the QRS was noted with Bi-V pacing.  The pocket was copiously irrigated with antibiotic containing saline solution.  Surgicel was placed at the cephalad and caudal aspects of the pocket.  The device was  placed in the pocket following irrigation with antibiotic containing saline solution and it was secured to the prepectoral fascia.  The wound was then closed in 2 layers in normal fashion.  The wound was washed and dried and benzoin and Steri-Strip dressings were applied.  Needle counts, sponge counts, instrument counts were correct at the end of the procedure according to the staff.  The patient tolerated the procedure without apparent complication.  ESTIMATED BLOOD LOSS:  Minimal.     Deboraha Sprang, MD, [facility]     SCK/MEDQ  D:  01/12/2014  T:  01/13/2014  Job:  [id number]

## 2014-01-17 ENCOUNTER — Emergency Department (HOSPITAL_COMMUNITY): Payer: 59

## 2014-01-17 ENCOUNTER — Encounter (HOSPITAL_COMMUNITY): Payer: Self-pay | Admitting: Emergency Medicine

## 2014-01-17 ENCOUNTER — Observation Stay (HOSPITAL_COMMUNITY)
Admission: EM | Admit: 2014-01-17 | Discharge: 2014-01-19 | Disposition: A | Discharge status: Home / Self Care | Payer: 59 | Attending: Internal Medicine | Admitting: Internal Medicine

## 2014-01-17 DIAGNOSIS — G4733 Obstructive sleep apnea (adult) (pediatric): Secondary | ICD-10-CM | POA: Diagnosis not present

## 2014-01-17 DIAGNOSIS — R42 Dizziness and giddiness: Secondary | ICD-10-CM | POA: Insufficient documentation

## 2014-01-17 DIAGNOSIS — F419 Anxiety disorder, unspecified: Secondary | ICD-10-CM | POA: Insufficient documentation

## 2014-01-17 DIAGNOSIS — R079 Chest pain, unspecified: Secondary | ICD-10-CM | POA: Diagnosis not present

## 2014-01-17 DIAGNOSIS — Z87891 Personal history of nicotine dependence: Secondary | ICD-10-CM | POA: Insufficient documentation

## 2014-01-17 DIAGNOSIS — E039 Hypothyroidism, unspecified: Secondary | ICD-10-CM | POA: Diagnosis present

## 2014-01-17 DIAGNOSIS — F329 Major depressive disorder, single episode, unspecified: Secondary | ICD-10-CM | POA: Insufficient documentation

## 2014-01-17 DIAGNOSIS — J45909 Unspecified asthma, uncomplicated: Secondary | ICD-10-CM | POA: Diagnosis not present

## 2014-01-17 DIAGNOSIS — I428 Other cardiomyopathies: Secondary | ICD-10-CM

## 2014-01-17 DIAGNOSIS — I5022 Chronic systolic (congestive) heart failure: Secondary | ICD-10-CM | POA: Diagnosis not present

## 2014-01-17 DIAGNOSIS — Z9989 Dependence on other enabling machines and devices: Secondary | ICD-10-CM | POA: Diagnosis not present

## 2014-01-17 DIAGNOSIS — I429 Cardiomyopathy, unspecified: Secondary | ICD-10-CM | POA: Insufficient documentation

## 2014-01-17 DIAGNOSIS — I447 Left bundle-branch block, unspecified: Secondary | ICD-10-CM | POA: Insufficient documentation

## 2014-01-17 DIAGNOSIS — Z6831 Body mass index (BMI) 31.0-31.9, adult: Secondary | ICD-10-CM | POA: Insufficient documentation

## 2014-01-17 DIAGNOSIS — Z95810 Presence of automatic (implantable) cardiac defibrillator: Secondary | ICD-10-CM | POA: Insufficient documentation

## 2014-01-17 DIAGNOSIS — R072 Precordial pain: Secondary | ICD-10-CM

## 2014-01-17 DIAGNOSIS — Z7982 Long term (current) use of aspirin: Secondary | ICD-10-CM | POA: Insufficient documentation

## 2014-01-17 DIAGNOSIS — M797 Fibromyalgia: Secondary | ICD-10-CM | POA: Insufficient documentation

## 2014-01-17 DIAGNOSIS — R7989 Other specified abnormal findings of blood chemistry: Secondary | ICD-10-CM

## 2014-01-17 DIAGNOSIS — E669 Obesity, unspecified: Secondary | ICD-10-CM | POA: Diagnosis not present

## 2014-01-17 DIAGNOSIS — Z9889 Other specified postprocedural states: Secondary | ICD-10-CM

## 2014-01-17 LAB — COMPREHENSIVE METABOLIC PANEL
ALBUMIN: 3.6 g/dL (ref 3.5–5.2)
ALK PHOS: 76 U/L (ref 39–117)
ALT: 22 U/L (ref 0–35)
ANION GAP: 8 (ref 5–15)
AST: 35 U/L (ref 0–37)
BUN: 24 mg/dL — AB (ref 6–23)
CHLORIDE: 102 meq/L (ref 96–112)
CO2: 28 mmol/L (ref 19–32)
CREATININE: 1.19 mg/dL — AB (ref 0.50–1.10)
Calcium: 10.1 mg/dL (ref 8.4–10.5)
GFR, EST AFRICAN AMERICAN: 58 mL/min — AB (ref >90)
GFR, EST NON AFRICAN AMERICAN: 50 mL/min — AB (ref >90)
Glucose, Bld: 109 mg/dL — ABNORMAL HIGH (ref 70–99)
POTASSIUM: 4.2 mmol/L (ref 3.5–5.1)
SODIUM: 138 mmol/L (ref 135–145)
Total Bilirubin: 0.4 mg/dL (ref 0.3–1.2)
Total Protein: 6.7 g/dL (ref 6.0–8.3)

## 2014-01-17 LAB — URINALYSIS, ROUTINE W REFLEX MICROSCOPIC
BILIRUBIN URINE: NEGATIVE
Glucose, UA: NEGATIVE mg/dL
Hgb urine dipstick: NEGATIVE
Ketones, ur: NEGATIVE mg/dL
Leukocytes, UA: NEGATIVE
NITRITE: NEGATIVE
Protein, ur: NEGATIVE mg/dL
Specific Gravity, Urine: 1.02 (ref 1.005–1.030)
Urobilinogen, UA: 0.2 mg/dL (ref 0.0–1.0)
pH: 5 (ref 5.0–8.0)

## 2014-01-17 LAB — I-STAT TROPONIN, ED: Troponin i, poc: 0.18 ng/mL (ref 0.00–0.08)

## 2014-01-17 LAB — CBC
HCT: 37.3 % (ref 36.0–46.0)
Hemoglobin: 12.4 g/dL (ref 12.0–15.0)
MCH: 30 pg (ref 26.0–34.0)
MCHC: 33.2 g/dL (ref 30.0–36.0)
MCV: 90.3 fL (ref 78.0–100.0)
PLATELETS: 160 10*3/uL (ref 150–400)
RBC: 4.13 MIL/uL (ref 3.87–5.11)
RDW: 13 % (ref 11.5–15.5)
WBC: 8.6 10*3/uL (ref 4.0–10.5)

## 2014-01-17 LAB — TROPONIN I
Troponin I: 0.3 ng/mL — ABNORMAL HIGH (ref <0.031)
Troponin I: 0.28 ng/mL — ABNORMAL HIGH (ref <0.031)

## 2014-01-17 LAB — PROTIME-INR
INR: 1.01 (ref 0.00–1.49)
PROTHROMBIN TIME: 13.4 s (ref 11.6–15.2)

## 2014-01-17 LAB — BRAIN NATRIURETIC PEPTIDE: B Natriuretic Peptide: 70.3 pg/mL (ref 0.0–100.0)

## 2014-01-17 MED ORDER — LEVOTHYROXINE SODIUM 150 MCG PO TABS
150.0000 ug | ORAL_TABLET | Freq: Every day | ORAL | Status: DC
  Administered 2014-01-18 – 2014-01-19 (×2): 150 ug via ORAL
  Filled 2014-01-17 (×3): qty 1

## 2014-01-17 MED ORDER — ASPIRIN 81 MG PO CHEW: 324.0000 mg | CHEWABLE_TABLET | ORAL | Status: AC

## 2014-01-17 MED ORDER — ACETAMINOPHEN 325 MG PO TABS: 650.0000 mg | ORAL_TABLET | ORAL | Status: DC | PRN

## 2014-01-17 MED ORDER — ASPIRIN EC 81 MG PO TBEC: 81.0000 mg | DELAYED_RELEASE_TABLET | Freq: Every day | ORAL | Status: DC

## 2014-01-17 MED ORDER — ASPIRIN EC 81 MG PO TBEC
81.0000 mg | DELAYED_RELEASE_TABLET | Freq: Every day | ORAL | Status: DC
  Administered 2014-01-18 – 2014-01-19 (×2): 81 mg via ORAL
  Filled 2014-01-17 (×2): qty 1

## 2014-01-17 MED ORDER — ONDANSETRON HCL 4 MG/2ML IJ SOLN: 4.0000 mg | Freq: Four times a day (QID) | INTRAMUSCULAR | Status: DC | PRN

## 2014-01-17 MED ORDER — ASPIRIN 300 MG RE SUPP
300.0000 mg | RECTAL | Status: AC
  Filled 2014-01-17: qty 1

## 2014-01-17 MED ORDER — DIAZEPAM 5 MG PO TABS
2.5000 mg | ORAL_TABLET | Freq: Every evening | ORAL | Status: DC | PRN
  Filled 2014-01-17: qty 1

## 2014-01-17 MED ORDER — HEPARIN SODIUM (PORCINE) 5000 UNIT/ML IJ SOLN
5000.0000 [IU] | Freq: Three times a day (TID) | INTRAMUSCULAR | Status: DC
  Administered 2014-01-17 – 2014-01-19 (×5): 5000 [IU] via SUBCUTANEOUS
  Filled 2014-01-17 (×7): qty 1

## 2014-01-17 MED ORDER — SERTRALINE HCL 100 MG PO TABS
100.0000 mg | ORAL_TABLET | Freq: Every day | ORAL | Status: DC
  Administered 2014-01-18 – 2014-01-19 (×2): 100 mg via ORAL
  Filled 2014-01-17 (×2): qty 1

## 2014-01-17 MED ORDER — HYDROCODONE-ACETAMINOPHEN 5-325 MG PO TABS
1.0000 | ORAL_TABLET | Freq: Two times a day (BID) | ORAL | Status: DC | PRN
  Administered 2014-01-17 – 2014-01-19 (×4): 1 via ORAL
  Filled 2014-01-17 (×4): qty 1

## 2014-01-17 MED ORDER — NITROGLYCERIN 0.4 MG SL SUBL: 0.4000 mg | SUBLINGUAL_TABLET | SUBLINGUAL | Status: DC | PRN

## 2014-01-17 MED ORDER — SODIUM CHLORIDE 0.9 % IJ SOLN
3.0000 mL | Freq: Two times a day (BID) | INTRAMUSCULAR | Status: DC
  Administered 2014-01-17: 3 mL via INTRAVENOUS

## 2014-01-17 MED ORDER — SODIUM CHLORIDE 0.9 % IV SOLN: 250.0000 mL | INTRAVENOUS | Status: DC | PRN

## 2014-01-17 MED ORDER — METOPROLOL SUCCINATE 12.5 MG HALF TABLET
12.5000 mg | ORAL_TABLET | Freq: Every day | ORAL | Status: DC
  Administered 2014-01-18 – 2014-01-19 (×2): 12.5 mg via ORAL
  Filled 2014-01-17 (×2): qty 1

## 2014-01-17 MED ORDER — TIZANIDINE HCL 2 MG PO TABS
2.0000 mg | ORAL_TABLET | Freq: Two times a day (BID) | ORAL | Status: DC | PRN
  Administered 2014-01-17 – 2014-01-18 (×3): 2 mg via ORAL
  Filled 2014-01-17 (×6): qty 1

## 2014-01-17 MED ORDER — DIGOXIN 125 MCG PO TABS
0.1250 mg | ORAL_TABLET | Freq: Every day | ORAL | Status: DC
  Administered 2014-01-18 – 2014-01-19 (×2): 0.125 mg via ORAL
  Filled 2014-01-17 (×2): qty 1

## 2014-01-17 MED ORDER — SODIUM CHLORIDE 0.9 % IJ SOLN: 3.0000 mL | INTRAMUSCULAR | Status: DC | PRN

## 2014-01-17 NOTE — ED Provider Notes (Signed)
CSN: [id number]     Arrival date & time 01/17/14  29 History   First MD Initiated Contact with Patient 01/17/14 1305     Chief Complaint  Patient presents with  . Chest Pain     (Consider location/radiation/quality/duration/timing/severity/associated sxs/prior Treatment) Patient is a 59 y.o. female presenting with chest pain.  Chest Pain Pain location:  Substernal area Pain quality: pressure   Pain radiates to:  Does not radiate Pain radiates to the back: no   Pain severity:  Moderate Onset quality:  Gradual Duration:  1 day Timing:  Constant Progression:  Worsening Chronicity:  Recurrent Context: at rest   Worsened by:  Nothing tried Ineffective treatments:  None tried Associated symptoms: no abdominal pain, no fever, no nausea, no shortness of breath and not vomiting     Past Medical History  Diagnosis Date  . Asthma   . Bronchitis   . Hypothyroidism   . Fibromyalgia   . Depression   . Anxiety   . Chronic systolic HF (heart failure)     a. NICM b. RHC (09/08/13): RA: 2, RV 26/0/2, PA: 25/9 (15), PCWP: 9, Fick CO/CI: 4.6 / 2.5, PA 68% c. ECHO (08/2013) EF 20-25%, grade II DD, RV sys fx mildly reduced, mod TR  . At risk for sudden cardiac death, life vest    . NICM (nonischemic cardiomyopathy)     a. LHC (08/2013): no angiographic evidence of CAD  . Left bundle branch block   . ICD (implantable cardioverter-defibrillator), dual, in situ 01/12/14    St. Jude ; for primary prevention for EF<35%   Past Surgical History  Procedure Laterality Date  . Abdominal hysterectomy    . Shoulder open rotator cuff repair Left   . Laparoscopic gastric banding    . Left and right heart catheterization with coronary angiogram N/A 09/08/2013    Procedure: LEFT AND RIGHT HEART CATHETERIZATION WITH CORONARY ANGIOGRAM;  Surgeon: Burnell Blanks, MD;  Location: [facility] CATH LAB;  Service: Cardiovascular;  Laterality: N/A;  . Bi-ventricular implantable cardioverter defibrillator N/A 01/12/2014     Procedure: BI-VENTRICULAR IMPLANTABLE CARDIOVERTER DEFIBRILLATOR  (CRT-D);  Surgeon: Deboraha Sprang, MD;  Location: [facility] CATH LAB;  Service: Cardiovascular;  Laterality: N/A;   Family History  Problem Relation Age of Onset  . Hypertension Mother     deceased: adenocarcinoma, HLD  . Cancer Father     deceased: basal cell carcinoma  . Cancer Sister     breast  . Cancer Brother     skin  . Hyperlipidemia Brother    History  Substance Use Topics  . Smoking status: Former Smoker -- 1.00 packs/day for 16 years    Types: Cigarettes    Start date: 04/22/1972    Quit date: 09/25/1988  . Smokeless tobacco: Never Used     Comment: Quit around 49  . Alcohol Use: Yes     Comment: occasionally   OB History    No data available     Review of Systems  Constitutional: Negative for fever.  Respiratory: Negative for shortness of breath.   Cardiovascular: Positive for chest pain.  Gastrointestinal: Negative for nausea, vomiting and abdominal pain.  All other systems reviewed and are negative.     Allergies  Nucynta and Percocet  Home Medications   Prior to Admission medications   Medication Sig Start Date End Date Taking? Authorizing Provider  acetaminophen (TYLENOL) 325 MG tablet Take 2 tablets (650 mg total) by mouth every 6 (six) hours as needed  for mild pain or moderate pain. 09/10/13  Yes Isaiah Serge, NP  aspirin EC 81 MG EC tablet Take 1 tablet (81 mg total) by mouth daily. 09/10/13  Yes Isaiah Serge, NP  diazepam (VALIUM) 5 MG tablet Take 2.5-5 mg by mouth at bedtime as needed for anxiety.   Yes Historical Provider, MD  diclofenac sodium (VOLTAREN) 1 % GEL Apply 1 application topically daily as needed (fibromyalgia).   Yes Historical Provider, MD  digoxin (LANOXIN) 0.125 MG tablet Take 1 tablet (0.125 mg total) by mouth daily. 12/03/13  Yes Jolaine Artist, MD  furosemide (LASIX) 40 MG tablet Take 1 tablet (40 mg total) by mouth daily. 12/03/13  Yes Jolaine Artist,  MD  HYDROcodone-acetaminophen (NORCO/VICODIN) 5-325 MG per tablet Take 1 tablet by mouth 2 (two) times daily as needed for moderate pain.    Yes Historical Provider, MD  levothyroxine (SYNTHROID, LEVOTHROID) 150 MCG tablet Take 150 mcg by mouth daily before breakfast.   Yes Historical Provider, MD  lisinopril (PRINIVIL,ZESTRIL) 2.5 MG tablet Take 1 tablet (2.5 mg total) by mouth at bedtime. 09/11/13  Yes Isaiah Serge, NP  metoprolol succinate (TOPROL-XL) 25 MG 24 hr tablet Take 0.5 tablets (12.5 mg total) by mouth daily. 09/10/13  Yes Isaiah Serge, NP  polyethylene glycol (MIRALAX / GLYCOLAX) packet Take 17 g by mouth daily as needed for moderate constipation. 09/10/13  Yes Isaiah Serge, NP  sertraline (ZOLOFT) 100 MG tablet Take 100 mg by mouth daily.   Yes Historical Provider, MD  tiZANidine (ZANAFLEX) 4 MG tablet Take 2 mg by mouth 2 (two) times daily.    Yes Historical Provider, MD   BP 119/61 mmHg  Pulse 65  Temp(Src) 98.2 F (36.8 C) (Oral)  Resp 16  Ht 5\' 3"  (1.6 m)  Wt 177 lb 8 oz (80.513 kg)  BMI 31.45 kg/m2  SpO2 94% Physical Exam  Constitutional: She is oriented to person, place, and time. She appears well-developed and well-nourished.  HENT:  Head: Normocephalic and atraumatic.  Right Ear: External ear normal.  Left Ear: External ear normal.  Eyes: Conjunctivae and EOM are normal. Pupils are equal, round, and reactive to light.  Neck: Normal range of motion. Neck supple.  Cardiovascular: Normal rate, regular rhythm, normal heart sounds and intact distal pulses.   Pulmonary/Chest: Effort normal and breath sounds normal.  Abdominal: Soft. Bowel sounds are normal. There is no tenderness.  Musculoskeletal: Normal range of motion.  Neurological: She is alert and oriented to person, place, and time.  Skin: Skin is warm and dry.  Vitals reviewed.   ED Course  Procedures (including critical care time) Labs Review Labs Reviewed  COMPREHENSIVE METABOLIC PANEL - Abnormal;  Notable for the following:    Glucose, Bld 109 (*)    BUN 24 (*)    Creatinine, Ser 1.19 (*)    GFR calc non Af Amer 50 (*)    GFR calc Af Amer 58 (*)    All other components within normal limits  TROPONIN I - Abnormal; Notable for the following:    Troponin I 0.30 (*)    All other components within normal limits  COMPREHENSIVE METABOLIC PANEL - Abnormal; Notable for the following:    BUN 24 (*)    GFR calc non Af Amer 66 (*)    GFR calc Af Amer 76 (*)    All other components within normal limits  LIPID PANEL - Abnormal; Notable for the following:  Cholesterol 226 (*)    Triglycerides 202 (*)    LDL Cholesterol 124 (*)    All other components within normal limits  TROPONIN I - Abnormal; Notable for the following:    Troponin I 0.28 (*)    All other components within normal limits  TROPONIN I - Abnormal; Notable for the following:    Troponin I 0.28 (*)    All other components within normal limits  TROPONIN I - Abnormal; Notable for the following:    Troponin I 0.24 (*)    All other components within normal limits  I-STAT TROPOININ, ED - Abnormal; Notable for the following:    Troponin i, poc 0.18 (*)    All other components within normal limits  CBC  BRAIN NATRIURETIC PEPTIDE  URINALYSIS, ROUTINE W REFLEX MICROSCOPIC  PROTIME-INR  CBC  PROTIME-INR    Imaging Review Dg Chest Portable 1 View  01/17/2014   CLINICAL DATA:  Chest pain  EXAM: PORTABLE CHEST - 1 VIEW  COMPARISON:  01/13/2014  FINDINGS: Low lung volumes. No focal consolidation. No pleural effusion or pneumothorax.  Cardiomegaly.  Left subclavian ICD.  IMPRESSION: No evidence of acute cardiopulmonary disease.   Electronically Signed   By: Julian Hy M.D.   On: 01/17/2014 13:50     EKG Interpretation   Date/Time:  Saturday January 17 2014 12:57:18 EST Ventricular Rate:  61 PR Interval:  151 QRS Duration: 123 QT Interval:  429 QTC Calculation: 432 R Axis:   12 Text Interpretation:   Atrial-ventricular dual-paced rhythm No further  analysis attempted due to paced rhythm Confirmed by Debby Freiberg  [phone]) on 01/17/2014 1:24:25 PM      CRITICAL CARE Performed by: Debby Freiberg   Total critical care time: 32 min  Critical care time was exclusive of separately billable procedures and treating other patients.  Critical care was necessary to treat or prevent imminent or life-threatening deterioration.  Critical care was time spent personally by me on the following activities: development of treatment plan with patient and/or surrogate as well as nursing, discussions with consultants, evaluation of patient's response to treatment, examination of patient, obtaining history from patient or surrogate, ordering and performing treatments and interventions, ordering and review of laboratory studies, ordering and review of radiographic studies, pulse oximetry and re-evaluation of patient's condition.  MDM   Final diagnoses:  None    59 y.o. female with pertinent PMH of NICM, sCHF recent pacer placement presents with chest pain.  Symptoms typical for acs, however began with pain at pacer site.  ECG obtained by EMS with concern for STEMI, this was called to carelink, but this was not relayed here.  ASA given PTA with relief of symptoms.  I immediately obtained an ECG here which was without STE given paced rhythm and not meeting sgarbossa criteria.  Troponin returned elevated mildly, so I immediately consulted cardiology.  Cardiology admitted pt.    I have reviewed all laboratory and imaging studies if ordered as above  NSTEMI      Debby Freiberg, MD 01/18/14 [phone]

## 2014-01-17 NOTE — H&P (Signed)
Patient ID: [name] MRN: [id number], DOB/AGE: Mar 25, 1955   Admit date: 01/17/2014   Primary Physician: Glenda Chroman., MD Primary Cardiologist: Dr. Haroldine Laws Dr. Caryl Comes  Pt. Profile:  [name] is a 59 y.o. female with a history of non-ischemic CM (EF 20-25%) s/p recent ICD/PPM, obesity, depression/anxiety, LBBB, hypothyroidism who presented today with chest pain, dizziness and pain/swelling at device surgical site.   She was initially admitted for SOB 8/28-09/11/13 and found to have newly diagnosed systolic HF. Underwent R/LHC which showed normal filling pressures and nl coronaries. EF 15-20% on ECHO (08/2013). This improved to 20-25% 12/03/13. She wore a lifevest until ICD placement could be arranged. She had ICD/PM placed on 01/12/14 by Dr Caryl Comes. Started feeling dizzy and would see spots starting Wednesday. This was mostly with standing up from sitting and moving around. She would feel better sitting down. She also noted swelling, ecchymosis and pain around her device site. No fevers or chills. It feels tense and is very tender to palpation even down into her axilla.  Today she started having CP. The chest pain is described as a twinging and throbbing in her left substernal chest pain and under her left breast that is totally unrelated to her device procedure pain. The chest pain was not associated with SOB or diaphoresis. It radiated to her left arm and was associated some mild nausea. It started while bending over in the chair to grab some tax files. It was constant and lasted about 30 minutes. Still has some residual tenderness under her left breast. She took her BP and it was 70/40 this AM. Her sister in law called EMS. She denies LE edema, orthopnea, PND or SOB.   Problem List  Past Medical History  Diagnosis Date  . Asthma   . Bronchitis   . PNA (pneumonia)   . Hypothyroidism   . Fibromyalgia   . Depression   . Anxiety   . Chronic systolic HF (heart failure)     a.  NICM b. RHC (09/08/13): RA: 2, RV 26/0/2, PA: 25/9 (15), PCWP: 9, Fick CO/CI: 4.6 / 2.5, PA 68% c. ECHO (08/2013) EF 20-25%, grade II DD, RV sys fx mildly reduced, mod TR  . At risk for sudden cardiac death, life vest    . NICM (nonischemic cardiomyopathy)     a. LHC (08/2013): no angiographic evidence of CAD  . Left bundle branch block   . ICD (implantable cardioverter-defibrillator), dual, in situ 01/12/14    St. Jude ; for primary prevention for EF<35%    Past Surgical History  Procedure Laterality Date  . Abdominal hysterectomy    . Shoulder open rotator cuff repair Left   . Laparoscopic gastric banding    . Left and right heart catheterization with coronary angiogram N/A 09/08/2013    Procedure: LEFT AND RIGHT HEART CATHETERIZATION WITH CORONARY ANGIOGRAM;  Surgeon: Burnell Blanks, MD;  Location: [facility] CATH LAB;  Service: Cardiovascular;  Laterality: N/A;  . Bi-ventricular implantable cardioverter defibrillator N/A 01/12/2014    Procedure: BI-VENTRICULAR IMPLANTABLE CARDIOVERTER DEFIBRILLATOR  (CRT-D);  Surgeon: Deboraha Sprang, MD;  Location: [facility] CATH LAB;  Service: Cardiovascular;  Laterality: N/A;     Allergies  Allergies  Allergen Reactions  . Nucynta [Tapentadol] Itching  . Percocet [Oxycodone-Acetaminophen] Itching     Home Medications  Prior to Admission medications   Medication Sig Start Date End Date Taking? Authorizing Provider  acetaminophen (TYLENOL) 325 MG tablet Take 2 tablets (650 mg total) by  mouth every 6 (six) hours as needed for mild pain or moderate pain. 09/10/13   Isaiah Serge, NP  aspirin EC 81 MG EC tablet Take 1 tablet (81 mg total) by mouth daily. 09/10/13   Isaiah Serge, NP  diazepam (VALIUM) 5 MG tablet Take 2.5-5 mg by mouth at bedtime as needed for anxiety.    Historical Provider, MD  diclofenac sodium (VOLTAREN) 1 % GEL Apply 1 application topically daily as needed (fibromyalgia).    Historical Provider, MD  digoxin (LANOXIN) 0.125 MG tablet Take 1  tablet (0.125 mg total) by mouth daily. 12/03/13   Jolaine Artist, MD  furosemide (LASIX) 40 MG tablet Take 1 tablet (40 mg total) by mouth daily. 12/03/13   Jolaine Artist, MD  HYDROcodone-acetaminophen (NORCO/VICODIN) 5-325 MG per tablet Take 1 tablet by mouth 2 (two) times daily as needed for moderate pain.     Historical Provider, MD  levothyroxine (SYNTHROID, LEVOTHROID) 150 MCG tablet Take 150 mcg by mouth daily before breakfast.    Historical Provider, MD  lisinopril (PRINIVIL,ZESTRIL) 2.5 MG tablet Take 1 tablet (2.5 mg total) by mouth at bedtime. 09/11/13   Isaiah Serge, NP  metoprolol succinate (TOPROL-XL) 25 MG 24 hr tablet Take 0.5 tablets (12.5 mg total) by mouth daily. 09/10/13   Isaiah Serge, NP  polyethylene glycol [facility] / GLYCOLAX) packet Take 17 g by mouth daily as needed for moderate constipation. 09/10/13   Isaiah Serge, NP  sertraline (ZOLOFT) 100 MG tablet Take 100 mg by mouth daily.    Historical Provider, MD  tiZANidine (ZANAFLEX) 4 MG tablet Take 2 mg by mouth 2 (two) times daily as needed for muscle spasms (for fibromyalgia).    Historical Provider, MD    Family History  Family History  Problem Relation Age of Onset  . Hypertension Mother     deceased: adenocarcinoma, HLD  . Cancer Father     deceased: basal cell carcinoma  . Cancer Sister     breast  . Cancer Brother     skin  . Hyperlipidemia Brother    Family Status  Relation Status Death Age  . Mother Deceased   . Father Deceased   . Sister Alive   . Brother Alive   . Brother Alive      Social History  History   Social History  . Marital Status: Widowed    Spouse Name: N/A    Number of Children: N/A  . Years of Education: N/A   Occupational History  . Not on file.   Social History Main Topics  . Smoking status: Former Smoker -- 1.00 packs/day for 16 years    Types: Cigarettes    Start date: 04/22/1972    Quit date: 09/25/1988  . Smokeless tobacco: Never Used     Comment:  Quit around 68  . Alcohol Use: Yes     Comment: occasionally  . Drug Use: No  . Sexual Activity: Not on file   Other Topics Concern  . Not on file   Social History Narrative   Lives in [place] by herself and daughter lives next door. Retired [place]   . All other systems reviewed and are otherwise negative except as noted above.  Physical Exam  Blood pressure 90/46, pulse 63, temperature 98.1 F (36.7 C), temperature source Oral, resp. rate 15, height 5\' 3"  (1.6 m), weight 174 lb (78.926 kg), SpO2 100 %.  General: Pleasant, NAD Psych: Normal affect. Neuro: Alert and oriented  X 3. Moves all extremities spontaneously. HEENT: Normal  Neck: Supple without bruits or JVD. Lungs:  Resp regular and unlabored, CTA. Heart: RRR no s3, s4, or murmurs. Device site with some erythema and ecchymosis. +mild systolic murmur at apex Abdomen: Soft, non-tender, non-distended, BS + x 4.  Extremities: No clubbing, cyanosis or edema. DP/PT/Radials 2+ and equal bilaterally.  Labs  No results for input(s): CKTOTAL, CKMB, TROPONINI in the last 72 hours. Lab Results  Component Value Date   WBC 8.6 01/17/2014   HGB 12.4 01/17/2014   HCT 37.3 01/17/2014   MCV 90.3 01/17/2014   PLT 160 01/17/2014    Radiology/Studies  Dg Chest 2 View  01/13/2014   CLINICAL DATA:  Post pacemaker implantation  EXAM: CHEST  2 VIEW  COMPARISON:  10/22/2013; 09/05/2013; chest CT - 09/05/2013  FINDINGS: Grossly unchanged cardiac silhouette and mediastinal contours. Interval placement of left anterior chest wall dual lead AICD/pacemaker with lead tips overlying the expected location of the right atrium, ventricle and coronary sinus. No pneumothorax. No focal airspace opacities. No pleural effusion or pneumothorax. No evidence of edema. No acute osseus abnormalities. A gastric band device overlies expected location of the gastric fundus.  IMPRESSION: Post left anterior chest wall 3 lead AICD / pacemaker implantation without evidence  of complication.   Electronically Signed   By: Sandi Mariscal M.D.   On: 01/13/2014 05:57   Dg Chest Portable 1 View  01/17/2014   CLINICAL DATA:  Chest pain  EXAM: PORTABLE CHEST - 1 VIEW  COMPARISON:  01/13/2014  FINDINGS: Low lung volumes. No focal consolidation. No pleural effusion or pneumothorax.  Cardiomegaly.  Left subclavian ICD.  IMPRESSION: No evidence of acute cardiopulmonary disease.   Electronically Signed   By: Julian Hy M.D.   On: 01/17/2014 13:50   2d ECHO Study Date: 12/03/2013 LV EF: 20% -  25% History:  PMH: LBBB. Nonischemic cardiomyopathy. At risk for sudden cardiac death, life vest user.CKD II. Dyspnea. Study Conclusions - Left ventricle: The cavity size was mildly dilated. Systolic function was severely reduced with asynchronous contraction. The estimated ejection fraction was in the range of 20% to 25%. There is akinesis of the anteroseptal and lateral myocardium. Doppler parameters are consistent with abnormal left ventricular relaxation (grade 1 diastolic dysfunction). - Mitral valve: There was mild to moderate regurgitation. - Left atrium: The atrium was mildly dilated. Impressions: - Minimal change in EF when compared to prior study.  ECG  HR 61 Atrial-ventricular dual-paced rhythm  ASSESSMENT AND PLAN  [name] is a 59 y.o. female with a history of non-ischemic CM (EF 20-25%) s/p recent ICD/PPM, obesity, depression/anxiety, LBBB, hypothyroidism who presented today with chest pain, dizziness and pain/swelling at device surgical site.   Chronic systolic CHF/non-ischemic CM-  -- She was initially admitted for SOB 8/28-09/11/13 and found to have newly diagnosed systolic HF. Underwent R/LHC which showed normal filling pressures and nl coronaries. EF 15-20%on ECHO (08/2013). This improved to 20-25% 12/03/13.  -- ICD/PM placed on 01/12/14 by Dr Caryl Comes.  -- BNP 70.3, CXR clear, no s/s volume overload. Creat slightly bumped at 1.19. Soft  pressures. I think she has been over diuresed.  Will hold Lasix for now.   Dizziness- sounds orthostatic. Will get orthostatic vital signs. Holding lasix and lisinopril  ICD/PM- dual chamber St Jude ICD/PM placed for NICM EF 20-25% and LBBB -- Possible infection. Will get 2D ECHO to rule out effusion. Indurate pocket. Will have EP see tomorrow AM in rounds.  Elevated Troponin - cycles enzymes. Possibly due to mechanical issue with recent device placement  Chest pain- atypical, normal coronaries in 08/2013  -- Will cycle heart enzymes   Hypotension- 70/40 this AM. BPs still soft but SBP in 90s now.  -- Hold lasix and lisinpril. Will continue Toprol XL 12.5 mg   Renea Ee 01/17/2014, 2:44 PM  Pager [phone]   Attending note:  Extensive records reviewed, patient seen and examined. Discussed the case with Ms. Liz Claiborne. History of nonischemic cardiomyopathy with LVEF 20-25%, left bundle-branch block, recently status post placement of a St. Jude dual-chamber ICD with LV lead for resynchronization therapy by Dr. Caryl Comes. She was discharged to discharge on January 5. Since that time she states she has been more dizzy than normal, particularly when standing up. She has also noted some induration in the area of her device pocket site although no drainage, no fevers or chills. She has had tenderness in this region as well. No device shocks. She developed chest discomfort described as a burning tightness earlier today when she was bending over. Became very dizzy after that as well. She came in through the ER, reportedly had a systolic blood pressure in the 70s per EMS prior to arrival. In ER blood pressure in the 90s, and she states that it typically runs "low." She has been compliant with her medications. Troponin I level came back at 0.30. Chest x-ray shows stable cardiomegaly. ECG shows dual chamber paced rhythm. Plan at this time is admission to the hospital, cycle cardiac  markers. Doubt ACS, more likely abnormal troponin in setting of recent device placement and known cardiomyopathy, also need to exclude mechanical complication. Echocardiogram will be obtained to rule out pericardial effusion. Will also have EP service evaluate and make sure that wound site does not require further assessment. We will hold ACE inhibitor and Lasix for now given low blood pressures, try and continue low-dose Toprol-XL and digoxin. No anticoagulation.   Satira Sark, M.D., F.A.C.C.

## 2014-01-17 NOTE — ED Notes (Signed)
Cardiology at bedside.

## 2014-01-17 NOTE — ED Notes (Signed)
Per EMS- pt had an internal defib/pace placed to left chest placed on Monday. Site of insertion is causing pain 8/10. Pt also c/o substernal chest pain 2/10 after receiving 324 ASA by EMS. Pt has hx of chronic heart failure. Site of insertion of defib.pace is red. Pacer set at 10

## 2014-01-18 DIAGNOSIS — I517 Cardiomegaly: Secondary | ICD-10-CM

## 2014-01-18 DIAGNOSIS — R42 Dizziness and giddiness: Secondary | ICD-10-CM | POA: Diagnosis not present

## 2014-01-18 DIAGNOSIS — I429 Cardiomyopathy, unspecified: Secondary | ICD-10-CM | POA: Diagnosis not present

## 2014-01-18 DIAGNOSIS — R079 Chest pain, unspecified: Secondary | ICD-10-CM | POA: Diagnosis not present

## 2014-01-18 DIAGNOSIS — I5022 Chronic systolic (congestive) heart failure: Secondary | ICD-10-CM | POA: Diagnosis not present

## 2014-01-18 LAB — COMPREHENSIVE METABOLIC PANEL
ALBUMIN: 3.5 g/dL (ref 3.5–5.2)
ALT: 21 U/L (ref 0–35)
ANION GAP: 6 (ref 5–15)
AST: 29 U/L (ref 0–37)
Alkaline Phosphatase: 79 U/L (ref 39–117)
BUN: 24 mg/dL — AB (ref 6–23)
CO2: 29 mmol/L (ref 19–32)
Calcium: 10.3 mg/dL (ref 8.4–10.5)
Chloride: 105 mEq/L (ref 96–112)
Creatinine, Ser: 0.95 mg/dL (ref 0.50–1.10)
GFR calc non Af Amer: 66 mL/min — ABNORMAL LOW (ref >90)
GFR, EST AFRICAN AMERICAN: 76 mL/min — AB (ref >90)
Glucose, Bld: 92 mg/dL (ref 70–99)
Potassium: 3.9 mmol/L (ref 3.5–5.1)
SODIUM: 140 mmol/L (ref 135–145)
TOTAL PROTEIN: 6.7 g/dL (ref 6.0–8.3)
Total Bilirubin: 0.4 mg/dL (ref 0.3–1.2)

## 2014-01-18 LAB — TROPONIN I
TROPONIN I: 0.24 ng/mL — AB (ref <0.031)
Troponin I: 0.28 ng/mL — ABNORMAL HIGH (ref <0.031)

## 2014-01-18 LAB — CBC
HCT: 37.9 % (ref 36.0–46.0)
Hemoglobin: 12.4 g/dL (ref 12.0–15.0)
MCH: 29.6 pg (ref 26.0–34.0)
MCHC: 32.7 g/dL (ref 30.0–36.0)
MCV: 90.5 fL (ref 78.0–100.0)
PLATELETS: 159 10*3/uL (ref 150–400)
RBC: 4.19 MIL/uL (ref 3.87–5.11)
RDW: 12.9 % (ref 11.5–15.5)
WBC: 6.8 10*3/uL (ref 4.0–10.5)

## 2014-01-18 LAB — LIPID PANEL
CHOL/HDL RATIO: 3.6 ratio
CHOLESTEROL: 226 mg/dL — AB (ref 0–200)
HDL: 62 mg/dL (ref >39)
LDL Cholesterol: 124 mg/dL — ABNORMAL HIGH (ref 0–99)
TRIGLYCERIDES: 202 mg/dL — AB (ref <150)
VLDL: 40 mg/dL (ref 0–40)

## 2014-01-18 LAB — PROTIME-INR
INR: 1.02 (ref 0.00–1.49)
PROTHROMBIN TIME: 13.5 s (ref 11.6–15.2)

## 2014-01-18 NOTE — Progress Notes (Addendum)
Discussed plans for d/c with patient. Dr. Domenic Polite had initially felt patient was not suitable for discharge today. He was concerned that patient hadn't ambulated yet today, and today was the first day that her meds (Lasix and lisinopril) were held. She had taken everything yesterday. I discussed with the patient. I gave the patient the option of a) discharge this afternoon if she ambulates without difficulty, vs. B) observation overnight with ambulation, orthostatic monitoring, and possible re-addition of meds tomorrow AM if stable.  She prefers the latter option which I agree is reasonable.  For now she remains on low-dose Toprol. Will ask nursing to ambulate and check orthostatics now, this evening and tomorrow morning. She will ambulate in the hall with assistance. Tentatively echo tech said she did not see pericardial effusion but will await formal echo read. Dayna Dunn PA-C

## 2014-01-18 NOTE — Progress Notes (Signed)
Utilization Review Completed.   Klarisa Barman, RN, BSN Nurse Case Manager  

## 2014-01-18 NOTE — Progress Notes (Signed)
  Echocardiogram 2D Echocardiogram has been performed.  [name] FRANCES 01/18/2014, 12:26 PM

## 2014-01-18 NOTE — Progress Notes (Signed)
Primary cardiologist: Dr. Carlyle Dolly EP: Dr. Virl Axe  Seen for followup: Chest pain and dizziness  Subjective:    Feels better today. Episode of chest tightness has resolved. Still sore at her device pocket site.  Objective:   Temp:  [97.4 F (36.3 C)-98.2 F (36.8 C)] 98.2 F (36.8 C) (01/10 0516) Pulse Rate:  [59-69] 65 (01/10 0516) Resp:  [13-21] 16 (01/10 0516) BP: (84-119)/(35-61) 119/61 mmHg (01/10 0516) SpO2:  [94 %-100 %] 94 % (01/10 0516) FiO2 (%):  [28 %] 28 % (01/09 1802) Weight:  [174 lb (78.926 kg)-182 lb 8.7 oz (82.8 kg)] 177 lb 8 oz (80.513 kg) (01/10 0516) Last BM Date: 01/17/14  Filed Weights   01/17/14 1304 01/17/14 1756 01/18/14 0516  Weight: 174 lb (78.926 kg) 182 lb 8.7 oz (82.8 kg) 177 lb 8 oz (80.513 kg)   No intake or output data in the 24 hours ending 01/18/14 1029  Telemetry: Paced rhythm.  Exam:   General: Appears comfortable at rest.  Thorax: Induration at device pocket site with evidence of fluid collection, no drainage.  Lungs: Clear, nonlabored.  Cardiac: RRR, indistinct PMI, no gallop.  Extremities: No pitting edema.  Lab Results:  Basic Metabolic Panel:  Recent Labs Lab 01/17/14 1307 01/18/14 0619  NA 138 140  K 4.2 3.9  CL 102 105  CO2 28 29  GLUCOSE 109* 92  BUN 24* 24*  CREATININE 1.19* 0.95  CALCIUM 10.1 10.3    Liver Function Tests:  Recent Labs Lab 01/17/14 1307 01/18/14 0619  AST 35 29  ALT 22 21  ALKPHOS 76 79  BILITOT 0.4 0.4  PROT 6.7 6.7  ALBUMIN 3.6 3.5    CBC:  Recent Labs Lab 01/17/14 1307 01/18/14 0619  WBC 8.6 6.8  HGB 12.4 12.4  HCT 37.3 37.9  MCV 90.3 90.5  PLT 160 159    Cardiac Enzymes:  Recent Labs Lab 01/17/14 1846 01/18/14 0007 01/18/14 0619  TROPONINI 0.28* 0.28* 0.24*    BNP:  Recent Labs  09/10/13 0645 10/01/13 1016 10/22/13 1901  PROBNP 2408.0* 2096.0* 2284.0*    Coagulation:  Recent Labs Lab 01/17/14 1846 01/18/14 0619  INR 1.01  1.02     Medications:   Scheduled Medications: . aspirin  324 mg Oral NOW   Or  . aspirin  300 mg Rectal NOW  . aspirin EC  81 mg Oral Daily  . digoxin  0.125 mg Oral Daily  . heparin  5,000 Units Subcutaneous 3 times per day  . levothyroxine  150 mcg Oral QAC breakfast  . metoprolol succinate  12.5 mg Oral Daily  . sertraline  100 mg Oral Daily  . sodium chloride  3 mL Intravenous Q12H    PRN Medications: sodium chloride, acetaminophen, diazepam, HYDROcodone-acetaminophen, nitroGLYCERIN, ondansetron (ZOFRAN) IV, sodium chloride, tiZANidine   Assessment:   1. Chest discomfort, resolved. Doubt ischemic with history of normal coronaries and nonischemic cardiomyopathy, she is status post recent CRT-D with St. Jude device per Dr. Caryl Comes last week, evaluating for potential mechanical complications. Echocardiogram is pending to assess for pericardial effusion. Otherwise rhythm is stable and paced, chest x-ray shows no acute process.  2. Status post St. Jude CRT-D. Pocket induration and tenderness noted, Dr. Rayann Heman notified and asked to evaluate.  3. Nonischemic cardiomyopathy with LVEF 20-25% and left bundle branch block.  4. Elevated troponin I, suspect related to either cardiomyopathy, or potentially recent device placement. Pattern is flat. No clear ACS.  5. Orthostatic dizziness.  Patient reports "low "blood pressures for quite some time, this has limited medical therapy. For now we have held both ACE inhibitor and Lasix, continues on Toprol-XL and digoxin.   Plan/Discussion:    Follow-up orthostatics. Ambulate as tolerated. EP to reassess CRT-D and site. Echocardiogram planned to assess for pericardial effusion.    Satira Sark, M.D., F.A.C.C.

## 2014-01-18 NOTE — Progress Notes (Signed)
IV infiltrated earlier in the day. RNs did not see anything to stick. Pt did not want to be stuck again at this time. Pt educated about need for IV in emergent situation. Verbalized understanding. Will continue to monitor pt closely.

## 2014-01-18 NOTE — Progress Notes (Signed)
Pt ambulated with steady gait in hallway with family.  Pt did not have any complaints. Will continue to monitor pt closely.

## 2014-01-18 NOTE — Progress Notes (Signed)
I have seen, examined the patient, and reviewed with Dr Domenic Polite.    The patient is s/p recent SJM CRT-D implant.  She did well initially following implantation.  She has chronic low BP with SBP 90s at home.  Yesterday, she developed chest pain while lifting things at home.  She describes this as "twinges" of discomfort.  She also had mild postural dizziness.  At that time, her SBP was 70s.  She presented to [facility] and has been observed overnight.  She is clinically stable.  I have reviewed [employer] device transmission which reveals normal device function.  CXR reveals stable leads and no ptx.  She has had an echo today which remains pending.  Physical Exam: Filed Vitals:   01/17/14 1756 01/17/14 1802 01/17/14 2104 01/18/14 0516  BP: 100/35  115/47 119/61  Pulse: 60  69 65  Temp: 97.4 F (36.3 C)  98.2 F (36.8 C) 98.2 F (36.8 C)  TempSrc: Oral  Oral Oral  Resp: 16  16 16   Height: 5\' 3"  (1.6 m)     Weight: 182 lb 8.7 oz (82.8 kg)   177 lb 8 oz (80.513 kg)  SpO2: 99% 99% 96% 94%    GEN- The patient is well appearing, alert and oriented x 3 today.   Head- normocephalic, atraumatic Eyes-  Sclera clear, conjunctiva pink Ears- hearing intact Oropharynx- clear Neck- supple, Lungs- Clear to ausculation bilaterally, normal work of breathing Heart- Regular rate and rhythm  GI- soft, NT, ND, + BS Extremities- no clubbing, cyanosis, or edema, groin is without hematoma/ bruit MS- no significant deformity or atrophy Skin- ICD pocket is without hematoma/ drainage, closed by dermabond with chemical irritation as typically seen along the incision line.   Psych- euthymic mood, full affect Neuro- strength and sensation are intact  A/P 1. S/p BiV ICD Normal device function She is quite sore along her implant area but I think this is normal.  I do not see any evidence of infection Routine wound care and follow-up as scheduled in the device clinic  2. Chronic systolic  dysfunction Appears dry on exam Would reduce her diuretic at discharge.  Not infrequently, diuretic requirement decreases after BiV placement.   She may benefit from transition of care appointment after discharge.  3. Atypical chest pain- resolved I think that lead perforation is unlikely.  By history, her symptoms sound more musculoskeletal.  Echo is pending.  If now effusion, would discharge with close outpatient follow-up by Dr Caryl Comes.  Electrophysiology team to see as needed while here. Please call with questions.   Thompson Grayer, MD 01/18/2014 12:22 PM

## 2014-01-19 DIAGNOSIS — Z95810 Presence of automatic (implantable) cardiac defibrillator: Secondary | ICD-10-CM

## 2014-01-19 DIAGNOSIS — R0789 Other chest pain: Secondary | ICD-10-CM

## 2014-01-19 LAB — CBC
HCT: 37.4 % (ref 36.0–46.0)
HEMOGLOBIN: 12.4 g/dL (ref 12.0–15.0)
MCH: 30 pg (ref 26.0–34.0)
MCHC: 33.2 g/dL (ref 30.0–36.0)
MCV: 90.3 fL (ref 78.0–100.0)
Platelets: 162 10*3/uL (ref 150–400)
RBC: 4.14 MIL/uL (ref 3.87–5.11)
RDW: 12.8 % (ref 11.5–15.5)
WBC: 8 10*3/uL (ref 4.0–10.5)

## 2014-01-19 LAB — BASIC METABOLIC PANEL
ANION GAP: 7 (ref 5–15)
BUN: 20 mg/dL (ref 6–23)
CO2: 28 mmol/L (ref 19–32)
CREATININE: 0.79 mg/dL (ref 0.50–1.10)
Calcium: 10.1 mg/dL (ref 8.4–10.5)
Chloride: 105 mEq/L (ref 96–112)
Glucose, Bld: 87 mg/dL (ref 70–99)
POTASSIUM: 4.3 mmol/L (ref 3.5–5.1)
Sodium: 140 mmol/L (ref 135–145)

## 2014-01-19 MED ORDER — FUROSEMIDE 40 MG PO TABS: 20.0000 mg | ORAL_TABLET | Freq: Every day | ORAL | Status: DC

## 2014-01-19 NOTE — Discharge Summary (Signed)
Discharge Summary   Patient ID: [name],  MRN: [id number], DOB/AGE: 57-16-1957 57 y.o.  Admit date: 01/17/2014 Discharge date: 01/19/2014  Primary Care Provider: VYAS,DHRUV B. Primary Cardiologist: Dr. Harl Bowie Primary Electrophysiologist: Dr. Caryl Comes Primary Heart Failure doctor: Dr. Haroldine Laws  Discharge Diagnoses Principal Problem:   Chest pain Active Problems:   S/P cardiac cath, no evidence of CAD 09/08/13   Hypothyroidism   OSA on CPAP   Nonischemic cardiomyopathy   ICD (implantable cardioverter-defibrillator), dual, in situ - St. Jude 01/2014   ICD (implantable cardioverter-defibrillator) in place   Allergies Allergies  Allergen Reactions  . Nucynta [Tapentadol] Itching  . Percocet [Oxycodone-Acetaminophen] Itching    Patient can tolerate acetaminophen    Procedures  Echocardiogram 01/18/2014 LV EF: 45% -  50%  ------------------------------------------------------------------- Indications:   Chest pain 786.51. Pericardial effusion 423.9.  ------------------------------------------------------------------- History:  PMH:  Non-ischemic cardiomyopathy. Risk factors: Former tobacco use.  ------------------------------------------------------------------- Study Conclusions  - Left ventricle: The cavity size was normal. There was mild concentric hypertrophy. Systolic function was mildly reduced. The estimated ejection fraction was in the range of 45% to 50%. Features are consistent with a pseudonormal left ventricular filling pattern, with concomitant abnormal relaxation and increased filling pressure (grade 2 diastolic dysfunction). - Ventricular septum: Septal motion showed abnormal function, dyssynergy, and paradox. - Left atrium: The atrium was mildly dilated. - Pulmonary arteries: PA peak pressure: 31 mm Hg (S).    Hospital Course  The patient is a 59 year old female with history of nonischemic cardiomyopathy with previous  baseline 20-25%, obesity, obstructive sleep apnea on CPAP. Her previous cardiac catheterization in August 2015 showed normal coronary with elevated filling pressure. She has been established with our heart failure clinic as well and previously on 40 mg daily of Lasix. She recently underwent St. Jude BiV ICD placement on 01/12/2014.   She presented to [facility] on 01/17/2014 with chest pain, dizziness, pain and swelling at the device surgical site. On arrival, she had mildly elevated troponin of 0.3 which subsequently trended down. Given the atypical nature of her chest pain and recent device placement, the elevated troponin was felt likely related to recent operation. She also has some dizziness on arrival. She did have some mild drop in blood pressure change from lying to standing however it wasn't significant enough. Lying blood pressure 124/51 with HR 66, sitting BP 116/44, standing blood pressure 109/52 with HR 71, standing blood pressure after 3 minutes 112/60. Her Lasix and lisinopril was initially held given her hypotension and dizziness. She was seen by electrophysiology team on 01/18/2014, it was felt her mild swelling around the surgical site is normal. Echocardiogram was obtained which showed significant improvement in the ejection fraction when compared to the November echo, EF now 16-96%, grade 2 diastolic dysfunction. There was no pericardial effusion to suspect lead perforation. Given the improvement in the EF, she will be discharged on lower dose of Lasix 20 mg daily. She has been instructed to take additional Lasix if she has worsening swelling.  She was seen the morning of 01/19/2014, at which time she denies any further chest pain, or dizziness. She has been ambulating without significant dizziness or chest discomfort. She is deemed stable for discharge from cardiology perspective. Overnight, her systolic pressure has been in the 110s to 120s range, we will resume her low-dose ACE  inhibitor on discharge. She has multiple follow-up scheduled including pacemaker wound check on 1/14, and heart failure follow-up on 1/18. She should follow-up with Dr. Harl Bowie in  the [place] office in one month after her recent follow-up.   Discharge Vitals Blood pressure 122/57, pulse 66, temperature 98.1 F (36.7 C), temperature source Oral, resp. rate 16, height 5\' 3"  (1.6 m), weight 177 lb (80.287 kg), SpO2 95 %.  Filed Weights   01/17/14 1756 01/18/14 0516 01/19/14 0554  Weight: 182 lb 8.7 oz (82.8 kg) 177 lb 8 oz (80.513 kg) 177 lb (80.287 kg)    Labs  CBC  Recent Labs  01/18/14 0619 01/19/14 0332  WBC 6.8 8.0  HGB 12.4 12.4  HCT 37.9 37.4  MCV 90.5 90.3  PLT 159 161   Basic Metabolic Panel  Recent Labs  01/18/14 0619 01/19/14 0332  NA 140 140  K 3.9 4.3  CL 105 105  CO2 29 28  GLUCOSE 92 87  BUN 24* 20  CREATININE 0.95 0.79  CALCIUM 10.3 10.1   Liver Function Tests  Recent Labs  01/17/14 1307 01/18/14 0619  AST 35 29  ALT 22 21  ALKPHOS 76 79  BILITOT 0.4 0.4  PROT 6.7 6.7  ALBUMIN 3.6 3.5   Cardiac Enzymes  Recent Labs  01/17/14 1846 01/18/14 0007 01/18/14 0619  TROPONINI 0.28* 0.28* 0.24*   Fasting Lipid Panel  Recent Labs  01/18/14 0619  CHOL 226*  HDL 62  LDLCALC 124*  TRIG 202*  CHOLHDL 3.6    Disposition  Pt is being discharged home today in good condition.  Follow-up Plans & Appointments      Follow-up Information    Follow up with [facility] On 01/22/2014.   Specialty:  Cardiology   Why:  Post ICD device check. 10:30am   Contact information:   [address], [place] [phone]      Follow up with [facility] On 01/26/2014.   Why:  2:40pm. For questions, call heart failure clinic at [phone]      Follow up with Arnoldo Lenis, MD.   Specialty:  Cardiology   Why:  Please call our office to followup with Dr. Harl Bowie in 1 month after finish  followup with device clinic and heart failure clinic   Contact information:   [place] [address] [phone]       Discharge Medications    Medication List    TAKE these medications        acetaminophen 325 MG tablet  Commonly known as:  TYLENOL  Take 2 tablets (650 mg total) by mouth every 6 (six) hours as needed for mild pain or moderate pain.     aspirin 81 MG EC tablet  Take 1 tablet (81 mg total) by mouth daily.     diazepam 5 MG tablet  Commonly known as:  VALIUM  Take 2.5-5 mg by mouth at bedtime as needed for anxiety.     diclofenac sodium 1 % Gel  Commonly known as:  VOLTAREN  Apply 1 application topically daily as needed (fibromyalgia).     digoxin 0.125 MG tablet  Commonly known as:  LANOXIN  Take 1 tablet (0.125 mg total) by mouth daily.     furosemide 40 MG tablet  Commonly known as:  LASIX  Take 0.5 tablets (20 mg total) by mouth daily.     HYDROcodone-acetaminophen 5-325 MG per tablet  Commonly known as:  NORCO/VICODIN  Take 1 tablet by mouth 2 (two) times daily as needed for moderate pain.     levothyroxine 150 MCG tablet  Commonly known as:  SYNTHROID, LEVOTHROID  Take 150 mcg by mouth daily before breakfast.     lisinopril 2.5 MG tablet  Commonly known as:  PRINIVIL,ZESTRIL  Take 1 tablet (2.5 mg total) by mouth at bedtime.     metoprolol succinate 25 MG 24 hr tablet  Commonly known as:  TOPROL-XL  Take 0.5 tablets (12.5 mg total) by mouth daily.     polyethylene glycol packet  Commonly known as:  MIRALAX / GLYCOLAX  Take 17 g by mouth daily as needed for moderate constipation.     sertraline 100 MG tablet  Commonly known as:  ZOLOFT  Take 100 mg by mouth daily.     tiZANidine 4 MG tablet  Commonly known as:  ZANAFLEX  Take 2 mg by mouth 2 (two) times daily.          Duration of Discharge Encounter   Greater than 30 minutes including physician time.  Hilbert Corrigan PA-C Pager:  [phone] 01/19/2014, 9:26 AM   Personally seen and examined. Agree with above. Candee Furbish, MD

## 2014-01-19 NOTE — Progress Notes (Signed)
Patient Name: [name] Date of Encounter: 01/19/2014     Active Problems:   Chest pain    SUBJECTIVE  Denies any further CP. No SOB, ambulated yesterday ok without further dizziness  CURRENT MEDS . aspirin EC  81 mg Oral Daily  . digoxin  0.125 mg Oral Daily  . heparin  5,000 Units Subcutaneous 3 times per day  . levothyroxine  150 mcg Oral QAC breakfast  . metoprolol succinate  12.5 mg Oral Daily  . sertraline  100 mg Oral Daily  . sodium chloride  3 mL Intravenous Q12H    OBJECTIVE  Filed Vitals:   01/18/14 0516 01/18/14 1424 01/18/14 2057 01/19/14 0554  BP: 119/61 113/60 114/42 122/57  Pulse: 65 69 66 66  Temp: 98.2 F (36.8 C) 97.8 F (36.6 C) 98.1 F (36.7 C) 98.1 F (36.7 C)  TempSrc: Oral Oral Oral Oral  Resp: 16 16 20 16   Height:      Weight: 177 lb 8 oz (80.513 kg)   177 lb (80.287 kg)  SpO2: 94% 96% 98% 95%    Intake/Output Summary (Last 24 hours) at 01/19/14 0828 Last data filed at 01/18/14 1700  Gross per 24 hour  Intake    240 ml  Output      0 ml  Net    240 ml   Filed Weights   01/17/14 1756 01/18/14 0516 01/19/14 0554  Weight: 182 lb 8.7 oz (82.8 kg) 177 lb 8 oz (80.513 kg) 177 lb (80.287 kg)    PHYSICAL EXAM  General: Pleasant, NAD. Neuro: Alert and oriented X 3. Moves all extremities spontaneously. Psych: Normal affect. HEENT:  Normal  Neck: Supple without bruits or JVD. Lungs:  Resp regular and unlabored, CTA. Heart: RRR no s3, s4, or murmurs. L pectoral ppm site stable, mild swelling, localized readness. No bleeding, no drainage Abdomen: Soft, non-tender, non-distended, BS + x 4.  Extremities: No clubbing, cyanosis or edema. DP/PT/Radials 2+ and equal bilaterally.  Accessory Clinical Findings  CBC  Recent Labs  01/18/14 0619 01/19/14 0332  WBC 6.8 8.0  HGB 12.4 12.4  HCT 37.9 37.4  MCV 90.5 90.3  PLT 159 732   Basic Metabolic Panel  Recent Labs  01/18/14 0619 01/19/14 0332  NA 140 140  K 3.9 4.3  CL  105 105  CO2 29 28  GLUCOSE 92 87  BUN 24* 20  CREATININE 0.95 0.79  CALCIUM 10.3 10.1   Liver Function Tests  Recent Labs  01/17/14 1307 01/18/14 0619  AST 35 29  ALT 22 21  ALKPHOS 76 79  BILITOT 0.4 0.4  PROT 6.7 6.7  ALBUMIN 3.6 3.5   No results for input(s): LIPASE, AMYLASE in the last 72 hours. Cardiac Enzymes  Recent Labs  01/17/14 1846 01/18/14 0007 01/18/14 0619  TROPONINI 0.28* 0.28* 0.24*   BNP Invalid input(s): POCBNP D-Dimer No results for input(s): DDIMER in the last 72 hours. Hemoglobin A1C No results for input(s): HGBA1C in the last 72 hours. Fasting Lipid Panel  Recent Labs  01/18/14 0619  CHOL 226*  HDL 62  LDLCALC 124*  TRIG 202*  CHOLHDL 3.6    TELE Paced rhythm HR 60s    ECG  No new EKG  Echocardiogram 01/18/2014  LV EF: 45% -  50%  ------------------------------------------------------------------- Indications:   Chest pain 786.51. Pericardial effusion 423.9.  ------------------------------------------------------------------- History:  PMH:  Non-ischemic cardiomyopathy. Risk factors: Former tobacco use.  ------------------------------------------------------------------- Study Conclusions  - Left ventricle: The  cavity size was normal. There was mild concentric hypertrophy. Systolic function was mildly reduced. The estimated ejection fraction was in the range of 45% to 50%. Features are consistent with a pseudonormal left ventricular filling pattern, with concomitant abnormal relaxation and increased filling pressure (grade 2 diastolic dysfunction). - Ventricular septum: Septal motion showed abnormal function, dyssynergy, and paradox. - Left atrium: The atrium was mildly dilated. - Pulmonary arteries: PA peak pressure: 31 mm Hg (S).     Radiology/Studies  Dg Chest 2 View  01/13/2014   CLINICAL DATA:  Post pacemaker implantation  EXAM: CHEST  2 VIEW  COMPARISON:  10/22/2013; 09/05/2013; chest  CT - 09/05/2013  FINDINGS: Grossly unchanged cardiac silhouette and mediastinal contours. Interval placement of left anterior chest wall dual lead AICD/pacemaker with lead tips overlying the expected location of the right atrium, ventricle and coronary sinus. No pneumothorax. No focal airspace opacities. No pleural effusion or pneumothorax. No evidence of edema. No acute osseus abnormalities. A gastric band device overlies expected location of the gastric fundus.  IMPRESSION: Post left anterior chest wall 3 lead AICD / pacemaker implantation without evidence of complication.   Electronically Signed   By: Sandi Mariscal M.D.   On: 01/13/2014 05:57   Dg Chest Portable 1 View  01/17/2014   CLINICAL DATA:  Chest pain  EXAM: PORTABLE CHEST - 1 VIEW  COMPARISON:  01/13/2014  FINDINGS: Low lung volumes. No focal consolidation. No pleural effusion or pneumothorax.  Cardiomegaly.  Left subclavian ICD.  IMPRESSION: No evidence of acute cardiopulmonary disease.   Electronically Signed   By: Julian Hy M.D.   On: 01/17/2014 13:50    ASSESSMENT AND PLAN  1. Atypical chest pain  - device functioning normally, lead perforation unlikely. Likely musculoskeletal  - echo 01/18/2014 EF 22-63%, grade 2 diastolic dysfunction, no pericardial effusion noted.  - can be discharged today after resuming ACEI and low dose 20mg  lasix  2. B/p St Jude BiV ICD 01/13/2014  - no sign of infection per EP, swelling normal at this stage  3. Chronic systolic dysfunction: per Dr. Rayann Heman, appears dry on exam, plan to reduce diuretic on discharge. (Not infrequently, diuretic requirement decreases after BiV placement.)  - h/o NICM  - Echo 12/03/2013 EF 33-54%, grade 1 diastolic dysfunction  - Echo 01/18/2014 EF 56-25%, grade 2 diastolic dysfunction, no pericardial effusion noted.  4. Dizziness: some hypotension  - can restart ACEI today  Signed, Woodward Ku Pager: [phone]

## 2014-01-19 NOTE — Progress Notes (Signed)
Reviewed dc instructions with the patient, verbalized understanding by teachback. Dc home with friend at bedside Joylene Draft A

## 2014-01-22 ENCOUNTER — Telehealth (HOSPITAL_COMMUNITY): Payer: Self-pay | Admitting: Vascular Surgery

## 2014-01-22 ENCOUNTER — Ambulatory Visit (INDEPENDENT_AMBULATORY_CARE_PROVIDER_SITE_OTHER): Payer: 59 | Admitting: *Deleted

## 2014-01-22 DIAGNOSIS — I428 Other cardiomyopathies: Secondary | ICD-10-CM

## 2014-01-22 DIAGNOSIS — I429 Cardiomyopathy, unspecified: Secondary | ICD-10-CM

## 2014-01-22 DIAGNOSIS — I5043 Acute on chronic combined systolic (congestive) and diastolic (congestive) heart failure: Secondary | ICD-10-CM

## 2014-01-22 LAB — MDC_IDC_ENUM_SESS_TYPE_INCLINIC
Brady Statistic RA Percent Paced: 39 %
Brady Statistic RV Percent Paced: 98 %
Date Time Interrogation Session: 20160114112929
HighPow Impedance: 61.875
HighPow Impedance: 62 Ohm
Lead Channel Impedance Value: 450 Ohm
Lead Channel Impedance Value: 537.5 Ohm
Lead Channel Impedance Value: 962.5 Ohm
Lead Channel Pacing Threshold Amplitude: 0.5 V
Lead Channel Pacing Threshold Amplitude: 0.5 V
Lead Channel Pacing Threshold Amplitude: 0.625 V
Lead Channel Pacing Threshold Amplitude: 0.875 V
Lead Channel Pacing Threshold Pulse Width: 0.5 ms
Lead Channel Pacing Threshold Pulse Width: 0.5 ms
Lead Channel Pacing Threshold Pulse Width: 0.5 ms
Lead Channel Sensing Intrinsic Amplitude: 11.7 mV
Lead Channel Setting Pacing Amplitude: 3.5 V
Lead Channel Setting Pacing Pulse Width: 0.5 ms
Lead Channel Setting Sensing Sensitivity: 0.5 mV
MDC IDC MSMT BATTERY REMAINING LONGEVITY: 67.2 mo
MDC IDC MSMT LEADCHNL RA SENSING INTR AMPL: 2.5 mV
MDC IDC MSMT LEADCHNL RV PACING THRESHOLD PULSEWIDTH: 0.5 ms
MDC IDC PG SERIAL: 7222096
MDC IDC SET LEADCHNL LV PACING AMPLITUDE: 2 V
MDC IDC SET LEADCHNL RA PACING AMPLITUDE: 1.625
MDC IDC SET LEADCHNL RV PACING PULSEWIDTH: 0.5 ms
MDC IDC SET ZONE DETECTION INTERVAL: 300 ms
Zone Setting Detection Interval: 250 ms

## 2014-01-22 NOTE — Telephone Encounter (Signed)
Pt left message from yesterday... Pt is sick, diarrhea , sneezing, cold pt wants to know what medications can she take with her other medications.. Please advise

## 2014-01-22 NOTE — Telephone Encounter (Signed)
Patient believes she has a "GI bug", diarrhea yesterday but now subsiding.  Encouraged to take imodium OTC PRN and can drink an extra glass of water if she feels real weak or dizzy.  Has appoitnment with [name] Monday.  Aware and agreeable to plan as stated above.  Renee Pain

## 2014-01-22 NOTE — Progress Notes (Signed)
Wound check appointment.  Wound without redness or edema. Incision edges approximated, wound well healed. Normal device function. Thresholds, sensing, and impedances consistent with implant measurements.  LV threshold elevated since implant, device reprogrammed Distal tip 1->Mid 2.   Device programmed at 3.5V for extra safety margin until 3 month visit. Histogram distribution appropriate for patient and level of activity. No mode switches or ventricular arrhythmias noted. Patient educated about wound care, arm mobility, lifting restrictions, shock plan. ROV in 3 months with implanting physician.

## 2014-01-26 ENCOUNTER — Encounter (HOSPITAL_COMMUNITY): Payer: 59

## 2014-01-26 ENCOUNTER — Ambulatory Visit (HOSPITAL_COMMUNITY)
Admission: RE | Admit: 2014-01-26 | Discharge: 2014-01-26 | Disposition: A | Discharge status: Home / Self Care | Payer: 59 | Source: Ambulatory Visit | Attending: Internal Medicine | Admitting: Internal Medicine

## 2014-01-26 VITALS — BP 100/60 | HR 71 | Wt 178.2 lb

## 2014-01-26 DIAGNOSIS — E669 Obesity, unspecified: Secondary | ICD-10-CM | POA: Diagnosis not present

## 2014-01-26 DIAGNOSIS — Z95810 Presence of automatic (implantable) cardiac defibrillator: Secondary | ICD-10-CM | POA: Insufficient documentation

## 2014-01-26 DIAGNOSIS — Z09 Encounter for follow-up examination after completed treatment for conditions other than malignant neoplasm: Secondary | ICD-10-CM | POA: Diagnosis not present

## 2014-01-26 DIAGNOSIS — R079 Chest pain, unspecified: Secondary | ICD-10-CM | POA: Diagnosis not present

## 2014-01-26 DIAGNOSIS — G4733 Obstructive sleep apnea (adult) (pediatric): Secondary | ICD-10-CM | POA: Insufficient documentation

## 2014-01-26 DIAGNOSIS — E039 Hypothyroidism, unspecified: Secondary | ICD-10-CM | POA: Insufficient documentation

## 2014-01-26 DIAGNOSIS — I429 Cardiomyopathy, unspecified: Secondary | ICD-10-CM | POA: Insufficient documentation

## 2014-01-26 DIAGNOSIS — I5022 Chronic systolic (congestive) heart failure: Secondary | ICD-10-CM

## 2014-01-26 MED ORDER — SPIRONOLACTONE 25 MG PO TABS
12.5000 mg | ORAL_TABLET | Freq: Every day | ORAL | Status: DC
Start: 2014-01-26 — End: 2014-05-14

## 2014-01-26 NOTE — Patient Instructions (Signed)
Start Spironolactone 12.5 mg (1/2 tab) daily  Labs in 10 days (bmet)  Your physician recommends that you schedule a follow-up appointment in: 8 weeks

## 2014-01-26 NOTE — Progress Notes (Signed)
Patient ID: [name], female   DOB: May 18, 1955, 59 y.o.   MRN: [id number]  PCP: Dr. Woody Seller [facility]) Primary Cardiologist: Dr. Carlyle Dolly  HPI: [name] is a a 59 yo female with a history of obesity, depression/anxiety, LBBB, hypothyroidism and chronic systolic HF.   Admitted for SOB 8/28-09/11/13 and found to have newly diagnosed systolic HF. Underwent R/LHC which showed normal filling pressures and nl coronaries. EF 15-20%  on ECHO (08/2013)   Follow up for Heart Failure: Since the last visit she had ST Jude CRT-D placed. Lasix was cut back to 20 mg daily. Mild dyspnea with exertion. Uses CPAP nightly. Denies PND/Orthopnea.  Weight at home 175-177 pounds. Tries to walk a mile on the treadmill.    Had sleep study with Dr. Redmond Morgan in [place] last month. AHI 17.   Echo 11/25  Markedly dilated LV EF 20-25% with severe dysynchrony. RV normal. ECHO 01/18/2014 EF 45-50%   ROS: All systems negative except as listed in HPI, PMH and Problem List.  SH:  History   Social History  . Marital Status: Widowed    Spouse Name: N/A    Number of Children: N/A  . Years of Education: N/A   Occupational History  . Not on file.   Social History Main Topics  . Smoking status: Former Smoker -- 1.00 packs/day for 16 years    Types: Cigarettes    Start date: 04/22/1972    Quit date: 09/25/1988  . Smokeless tobacco: Never Used     Comment: Quit around 78  . Alcohol Use: Yes     Comment: occasionally  . Drug Use: No  . Sexual Activity: Not on file   Other Topics Concern  . Not on file   Social History Narrative   Lives in [place] by herself and daughter lives next door. Retired [place]    FH:  Family History  Problem Relation Age of Onset  . Hypertension Mother     deceased: adenocarcinoma, HLD  . Cancer Father     deceased: basal cell carcinoma  . Cancer Sister     breast  . Cancer Brother     skin  . Hyperlipidemia Brother     Past Medical History  Diagnosis Date  . Asthma   .  Bronchitis   . Hypothyroidism   . Fibromyalgia   . Depression   . Anxiety   . Chronic systolic HF (heart failure)     a. NICM b. RHC (09/08/13): RA: 2, RV 26/0/2, PA: 25/9 (15), PCWP: 9, Fick CO/CI: 4.6 / 2.5, PA 68% c. ECHO (08/2013) EF 20-25%, grade II DD, RV sys fx mildly reduced, mod TR  . At risk for sudden cardiac death, life vest    . NICM (nonischemic cardiomyopathy)     a. LHC (08/2013): no angiographic evidence of CAD  . Left bundle branch block   . ICD (implantable cardioverter-defibrillator), dual, in situ 01/12/14    St. Jude ; for primary prevention for EF<35%    Current Outpatient Prescriptions  Medication Sig Dispense Refill  . acetaminophen (TYLENOL) 325 MG tablet Take 2 tablets (650 mg total) by mouth every 6 (six) hours as needed for mild pain or moderate pain.    Marland Kitchen aspirin EC 81 MG EC tablet Take 1 tablet (81 mg total) by mouth daily.    . diazepam (VALIUM) 5 MG tablet Take 2.5-5 mg by mouth at bedtime as needed for anxiety.    . diclofenac sodium (VOLTAREN) 1 % GEL  Apply 1 application topically daily as needed (fibromyalgia).    Marland Kitchen digoxin (LANOXIN) 0.125 MG tablet Take 1 tablet (0.125 mg total) by mouth daily. 30 tablet 3  . furosemide (LASIX) 40 MG tablet Take 0.5 tablets (20 mg total) by mouth daily. 30 tablet 0  . HYDROcodone-acetaminophen (NORCO/VICODIN) 5-325 MG per tablet Take 1 tablet by mouth 2 (two) times daily as needed for moderate pain.     Marland Kitchen levothyroxine (SYNTHROID, LEVOTHROID) 150 MCG tablet Take 150 mcg by mouth daily before breakfast.    . lisinopril (PRINIVIL,ZESTRIL) 2.5 MG tablet Take 1 tablet (2.5 mg total) by mouth at bedtime. 30 tablet 6  . metoprolol succinate (TOPROL-XL) 25 MG 24 hr tablet Take 0.5 tablets (12.5 mg total) by mouth daily. 30 tablet 6  . polyethylene glycol (MIRALAX / GLYCOLAX) packet Take 17 g by mouth daily as needed for moderate constipation. 14 each 0  . sertraline (ZOLOFT) 100 MG tablet Take 100 mg by mouth daily.    Marland Kitchen  tiZANidine (ZANAFLEX) 4 MG tablet Take 2 mg by mouth 2 (two) times daily.      No current facility-administered medications for this encounter.    Filed Vitals:   01/26/14 1455  BP: 100/60  Pulse: 71  Weight: 178 lb 3.2 oz (80.831 kg)  SpO2: 96%    PHYSICAL EXAM: General:  Well appearing. No resp difficulty HEENT: normal Neck: supple. JVP flat. Carotids 2+ bilaterally; no bruits. No lymphadenopathy or thryomegaly appreciated. Cor: PMI laterally displaced. Regular rate & rhythm. No rubs, gallops or murmurs.  Lungs: clear Abdomen: soft, nontender, nondistended. No hepatosplenomegaly. No bruits or masses. Good bowel sounds. Extremities: no cyanosis, clubbing, rash, edema Neuro: alert & orientedx3, cranial nerves grossly intact. Moves all 4 extremities w/o difficulty. Affect pleasant.   ASSESSMENT & PLAN:  1) Chronic systolic HF: NICM, EF 39-03%, severe dysynchrony, RV nl. (11/2013).--> ECHO EF 45-50% 01/2014.  Etiology unclear - ? OSA - NYHA III symptoms and volume status stable. Will continue lasix 20 mg daily. She can hold lasix if she develops dizziness.  -Continue 12.5 mg Toprol XL daily and digoxin 0.125 mg daily.  - Continue lisinopril 2.5 mg daily.   -Add 12.5 mg spironolactone daily - Now has ST Jude CRT-D. -  - Reinforced the need and importance of daily weights, a low sodium diet, and fluid restriction (less than 2 L a day). Instructed to call the HF clinic if weight increases more than 3 lbs overnight or 5 lbs in a week.  2) LBBB - S/P St Jude CRT-D  3) Obesity - Encouraged to try and lose weight and watch portion control. 4) OSA - AHI 17. Continue nightly CPAP.    Follow up in 2 months. Check BMET in 10 days.  CLEGG,AMY NP-C   Patient seen and examined with Darrick Grinder, NP. We discussed all aspects of the encounter. I agree with the assessment and plan as stated above.   Stable. Volume status looks ok. BP somewhat soft. Will attempt to add on spironolactone. Check  BMET in 1 week.   Quest Tavenner,MD 10:16 AM

## 2014-02-04 ENCOUNTER — Telehealth (HOSPITAL_COMMUNITY): Payer: Self-pay

## 2014-02-04 NOTE — Telephone Encounter (Signed)
Patient c/o dull constant headache since starting 12.5mg  spironolactone once daily.  Per Darrick Grinder NP-C and pharmacist Doroteo Bradford, can be initial side effect but not likely to continue.  Advised to stop lasix to see if this helps.  Patient to call us back Friday with update.  [name]

## 2014-02-09 ENCOUNTER — Encounter: Payer: Self-pay | Admitting: Internal Medicine

## 2014-02-25 ENCOUNTER — Other Ambulatory Visit (HOSPITAL_COMMUNITY): Payer: Self-pay

## 2014-02-25 MED ORDER — DIGOXIN 125 MCG PO TABS: 0.1250 mg | ORAL_TABLET | Freq: Every day | ORAL | Status: DC

## 2014-02-26 ENCOUNTER — Telehealth (HOSPITAL_COMMUNITY): Payer: Self-pay | Admitting: Vascular Surgery

## 2014-02-26 NOTE — Telephone Encounter (Signed)
Spoke w/pt she states pain management md wants her go start Gabapentin and she wants to make sure that is ok with the meds she is on on, will check with MD and call her back

## 2014-02-26 NOTE — Telephone Encounter (Signed)
Pt called she would like to speak to a provider about a medication her PCP wants her to take .Marland Kitchen Please advise

## 2014-02-26 NOTE — Telephone Encounter (Signed)
Per MD ok to take Gabapentin, pt is aware

## 2014-03-04 ENCOUNTER — Encounter: Payer: Self-pay | Admitting: Internal Medicine

## 2014-03-05 ENCOUNTER — Encounter: Payer: Self-pay | Admitting: Cardiology

## 2014-03-05 ENCOUNTER — Telehealth: Payer: Self-pay | Admitting: Cardiology

## 2014-03-05 NOTE — Telephone Encounter (Signed)
Pt enrolled in ICM clinic. 

## 2014-04-02 ENCOUNTER — Ambulatory Visit (INDEPENDENT_AMBULATORY_CARE_PROVIDER_SITE_OTHER): Payer: 59 | Admitting: *Deleted

## 2014-04-02 DIAGNOSIS — Z95810 Presence of automatic (implantable) cardiac defibrillator: Secondary | ICD-10-CM | POA: Diagnosis not present

## 2014-04-02 DIAGNOSIS — I5022 Chronic systolic (congestive) heart failure: Secondary | ICD-10-CM | POA: Diagnosis not present

## 2014-04-03 NOTE — Progress Notes (Signed)
EPIC Encounter for ICM Monitoring  Patient Name: [name] is a 59 y.o. female Date: 04/03/2014 Primary Care Physican: Glenda Chroman., MD Primary Cardiologist: Branch/ Bensimhon (CHF clinic) Electrophysiologist: Caryl Comes Dry Weight: 167 lbs  Bi-V pacing: 96%       In the past month, have you:  1. Gained more than 2 pounds in a day or more than 5 pounds in a week? no  2. Had changes in your medications (with verification of current medications)? no  3. Had more shortness of breath than is usual for you? No. She will have some intermittent SOB with bending over.   4. Limited your activity because of shortness of breath? no  5. Not been able to sleep because of shortness of breath? no  6. Had increased swelling in your feet or ankles? no  7. Had symptoms of dehydration (dizziness, dry mouth, increased thirst, decreased urine output) no  8. Had changes in sodium restriction? no  9. Been compliant with medication? Yes   ICM trend:   Follow-up plan: ICM clinic phone appointment: This is my first ICM encounter with the patient. Her impedence was down from ~3/11-3/21. She cannot identify any real change in her diet/ symptoms during that time. Her only complaint is feeling very fatigued since the device was implanted 01/12/14. Her BP has been running ~ 120/70. She did notice one episode of swelling a couple of days ago, but this was resolved the following day. The patient is currently taking spironolactone 25 mg 1/2 tablet (12.5 mg) once daily and lasix 40 mg 1/2 tablet (20 mg) once daily. Her most recent EF was 45-50% (01/18/14- echo). She is actually followed by Dr. Haroldine Laws in the CHF clinic. I advised the patient I would forward her information to Dr. Haroldine Laws today, but would no longer make monthly ICM calls to her since she is established in the CHF clinic. She is due to follow back up with CHF now. I will notify staff there to contact her to schedule. She is agreeable.  Copy of note  sent to patient's primary care physician, primary cardiologist, and device following physician.  Alvis Lemmings, RN, BSN 04/03/2014 3:55 PM

## 2014-04-07 ENCOUNTER — Telehealth: Payer: Self-pay | Admitting: Internal Medicine

## 2014-04-07 NOTE — Telephone Encounter (Signed)
New problem   Pt want to know if she can get a tattoo on her lower back . Please advise

## 2014-04-08 NOTE — Telephone Encounter (Signed)
Informed patient will review with Dr. Caryl Comes tomorrow, when he returns to the office.  She verbalized understanding that I will call her tomorrow.

## 2014-04-09 NOTE — Telephone Encounter (Signed)
Advised ok to have tattoo, per Dr. Caryl Comes. She verbalized understanding.

## 2014-04-20 ENCOUNTER — Encounter (HOSPITAL_COMMUNITY): Payer: 59

## 2014-04-28 ENCOUNTER — Encounter: Payer: 59 | Admitting: Internal Medicine

## 2014-05-04 ENCOUNTER — Ambulatory Visit (HOSPITAL_COMMUNITY)
Admission: RE | Admit: 2014-05-04 | Discharge: 2014-05-04 | Disposition: A | Discharge status: Home / Self Care | Payer: 59 | Source: Ambulatory Visit | Attending: Internal Medicine | Admitting: Internal Medicine

## 2014-05-04 VITALS — BP 92/56 | HR 70 | Wt 164.8 lb

## 2014-05-04 DIAGNOSIS — Z87891 Personal history of nicotine dependence: Secondary | ICD-10-CM | POA: Diagnosis not present

## 2014-05-04 DIAGNOSIS — I429 Cardiomyopathy, unspecified: Secondary | ICD-10-CM | POA: Diagnosis not present

## 2014-05-04 DIAGNOSIS — E039 Hypothyroidism, unspecified: Secondary | ICD-10-CM | POA: Insufficient documentation

## 2014-05-04 DIAGNOSIS — Z7982 Long term (current) use of aspirin: Secondary | ICD-10-CM | POA: Insufficient documentation

## 2014-05-04 DIAGNOSIS — G4733 Obstructive sleep apnea (adult) (pediatric): Secondary | ICD-10-CM | POA: Diagnosis not present

## 2014-05-04 DIAGNOSIS — J45909 Unspecified asthma, uncomplicated: Secondary | ICD-10-CM | POA: Insufficient documentation

## 2014-05-04 DIAGNOSIS — I447 Left bundle-branch block, unspecified: Secondary | ICD-10-CM | POA: Diagnosis not present

## 2014-05-04 DIAGNOSIS — Z95810 Presence of automatic (implantable) cardiac defibrillator: Secondary | ICD-10-CM | POA: Diagnosis not present

## 2014-05-04 DIAGNOSIS — I5022 Chronic systolic (congestive) heart failure: Secondary | ICD-10-CM | POA: Insufficient documentation

## 2014-05-04 DIAGNOSIS — E669 Obesity, unspecified: Secondary | ICD-10-CM | POA: Diagnosis not present

## 2014-05-04 DIAGNOSIS — M797 Fibromyalgia: Secondary | ICD-10-CM | POA: Diagnosis not present

## 2014-05-04 DIAGNOSIS — Z79899 Other long term (current) drug therapy: Secondary | ICD-10-CM | POA: Insufficient documentation

## 2014-05-04 LAB — BASIC METABOLIC PANEL
Anion gap: 9 (ref 5–15)
BUN: 23 mg/dL (ref 6–23)
CO2: 28 mmol/L (ref 19–32)
CREATININE: 1.1 mg/dL (ref 0.50–1.10)
Calcium: 10.7 mg/dL — ABNORMAL HIGH (ref 8.4–10.5)
Chloride: 102 mmol/L (ref 96–112)
GFR calc non Af Amer: 55 mL/min — ABNORMAL LOW (ref >90)
GFR, EST AFRICAN AMERICAN: 63 mL/min — AB (ref >90)
GLUCOSE: 86 mg/dL (ref 70–99)
POTASSIUM: 4.8 mmol/L (ref 3.5–5.1)
Sodium: 139 mmol/L (ref 135–145)

## 2014-05-04 LAB — DIGOXIN LEVEL: DIGOXIN LVL: 0.9 ng/mL (ref 0.8–2.0)

## 2014-05-04 LAB — BRAIN NATRIURETIC PEPTIDE: B Natriuretic Peptide: 10.2 pg/mL (ref 0.0–100.0)

## 2014-05-04 NOTE — Patient Instructions (Signed)
Labs today  We will contact you in 3 months to schedule your next appointment.  

## 2014-05-04 NOTE — Progress Notes (Signed)
Patient ID: [name], female   DOB: Oct 14, 1955, 59 y.o.   MRN: [id number] PCP: Dr. Woody Seller [facility]) Primary Cardiologist: Dr. Carlyle Dolly  HPI: [name] is a a 59 yo female with a history of obesity, depression/anxiety, LBBB, hypothyroidism and chronic systolic HF (nonischemic cardiomyopathy).   Admitted for SOB 8/28-09/11/13 and found to have newly diagnosed systolic HF. Underwent R/LHC which showed normal filling pressures and normal coronaries. EF 15-20% on ECHO in 08/2013.  She had a St Jude CRT-D system placed.  She is now using CPAP for OSA.  Repeat echo in 1/16 showed improvement in EF to 45-50%.     Follow up for Heart Failure: [name] is doing pretty well.  Weight is down 14 lbs.  BP is on the low side.  She had a couple of "dizzy spells" in which she got lightheaded while walking.  No palpitations.  The last was last week.  She did not pass out or feels palpitations.  She walks for exercise.  No dyspnea walking around in stores.  Mild dyspnea on flat ground.  No orthopnea or PND.  No chest pain.    Corevue was assessed today.  Thoracic impedance is stable.   Echo 11/25  Markedly dilated LV EF 20-25% with severe dysynchrony. RV normal. ECHO 01/18/2014 EF 45-50%   Labs (1/16): K 4.5, creatinine 1.03  ROS: All systems negative except as listed in HPI, PMH and Problem List.  SH:  History   Social History  . Marital Status: Widowed    Spouse Name: N/A  . Number of Children: N/A  . Years of Education: N/A   Occupational History  . Not on file.   Social History Main Topics  . Smoking status: Former Smoker -- 1.00 packs/day for 16 years    Types: Cigarettes    Start date: 04/22/1972    Quit date: 09/25/1988  . Smokeless tobacco: Never Used     Comment: Quit around 63  . Alcohol Use: Yes     Comment: occasionally  . Drug Use: No  . Sexual Activity: Not on file   Other Topics Concern  . Not on file   Social History Narrative   Lives in [place] by herself and  daughter lives next door. Retired [place]    FH:  Family History  Problem Relation Age of Onset  . Hypertension Mother     deceased: adenocarcinoma, HLD  . Cancer Father     deceased: basal cell carcinoma  . Cancer Sister     breast  . Cancer Brother     skin  . Hyperlipidemia Brother     Past Medical History  Diagnosis Date  . Asthma   . Bronchitis   . Hypothyroidism   . Fibromyalgia   . Depression   . Anxiety   . Chronic systolic HF (heart failure)     a. NICM b. RHC (09/08/13): RA: 2, RV 26/0/2, PA: 25/9 (15), PCWP: 9, Fick CO/CI: 4.6 / 2.5, PA 68% c. ECHO (08/2013) EF 20-25%, grade II DD, RV sys fx mildly reduced, mod TR  . At risk for sudden cardiac death, life vest    . NICM (nonischemic cardiomyopathy)     a. LHC (08/2013): no angiographic evidence of CAD  . Left bundle branch block   . ICD (implantable cardioverter-defibrillator), dual, in situ 01/12/14    St. Jude ; for primary prevention for EF<35%    Current Outpatient Prescriptions  Medication Sig Dispense Refill  . acetaminophen (TYLENOL)  325 MG tablet Take 2 tablets (650 mg total) by mouth every 6 (six) hours as needed for mild pain or moderate pain.    Marland Kitchen aspirin EC 81 MG EC tablet Take 1 tablet (81 mg total) by mouth daily.    . diazepam (VALIUM) 5 MG tablet Take 2.5-5 mg by mouth at bedtime as needed for anxiety.    . diclofenac sodium (VOLTAREN) 1 % GEL Apply 1 application topically daily as needed (fibromyalgia).    Marland Kitchen digoxin (LANOXIN) 0.125 MG tablet Take 1 tablet (0.125 mg total) by mouth daily. 30 tablet 3  . furosemide (LASIX) 40 MG tablet Take 1/2 tablet (20 mg) once daily    . HYDROcodone-acetaminophen (NORCO/VICODIN) 5-325 MG per tablet Take 1 tablet by mouth 2 (two) times daily as needed for moderate pain.     Marland Kitchen levothyroxine (SYNTHROID, LEVOTHROID) 150 MCG tablet Take 150 mcg by mouth daily before breakfast.    . lisinopril (PRINIVIL,ZESTRIL) 2.5 MG tablet Take 1 tablet (2.5 mg total) by mouth at  bedtime. 30 tablet 6  . metoprolol succinate (TOPROL-XL) 25 MG 24 hr tablet Take 0.5 tablets (12.5 mg total) by mouth daily. 30 tablet 6  . polyethylene glycol (MIRALAX / GLYCOLAX) packet Take 17 g by mouth daily as needed for moderate constipation. 14 each 0  . sertraline (ZOLOFT) 100 MG tablet Take 100 mg by mouth daily.    Marland Kitchen spironolactone (ALDACTONE) 25 MG tablet Take 0.5 tablets (12.5 mg total) by mouth daily. 15 tablet 3  . tiZANidine (ZANAFLEX) 4 MG tablet Take 2 mg by mouth 2 (two) times daily.      No current facility-administered medications for this encounter.    Filed Vitals:   05/04/14 1118  BP: 92/56  Pulse: 70  Weight: 164 lb 12 oz (74.73 kg)  SpO2: 97%    PHYSICAL EXAM: General:  Well appearing. No resp difficulty HEENT: normal Neck: supple. JVP flat. Carotids 2+ bilaterally; no bruits. No lymphadenopathy or thryomegaly appreciated. Cor: PMI laterally displaced. Regular rate & rhythm. No rubs, gallops or murmurs.  Lungs: clear Abdomen: soft, nontender, nondistended. No hepatosplenomegaly. No bruits or masses. Good bowel sounds. Extremities: no cyanosis, clubbing, rash, edema Neuro: alert & orientedx3, cranial nerves grossly intact. Moves all 4 extremities w/o difficulty. Affect pleasant.   ASSESSMENT & PLAN:  1) Chronic systolic HF: Nonischemic cardiomypathy, EF 20-25% initially with severe dyssynchrony, RV normal.  She is s/p St Jude CRT-D implantation.  Echo in 1/16 showed EF improved to 45-50%.  NYHA II symptoms and volume status stable (confirmed by Corevue interrogation). Occasional lightheadedness with BP running low today.  - Will continue lasix 20 mg daily. She can hold lasix if she develops dizziness.  - Continue 12.5 mg Toprol XL daily, lisinopril 2.5 mg qhs, spironolactone 12.5 mg daily, and digoxin 0.125 mg daily.  No titration today with soft BP.  I will check BMET, BNP, and digoxin level today.  2) LBBB: S/P St Jude CRT-D  3) Obesity: Weight is coming  down with diet and exercise.   4) OSA:  Continue nightly CPAP.    Followup in 3 months.   05/04/2014 11:23 PM

## 2014-05-14 ENCOUNTER — Encounter: Payer: Self-pay | Admitting: Internal Medicine

## 2014-05-14 ENCOUNTER — Ambulatory Visit (INDEPENDENT_AMBULATORY_CARE_PROVIDER_SITE_OTHER): Payer: 59 | Admitting: Internal Medicine

## 2014-05-14 VITALS — BP 113/65 | HR 63 | Ht 63.0 in | Wt 167.2 lb

## 2014-05-14 DIAGNOSIS — I5022 Chronic systolic (congestive) heart failure: Secondary | ICD-10-CM | POA: Diagnosis not present

## 2014-05-14 DIAGNOSIS — I428 Other cardiomyopathies: Secondary | ICD-10-CM

## 2014-05-14 DIAGNOSIS — I429 Cardiomyopathy, unspecified: Secondary | ICD-10-CM

## 2014-05-14 DIAGNOSIS — Z9189 Other specified personal risk factors, not elsewhere classified: Secondary | ICD-10-CM | POA: Diagnosis not present

## 2014-05-14 DIAGNOSIS — Z4502 Encounter for adjustment and management of automatic implantable cardiac defibrillator: Secondary | ICD-10-CM

## 2014-05-14 LAB — CUP PACEART INCLINIC DEVICE CHECK
Battery Remaining Longevity: 82.8 mo
Brady Statistic RA Percent Paced: 18 %
Date Time Interrogation Session: 20160505102459
HIGH POWER IMPEDANCE MEASURED VALUE: 73.125
HighPow Impedance: 73.125
Lead Channel Impedance Value: 1175 Ohm
Lead Channel Impedance Value: 462.5 Ohm
Lead Channel Impedance Value: 1175 Ohm
Lead Channel Impedance Value: 462.5 Ohm
Lead Channel Impedance Value: 550 Ohm
Lead Channel Pacing Threshold Amplitude: 0.375 V
Lead Channel Pacing Threshold Amplitude: 0.5 V
Lead Channel Pacing Threshold Amplitude: 0.5 V
Lead Channel Pacing Threshold Amplitude: 0.5 V
Lead Channel Pacing Threshold Amplitude: 0.5 V
Lead Channel Pacing Threshold Pulse Width: 0.5 ms
Lead Channel Pacing Threshold Pulse Width: 0.5 ms
Lead Channel Pacing Threshold Pulse Width: 0.5 ms
Lead Channel Pacing Threshold Pulse Width: 0.5 ms
Lead Channel Sensing Intrinsic Amplitude: 1.3 mV
Lead Channel Sensing Intrinsic Amplitude: 11.7 mV
Lead Channel Sensing Intrinsic Amplitude: 1.3 mV
Lead Channel Setting Pacing Amplitude: 1.375
Lead Channel Setting Pacing Amplitude: 2 V
Lead Channel Setting Pacing Amplitude: 2.5 V
Lead Channel Setting Pacing Pulse Width: 0.5 ms
Lead Channel Setting Pacing Pulse Width: 0.5 ms
Lead Channel Setting Pacing Pulse Width: 0.5 ms
Lead Channel Setting Sensing Sensitivity: 0.5 mV
Lead Channel Setting Sensing Sensitivity: 0.5 mV
MDC IDC MSMT BATTERY REMAINING LONGEVITY: 82.8 mo
MDC IDC MSMT LEADCHNL LV PACING THRESHOLD PULSEWIDTH: 0.5 ms
MDC IDC MSMT LEADCHNL RA PACING THRESHOLD AMPLITUDE: 0.375 V
MDC IDC MSMT LEADCHNL RV IMPEDANCE VALUE: 550 Ohm
MDC IDC MSMT LEADCHNL RV PACING THRESHOLD PULSEWIDTH: 0.5 ms
MDC IDC MSMT LEADCHNL RV SENSING INTR AMPL: 11.7 mV
MDC IDC PG SERIAL: 7222096
MDC IDC SESS DTM: 20160505102620
MDC IDC SET LEADCHNL LV PACING AMPLITUDE: 2 V
MDC IDC SET LEADCHNL LV PACING PULSEWIDTH: 0.5 ms
MDC IDC SET LEADCHNL RA PACING AMPLITUDE: 1.375
MDC IDC SET LEADCHNL RV PACING AMPLITUDE: 2.5 V
MDC IDC SET ZONE DETECTION INTERVAL: 250 ms
MDC IDC STAT BRADY RA PERCENT PACED: 18 %
MDC IDC STAT BRADY RV PERCENT PACED: 97 %
MDC IDC STAT BRADY RV PERCENT PACED: 97 %
Pulse Gen Serial Number: 7222096
Zone Setting Detection Interval: 300 ms
Zone Setting Detection Interval: 250 ms
Zone Setting Detection Interval: 300 ms

## 2014-05-14 MED ORDER — LOSARTAN POTASSIUM 25 MG PO TABS: 12.5000 mg | ORAL_TABLET | Freq: Every day | ORAL | Status: DC

## 2014-05-14 MED ORDER — SPIRONOLACTONE 25 MG PO TABS: 12.5000 mg | ORAL_TABLET | Freq: Every day | ORAL | Status: DC

## 2014-05-14 NOTE — Progress Notes (Signed)
Patient Care Team: Glenda Chroman, MD as PCP - General (Internal Medicine)   HPI  [name] is a 59 y.o. female Seen in follow-up for nonischemic cardiomyopathy left bundle branch block for which she underwent CRT-D implantation 1/16  Underwent R/LHC which showed normal filling pressures and normal coronaries. EF 15-20% on ECHO in 08/2013  Post implant echocardiogram demonstrated ejection fraction of 45%. Exercise tolerance is significantly improved although still class II.  She notes in a.m. nonproductive cough, notably she takes her lisinopril at night.  She has some problems with dyspnea; this seems to track with increase in weight she notes abdominal aorta and abdominal fullness. Previously her diuretics were done titrated because of orthostatic lightheadedness.   Past Medical History  Diagnosis Date  . Asthma   . Bronchitis   . Hypothyroidism   . Fibromyalgia   . Depression   . Anxiety   . Chronic systolic HF (heart failure)     a. NICM b. RHC (09/08/13): RA: 2, RV 26/0/2, PA: 25/9 (15), PCWP: 9, Fick CO/CI: 4.6 / 2.5, PA 68% c. ECHO (08/2013) EF 20-25%, grade II DD, RV sys fx mildly reduced, mod TR  . At risk for sudden cardiac death, life vest    . NICM (nonischemic cardiomyopathy)     a. LHC (08/2013): no angiographic evidence of CAD  . Left bundle branch block   . ICD (implantable cardioverter-defibrillator), dual, in situ 01/12/14    St. Jude ; for primary prevention for EF<35%    Past Surgical History  Procedure Laterality Date  . Abdominal hysterectomy    . Shoulder open rotator cuff repair Left   . Laparoscopic gastric banding    . Left and right heart catheterization with coronary angiogram N/A 09/08/2013    Procedure: LEFT AND RIGHT HEART CATHETERIZATION WITH CORONARY ANGIOGRAM;  Surgeon: Burnell Blanks, MD;  Location[facility] CATH LAB;  Service: Cardiovascular;  Laterality: N/A;  . Bi-ventricular implantable cardioverter defibrillator N/A 01/12/2014      Procedure: BI-VENTRICULAR IMPLANTABLE CARDIOVERTER DEFIBRILLATOR  (CRT-D);  Surgeon: Deboraha Sprang, MD;  Location: [facility] CATH LAB;  Service: Cardiovascular;  Laterality: N/A;    Current Outpatient Prescriptions  Medication Sig Dispense Refill  . acetaminophen (TYLENOL) 325 MG tablet Take 2 tablets (650 mg total) by mouth every 6 (six) hours as needed for mild pain or moderate pain.    Marland Kitchen aspirin EC 81 MG EC tablet Take 1 tablet (81 mg total) by mouth daily.    . diazepam (VALIUM) 5 MG tablet Take 2.5-5 mg by mouth at bedtime as needed for anxiety.    . diclofenac sodium (VOLTAREN) 1 % GEL Apply 1 application topically daily as needed (fibromyalgia).    Marland Kitchen digoxin (LANOXIN) 0.125 MG tablet Take 1 tablet (0.125 mg total) by mouth daily. 30 tablet 3  . furosemide (LASIX) 40 MG tablet Take 1/2 tablet (20 mg) once daily    . gabapentin (NEURONTIN) 300 MG capsule Take 300 mg by mouth at bedtime.     Marland Kitchen HYDROcodone-acetaminophen (NORCO/VICODIN) 5-325 MG per tablet Take 1 tablet by mouth 2 (two) times daily as needed for moderate pain.     Marland Kitchen levothyroxine (SYNTHROID, LEVOTHROID) 150 MCG tablet Take 150 mcg by mouth daily before breakfast.    . lisinopril (PRINIVIL,ZESTRIL) 2.5 MG tablet Take 1 tablet (2.5 mg total) by mouth at bedtime. 30 tablet 6  . metoprolol succinate (TOPROL-XL) 25 MG 24 hr tablet Take 0.5 tablets (12.5 mg total)  by mouth daily. 30 tablet 6  . polyethylene glycol (MIRALAX / GLYCOLAX) packet Take 17 g by mouth daily as needed for moderate constipation. 14 each 0  . sertraline (ZOLOFT) 100 MG tablet Take 100 mg by mouth daily.    Marland Kitchen spironolactone (ALDACTONE) 25 MG tablet Take 0.5 tablets (12.5 mg total) by mouth daily. 15 tablet 3  . tiZANidine (ZANAFLEX) 4 MG tablet Take 2 mg by mouth 2 (two) times daily.      No current facility-administered medications for this visit.    Allergies  Allergen Reactions  . Nucynta [Tapentadol] Itching  . Percocet [Oxycodone-Acetaminophen] Itching     Patient can tolerate acetaminophen    Review of Systems negative except from HPI and PMH  Physical Exam BP 113/65 mmHg  Pulse 63  Ht 5\' 3"  (1.6 m)  Wt 167 lb 3.2 oz (75.841 kg)  BMI 29.63 kg/m2 Well developed and well nourished in no acute distress HENT normal E scleral and icterus clear Neck Supple JVP flat; carotids brisk and full Clear to ausculation Device pocket well healed; without hematoma or erythema.  There is no tethering Regular rate and rhythm, no murmurs gallops or rub Soft with active bowel sounds No clubbing cyanosis  Edema Alert and oriented, grossly normal motor and sensory function Skin Warm and Dry  ECG demonstrates AV pacing qrs d  which is decreased to 134 ms from 155 ms not withstanding the unusual activation sequence across the anterior precordium Assessment and  Plan  Nonischemic cardiomyopathy  Left bundle branch block Congestive heart failure-chronic-systolic  Cough question  Lisinopril  CRT-D-St. Jude  The patient's device was interrogated and the information was fully reviewed.  The device was reprogrammed to  maximize longevity   We will switch her lisinopril to losartan to see if it helps the cough.  We have discussed the importance of trying to modulate her activity so as not the patient apprised tomorrow for today's efforts. I've also suggested that she increase her Lasix from 20 daily to 20 daily 5 days a week and 40 mg daily daily for week. She will follow-up in the heart failure clinic.

## 2014-05-14 NOTE — Patient Instructions (Signed)
Medication Instructions:  1) STOP Lisinopril 2) START Losartan 12.5 mg daily at bedtime  Labwork: None ordered  Testing/Procedures: None ordered  Follow-Up: Remote monitoring is used to monitor your Pacemaker of ICD from home. This monitoring reduces the number of office visits required to check your device to one time per year. It allows [name] to keep an eye on the functioning of your device to ensure it is working properly. You are scheduled for a device check from home on 08/13/14. You may send your transmission at any time that day. If you have a wireless device, the transmission will be sent automatically. After your physician reviews your transmission, you will receive a postcard with your next transmission date.  Your physician wants you to follow-up in: 1 year with Dr. Caryl Comes.  You will receive a reminder letter in the mail two months in advance. If you don't receive a letter, please call our office to schedule the follow-up appointment.   Thank you for choosing [facility]!!

## 2014-06-16 ENCOUNTER — Encounter: Payer: Self-pay | Admitting: Internal Medicine

## 2014-06-23 ENCOUNTER — Other Ambulatory Visit (HOSPITAL_COMMUNITY): Payer: Self-pay | Admitting: Anesthesiology

## 2014-07-06 ENCOUNTER — Other Ambulatory Visit (HOSPITAL_COMMUNITY): Payer: Self-pay | Admitting: *Deleted

## 2014-07-06 MED ORDER — DIGOXIN 125 MCG PO TABS: 0.1250 mg | ORAL_TABLET | Freq: Every day | ORAL | Status: DC

## 2014-08-06 ENCOUNTER — Encounter: Payer: Self-pay | Admitting: Cardiology

## 2014-08-07 ENCOUNTER — Telehealth: Payer: Self-pay | Admitting: *Deleted

## 2014-08-07 NOTE — Telephone Encounter (Signed)
Pt says weight has gradually increased 163lbs 3 weeks ago and today 170lbs. Swelling on ankles, pt is taking 40 mg daily (increase from 20 mg) X 2 weeks. Denies CP/dizziness, just weakness and increased sleepiness. Told pt if symtoms worsened over weekend to go to ED and that we would f/u Monday. Made pt appt 8/8 with Dr. Harl Bowie and will forward.

## 2014-08-10 NOTE — Telephone Encounter (Signed)
Pt can come 08/13/14 at 340 and voiced understanding of 60 mg daily of lasix. Will cx 8/8 appt.

## 2014-08-10 NOTE — Telephone Encounter (Signed)
Can she see me Thursday the 4th at the end of the day. Have her increase lasix to 60mg  daily until then  Zandra Abts MD

## 2014-08-13 ENCOUNTER — Ambulatory Visit (INDEPENDENT_AMBULATORY_CARE_PROVIDER_SITE_OTHER): Payer: 59 | Admitting: Cardiology

## 2014-08-13 ENCOUNTER — Encounter: Payer: Self-pay | Admitting: Cardiology

## 2014-08-13 ENCOUNTER — Encounter: Payer: Self-pay | Admitting: Internal Medicine

## 2014-08-13 ENCOUNTER — Ambulatory Visit (INDEPENDENT_AMBULATORY_CARE_PROVIDER_SITE_OTHER): Payer: 59 | Admitting: *Deleted

## 2014-08-13 VITALS — BP 107/67 | HR 77 | Ht 63.0 in | Wt 173.0 lb

## 2014-08-13 DIAGNOSIS — N182 Chronic kidney disease, stage 2 (mild): Secondary | ICD-10-CM

## 2014-08-13 DIAGNOSIS — I5043 Acute on chronic combined systolic (congestive) and diastolic (congestive) heart failure: Secondary | ICD-10-CM | POA: Diagnosis not present

## 2014-08-13 DIAGNOSIS — I429 Cardiomyopathy, unspecified: Secondary | ICD-10-CM

## 2014-08-13 DIAGNOSIS — I428 Other cardiomyopathies: Secondary | ICD-10-CM

## 2014-08-13 MED ORDER — TORSEMIDE 20 MG PO TABS: 20.0000 mg | ORAL_TABLET | Freq: Every day | ORAL | Status: DC

## 2014-08-13 NOTE — Patient Instructions (Signed)
   Stop Lasix.  Begin Torsemide 20mg  daily - new sent to pharmacy today. Continue all other medications.   Lab for BMET, Magnesium - due in 1 week (around 08/20/2014).  Order given today. Office will contact with results via phone or letter.   Follow up with CHF clinic in approximately 2 weeks. Follow up as needed for routine cardiology.

## 2014-08-13 NOTE — Progress Notes (Signed)
Patient ID: TANITH DAGOSTINO, female   DOB: [birth year], 57 y.o.   MRN: [id number]     Clinical Summary Ms. Houpt is a 59 y.o.female seen today for follow up of the following medical problems.   1. Chronic systolic HF/NICM - new diagnosis during  admit 08/2013, LVEF 15-20%, grade II diastolic dysfunction, moderate TR with PASP 57. Post CRT-D placement LVEF increased to 45-50%.  - cath 09/08/13 patent coronaries with normal filling pressures - CRT-D placed Jan 2016 by Dr Caryl Comes - initiated on medical therapy, initially limited due to low blood pressures.  - was started on spironolactone, had severe dizziness and discontinued. ACE-I changed to ARB recently by Dr Caryl Comes due to cough.   - notes increased weight and swelling, fatigue - 1 month ago weight around 163, by home scale up to 172. Notes bilateral LE edema, abdominal distension - compliant with meds. Increased lasix last visit to 60mg  daily, no change.     Past Medical History  Diagnosis Date  . Asthma   . Bronchitis   . Hypothyroidism   . Fibromyalgia   . Depression   . Anxiety   . Chronic systolic HF (heart failure)     a. NICM b. RHC (09/08/13): RA: 2, RV 26/0/2, PA: 25/9 (15), PCWP: 9, Fick CO/CI: 4.6 / 2.5, PA 68% c. ECHO (08/2013) EF 20-25%, grade II DD, RV sys fx mildly reduced, mod TR  . At risk for sudden cardiac death, life vest    . NICM (nonischemic cardiomyopathy)     a. LHC (08/2013): no angiographic evidence of CAD  . Left bundle branch block   . ICD (implantable cardioverter-defibrillator), dual, in situ 01/12/14    St. Jude ; for primary prevention for EF<35%     Allergies  Allergen Reactions  . Nucynta [Tapentadol] Itching  . Percocet [Oxycodone-Acetaminophen] Itching    Patient can tolerate acetaminophen     Current Outpatient Prescriptions  Medication Sig Dispense Refill  . acetaminophen (TYLENOL) 325 MG tablet Take 2 tablets (650 mg total) by mouth every 6 (six) hours as needed for mild pain or moderate  pain.    Marland Kitchen aspirin EC 81 MG EC tablet Take 1 tablet (81 mg total) by mouth daily.    . diazepam (VALIUM) 5 MG tablet Take 2.5-5 mg by mouth at bedtime as needed for anxiety.    . diclofenac sodium (VOLTAREN) 1 % GEL Apply 1 application topically daily as needed (fibromyalgia).    Marland Kitchen digoxin (LANOXIN) 0.125 MG tablet Take 1 tablet (0.125 mg total) by mouth daily. 30 tablet 3  . furosemide (LASIX) 40 MG tablet 60 mg daily. Take 1 1/2 tabs (60 mg) daily 08/10/14    . gabapentin (NEURONTIN) 300 MG capsule Take 300 mg by mouth at bedtime.     Marland Kitchen HYDROcodone-acetaminophen (NORCO/VICODIN) 5-325 MG per tablet Take 1 tablet by mouth 2 (two) times daily as needed for moderate pain.     Marland Kitchen levothyroxine (SYNTHROID, LEVOTHROID) 150 MCG tablet Take 150 mcg by mouth daily before breakfast.    . losartan (COZAAR) 25 MG tablet Take 0.5 tablets (12.5 mg total) by mouth daily. 15 tablet 11  . metoprolol succinate (TOPROL-XL) 25 MG 24 hr tablet Take 0.5 tablets (12.5 mg total) by mouth daily. 30 tablet 6  . polyethylene glycol (MIRALAX / GLYCOLAX) packet Take 17 g by mouth daily as needed for moderate constipation. 14 each 0  . polyethylene glycol powder (GLYCOLAX/MIRALAX) powder TAKE AS DIRECTED 255 g 0  .  sertraline (ZOLOFT) 100 MG tablet Take 100 mg by mouth daily.    Marland Kitchen spironolactone (ALDACTONE) 25 MG tablet Take 0.5 tablets (12.5 mg total) by mouth daily. At bedtime 15 tablet 11  . tiZANidine (ZANAFLEX) 4 MG tablet Take 2 mg by mouth 2 (two) times daily.      No current facility-administered medications for this visit.     Past Surgical History  Procedure Laterality Date  . Abdominal hysterectomy    . Shoulder open rotator cuff repair Left   . Laparoscopic gastric banding    . Left and right heart catheterization with coronary angiogram N/A 09/08/2013    Procedure: LEFT AND RIGHT HEART CATHETERIZATION WITH CORONARY ANGIOGRAM;  Surgeon: Burnell Blanks, MD;  Location: [facility] CATH LAB;  Service:  Cardiovascular;  Laterality: N/A;  . Bi-ventricular implantable cardioverter defibrillator N/A 01/12/2014    Procedure: BI-VENTRICULAR IMPLANTABLE CARDIOVERTER DEFIBRILLATOR  (CRT-D);  Surgeon: Deboraha Sprang, MD;  Location: [facility] CATH LAB;  Service: Cardiovascular;  Laterality: N/A;     Allergies  Allergen Reactions  . Nucynta [Tapentadol] Itching  . Percocet [Oxycodone-Acetaminophen] Itching    Patient can tolerate acetaminophen      Family History  Problem Relation Age of Onset  . Hypertension Mother     deceased: adenocarcinoma, HLD  . Cancer Father     deceased: basal cell carcinoma  . Cancer Sister     breast  . Cancer Brother     skin  . Hyperlipidemia Brother      Social History Ms. Bonsall reports that she quit smoking about 25 years ago. Her smoking use included Cigarettes. She started smoking about 42 years ago. She has a 16 pack-year smoking history. She has never used smokeless tobacco. Ms. Duquette reports that she drinks alcohol.   Review of Systems CONSTITUTIONAL: No weight loss, fever, chills, weakness or fatigue.  HEENT: Eyes: No visual loss, blurred vision, double vision or yellow sclerae.No hearing loss, sneezing, congestion, runny nose or sore throat.  SKIN: No rash or itching.  CARDIOVASCULAR: per HPI RESPIRATORY: No shortness of breath, cough or sputum.  GASTROINTESTINAL: No anorexia, nausea, vomiting or diarrhea. No abdominal pain or blood.  GENITOURINARY: No burning on urination, no polyuria NEUROLOGICAL: No headache, dizziness, syncope, paralysis, ataxia, numbness or tingling in the extremities. No change in bowel or bladder control.  MUSCULOSKELETAL: No muscle, back pain, joint pain or stiffness.  LYMPHATICS: No enlarged nodes. No history of splenectomy.  PSYCHIATRIC: No history of depression or anxiety.  ENDOCRINOLOGIC: No reports of sweating, cold or heat intolerance. No polyuria or polydipsia.  Marland Kitchen   Physical Examination Filed Vitals:   08/13/14  1536  BP: 107/67  Pulse: 77   Filed Vitals:   08/13/14 1536  Height: 5\' 3"  (1.6 m)  Weight: 173 lb (78.472 kg)    Gen: resting comfortably, no acute distress HEENT: no scleral icterus, pupils equal round and reactive, no palptable cervical adenopathy,  CV: RRR, no m/r/g, no JVD Resp: Clear to auscultation bilaterally GI: abdomen is soft, non-tender, non-distended, normal bowel sounds, no hepatosplenomegaly MSK: extremities are warm, 1+ bilatearl LE edema Skin: warm, no rash Neuro:  no focal deficits Psych: appropriate affect   Diagnostic Studies  09/08/13 Cath Hemodynamic Findings:  Ao: 94/66  LV: 93/4/8  RA: 2  RV: 26/0/2  PA: 25/9 (mean 15)  PCWP: 9  Fick Cardiac Output: 4.6 L/min  Fick Cardiac Index: 2.5 L/min/m2  Central Aortic Saturation: 96%  Pulmonary Artery Saturation: 68%  Angiographic Findings:  Left main: No obstructive disease.  Left Anterior Descending Artery: Large caliber vessel that courses to the apex. There are two large caliber diagonal branches. No obstructive disease.  Circumflex Artery: Large caliber vessel with a small caliber first obtuse marginal branch and a large caliber bifurcating second obtuse marginal branch. No obstructive disease.  Right Coronary Artery: Large dominant vessel with no obstructive disease.  Left Ventricular Angiogram: Deferred.  Impression:  1. No angiographic evidence of CAD  2. Normal filling pressures.  3. Non-ischemic cardiomyopathy  Recommendations: Continue medical management of her non-ischemic cardiomyopathy. Continue beta blocker. Would add Ace-inh as BP tolerates.  Complications: None; patient tolerated the procedure well.  08/2013 Echo Study Conclusions  - Left ventricle: There is diffuse hypokinesis with akinesis of the entire anterior, basal and mid anterolateral walls and paradoxical septal motion. The cavity size was severely dilated. Systolic function was severely reduced. The  estimated ejection fraction was in the range of 15-20%. Features are consistent with a pseudonormal left ventricular filling pattern, with concomitant abnormal relaxation and increased filling pressure (grade 2 diastolic dysfunction). Doppler parameters are consistent with elevated ventricular end-diastolic filling pressure. - Aortic valve: Trileaflet; normal thickness leaflets. There was no regurgitation. - Aortic root: The aortic root was normal in size. - Left atrium: The atrium was mildly dilated. - Right ventricle: Systolic function was mildly reduced. - Right atrium: The atrium was normal in size. - Tricuspid valve: There was moderate regurgitation. - Pulmonary arteries: Systolic pressure was moderately to severely increased. PA peak pressure: 57 mm Hg (S).  Impressions:  - Severe dilatation of the left ventricle with severely decreased LVEF 15-20%. Elevated filling pressures. Regional wall motion abnormalities consistent with LBBB and suggestive of infarct in LAD territory. Moderate mitral and tricuspid regurgitation. Mild right ventricular systolic impairement and moderate to severe pulmonary hypertension.   Assessment and Plan   1. Acute on chronic systolic HF - recent weight gain, edema, and SOB - will change lasix to torsemide 20mg  daily. With change will check BMET and Mg. She will resume her f/u with CHF clinic.      Arnoldo Lenis, M.D.

## 2014-08-13 NOTE — Progress Notes (Signed)
Remote ICD transmission.   

## 2014-08-18 ENCOUNTER — Ambulatory Visit: Payer: 59 | Admitting: Cardiology

## 2014-08-20 LAB — CUP PACEART REMOTE DEVICE CHECK
Battery Remaining Longevity: 81 mo
Brady Statistic AP VP Percent: 13 %
Brady Statistic AS VS Percent: 2.2 %
Brady Statistic RA Percent Paced: 14 %
HIGH POWER IMPEDANCE MEASURED VALUE: 73 Ohm
HighPow Impedance: 73 Ohm
Lead Channel Impedance Value: 460 Ohm
Lead Channel Pacing Threshold Amplitude: 0.375 V
Lead Channel Pacing Threshold Pulse Width: 0.5 ms
Lead Channel Pacing Threshold Pulse Width: 0.5 ms
Lead Channel Sensing Intrinsic Amplitude: 2.8 mV
Lead Channel Setting Pacing Amplitude: 1.375
Lead Channel Setting Pacing Amplitude: 2 V
Lead Channel Setting Pacing Amplitude: 2.5 V
Lead Channel Setting Pacing Pulse Width: 0.5 ms
Lead Channel Setting Sensing Sensitivity: 0.5 mV
MDC IDC MSMT BATTERY REMAINING PERCENTAGE: 89 %
MDC IDC MSMT BATTERY VOLTAGE: 3.1 V
MDC IDC MSMT LEADCHNL LV IMPEDANCE VALUE: 1125 Ohm
MDC IDC MSMT LEADCHNL LV PACING THRESHOLD AMPLITUDE: 0.625 V
MDC IDC MSMT LEADCHNL RV IMPEDANCE VALUE: 600 Ohm
MDC IDC MSMT LEADCHNL RV PACING THRESHOLD AMPLITUDE: 0.5 V
MDC IDC MSMT LEADCHNL RV PACING THRESHOLD PULSEWIDTH: 0.5 ms
MDC IDC MSMT LEADCHNL RV SENSING INTR AMPL: 12 mV
MDC IDC SESS DTM: 20160804060018
MDC IDC SET LEADCHNL LV PACING PULSEWIDTH: 0.5 ms
MDC IDC SET ZONE DETECTION INTERVAL: 300 ms
MDC IDC STAT BRADY AP VS PERCENT: 1 %
MDC IDC STAT BRADY AS VP PERCENT: 84 %
Pulse Gen Serial Number: 7222096
Zone Setting Detection Interval: 250 ms

## 2014-08-21 ENCOUNTER — Telehealth: Payer: Self-pay | Admitting: *Deleted

## 2014-08-21 NOTE — Telephone Encounter (Signed)
Pt aware, routed to pcp 

## 2014-08-21 NOTE — Telephone Encounter (Signed)
-----   Message from Arnoldo Lenis, MD sent at 08/21/2014 10:03 AM EDT ----- Labs look good  Zandra Abts MD

## 2014-08-25 ENCOUNTER — Encounter (HOSPITAL_COMMUNITY): Payer: Self-pay

## 2014-08-25 NOTE — Progress Notes (Signed)
Medical record request received by [facility] to [facility]. All records requested (Labs, vitals, appointments, notes, echo, med list) faxed to [phone] attn: Twanna Hy RN CM. Copy of request scanned into electronic medical records.  Renee Pain

## 2014-09-11 ENCOUNTER — Ambulatory Visit (HOSPITAL_COMMUNITY)
Admission: RE | Admit: 2014-09-11 | Discharge: 2014-09-11 | Disposition: A | Discharge status: Home / Self Care | Payer: 59 | Source: Ambulatory Visit | Attending: Internal Medicine | Admitting: Internal Medicine

## 2014-09-11 ENCOUNTER — Encounter (HOSPITAL_COMMUNITY): Payer: Self-pay

## 2014-09-11 VITALS — BP 110/68 | HR 85 | Wt 170.0 lb

## 2014-09-11 DIAGNOSIS — R5382 Chronic fatigue, unspecified: Secondary | ICD-10-CM

## 2014-09-11 DIAGNOSIS — Z7982 Long term (current) use of aspirin: Secondary | ICD-10-CM | POA: Diagnosis not present

## 2014-09-11 DIAGNOSIS — J45909 Unspecified asthma, uncomplicated: Secondary | ICD-10-CM | POA: Insufficient documentation

## 2014-09-11 DIAGNOSIS — F419 Anxiety disorder, unspecified: Secondary | ICD-10-CM | POA: Insufficient documentation

## 2014-09-11 DIAGNOSIS — I429 Cardiomyopathy, unspecified: Secondary | ICD-10-CM | POA: Diagnosis not present

## 2014-09-11 DIAGNOSIS — R5383 Other fatigue: Secondary | ICD-10-CM | POA: Insufficient documentation

## 2014-09-11 DIAGNOSIS — Z95810 Presence of automatic (implantable) cardiac defibrillator: Secondary | ICD-10-CM | POA: Diagnosis not present

## 2014-09-11 DIAGNOSIS — G4730 Sleep apnea, unspecified: Secondary | ICD-10-CM | POA: Diagnosis not present

## 2014-09-11 DIAGNOSIS — G4733 Obstructive sleep apnea (adult) (pediatric): Secondary | ICD-10-CM | POA: Diagnosis not present

## 2014-09-11 DIAGNOSIS — I447 Left bundle-branch block, unspecified: Secondary | ICD-10-CM | POA: Insufficient documentation

## 2014-09-11 DIAGNOSIS — Z8249 Family history of ischemic heart disease and other diseases of the circulatory system: Secondary | ICD-10-CM | POA: Insufficient documentation

## 2014-09-11 DIAGNOSIS — F329 Major depressive disorder, single episode, unspecified: Secondary | ICD-10-CM | POA: Insufficient documentation

## 2014-09-11 DIAGNOSIS — M797 Fibromyalgia: Secondary | ICD-10-CM | POA: Insufficient documentation

## 2014-09-11 DIAGNOSIS — Z87891 Personal history of nicotine dependence: Secondary | ICD-10-CM | POA: Insufficient documentation

## 2014-09-11 DIAGNOSIS — Z79899 Other long term (current) drug therapy: Secondary | ICD-10-CM | POA: Diagnosis not present

## 2014-09-11 DIAGNOSIS — E039 Hypothyroidism, unspecified: Secondary | ICD-10-CM | POA: Insufficient documentation

## 2014-09-11 DIAGNOSIS — I5022 Chronic systolic (congestive) heart failure: Secondary | ICD-10-CM | POA: Diagnosis not present

## 2014-09-11 DIAGNOSIS — E669 Obesity, unspecified: Secondary | ICD-10-CM | POA: Insufficient documentation

## 2014-09-11 DIAGNOSIS — I428 Other cardiomyopathies: Secondary | ICD-10-CM | POA: Diagnosis not present

## 2014-09-11 LAB — COMPREHENSIVE METABOLIC PANEL
ALK PHOS: 79 U/L (ref 38–126)
ALT: 27 U/L (ref 14–54)
ANION GAP: 9 (ref 5–15)
AST: 28 U/L (ref 15–41)
Albumin: 4 g/dL (ref 3.5–5.0)
BUN: 17 mg/dL (ref 6–20)
CALCIUM: 10.4 mg/dL — AB (ref 8.9–10.3)
CO2: 26 mmol/L (ref 22–32)
Chloride: 103 mmol/L (ref 101–111)
Creatinine, Ser: 0.97 mg/dL (ref 0.44–1.00)
GFR calc Af Amer: >60 mL/min (ref >60)
GFR calc non Af Amer: >60 mL/min (ref >60)
Glucose, Bld: 108 mg/dL — ABNORMAL HIGH (ref 65–99)
Potassium: 3.9 mmol/L (ref 3.5–5.1)
Sodium: 138 mmol/L (ref 135–145)
Total Bilirubin: 0.6 mg/dL (ref 0.3–1.2)
Total Protein: 7.4 g/dL (ref 6.5–8.1)

## 2014-09-11 LAB — CBC
HCT: 41.9 % (ref 36.0–46.0)
HEMOGLOBIN: 13.9 g/dL (ref 12.0–15.0)
MCH: 30.3 pg (ref 26.0–34.0)
MCHC: 33.2 g/dL (ref 30.0–36.0)
MCV: 91.3 fL (ref 78.0–100.0)
PLATELETS: 207 10*3/uL (ref 150–400)
RBC: 4.59 MIL/uL (ref 3.87–5.11)
RDW: 13.1 % (ref 11.5–15.5)
WBC: 8.1 10*3/uL (ref 4.0–10.5)

## 2014-09-11 LAB — T4, FREE: Free T4: 1.32 ng/dL — ABNORMAL HIGH (ref 0.61–1.12)

## 2014-09-11 LAB — TSH: TSH: 0.085 u[IU]/mL — ABNORMAL LOW (ref 0.350–4.500)

## 2014-09-11 NOTE — Progress Notes (Signed)
Advanced Heart Failure Medication Review by a Pharmacist  Does the patient  feel that his/her medications are working for him/her?  yes  Has the patient been experiencing any side effects to the medications prescribed?  no  Does the patient measure his/her own blood pressure or blood glucose at home?  no   Does the patient have any problems obtaining medications due to transportation or finances?   no  Understanding of regimen: good Understanding of indications: good Potential of compliance: good    Pharmacist comments:  [name] is a pleasant 59 yo F presenting with her medication bottles. She seems to have a good understanding of her regimen and the importance of consistent use. She does state that recently she has been requiring an additional dose of her torsemide about 1-2 time per week for edema and weight gain (~8 lbs in the last 3-4 weeks). She did not have any other specific medication-related questions or concerns for me today.  [name], PharmD, BCPS, CPP Clinical Pharmacist Pager: [phone] Phone: [phone] 09/11/2014 11:44 AM

## 2014-09-11 NOTE — Progress Notes (Signed)
ADVANCED HF CLINIC NOTE  Patient ID: [name], female   DOB: December 02, 1955, 59 y.o.   MRN: [id number] PCP: Dr. Woody Seller [facility]) Primary Cardiologist: Dr. Carlyle Dolly  HPI: Ms. [name] is a a 59 yo female with a history of obesity, depression/anxiety, LBBB, hypothyroidism and chronic systolic HF (nonischemic cardiomyopathy).   Admitted for SOB 8/28-09/11/13 and found to have newly diagnosed systolic HF. Underwent R/LHC which showed normal filling pressures and normal coronaries. EF 15-20% on ECHO in 08/2013.  She had a St Jude CRT-D system placed.  She is now using CPAP for OSA.  Repeat echo in 1/16 showed improvement in EF to 45-50%.     Follow up for Heart Failure: At last visit no meds changed due to low BP and dizzy spells after losing 14 pounds. Feels ok. Main complaint is sleepiness. Says she can fall asleep any time. Using CPAP. Weight is stable. She walks for exercise. - 2 miles on TM most days. Also doing sit-ups and push-ups. Still with mild dyspnea walking up hills in heat. No orthopnea or PND.  No chest pain.    ICD was assessed today.  Thoracic impedance is stable. No VT/AF  Echo 11/25  Markedly dilated LV EF 20-25% with severe dysynchrony. RV normal. ECHO 01/18/2014 EF 45-50%   Labs (1/16): K 4.5, creatinine 1.03  ROS: All systems negative except as listed in HPI, PMH and Problem List.  SH:  Social History   Social History  . Marital Status: Widowed    Spouse Name: N/A  . Number of Children: N/A  . Years of Education: N/A   Occupational History  . Not on file.   Social History Main Topics  . Smoking status: Former Smoker -- 1.00 packs/day for 16 years    Types: Cigarettes    Start date: 04/22/1972    Quit date: 09/25/1988  . Smokeless tobacco: Never Used     Comment: Quit around 46  . Alcohol Use: 0.0 oz/week    0 Standard drinks or equivalent per week     Comment: occasionally  . Drug Use: No  . Sexual Activity: Not on file   Other Topics Concern  . Not on  file   Social History Narrative   Lives in [place] by herself and daughter lives next door. Retired [place]    FH:  Family History  Problem Relation Age of Onset  . Hypertension Mother     deceased: adenocarcinoma, HLD  . Cancer Father     deceased: basal cell carcinoma  . Cancer Sister     breast  . Cancer Brother     skin  . Hyperlipidemia Brother     Past Medical History  Diagnosis Date  . Asthma   . Bronchitis   . Hypothyroidism   . Fibromyalgia   . Depression   . Anxiety   . Chronic systolic HF (heart failure)     a. NICM b. RHC (09/08/13): RA: 2, RV 26/0/2, PA: 25/9 (15), PCWP: 9, Fick CO/CI: 4.6 / 2.5, PA 68% c. ECHO (08/2013) EF 20-25%, grade II DD, RV sys fx mildly reduced, mod TR  . At risk for sudden cardiac death, life vest    . NICM (nonischemic cardiomyopathy)     a. LHC (08/2013): no angiographic evidence of CAD  . Left bundle branch block   . ICD (implantable cardioverter-defibrillator), dual, in situ 01/12/14    St. Jude ; for primary prevention for EF<35%    Current Outpatient Prescriptions  Medication  Sig Dispense Refill  . acetaminophen (TYLENOL) 325 MG tablet Take 2 tablets (650 mg total) by mouth every 6 (six) hours as needed for mild pain or moderate pain.    Marland Kitchen aspirin EC 81 MG EC tablet Take 1 tablet (81 mg total) by mouth daily.    . cetirizine (ZYRTEC) 10 MG tablet Take 10 mg by mouth daily.    . diazepam (VALIUM) 5 MG tablet Take 2.5-5 mg by mouth at bedtime as needed for anxiety.    . diclofenac sodium (VOLTAREN) 1 % GEL Apply 1 application topically daily as needed (fibromyalgia).    Marland Kitchen digoxin (LANOXIN) 0.125 MG tablet Take 1 tablet (0.125 mg total) by mouth daily. 30 tablet 3  . gabapentin (NEURONTIN) 300 MG capsule Take 600 mg by mouth 2 (two) times daily.     Marland Kitchen HYDROcodone-acetaminophen (NORCO) 7.5-325 MG per tablet Take 1 tablet by mouth 3 (three) times daily as needed for moderate pain.    Marland Kitchen levothyroxine (SYNTHROID, LEVOTHROID) 150 MCG tablet  Take 150 mcg by mouth daily before breakfast.    . losartan (COZAAR) 25 MG tablet Take 0.5 tablets (12.5 mg total) by mouth daily. 15 tablet 11  . metoprolol succinate (TOPROL-XL) 25 MG 24 hr tablet Take 0.5 tablets (12.5 mg total) by mouth daily. 30 tablet 6  . polyethylene glycol (MIRALAX / GLYCOLAX) packet Take 17 g by mouth daily.    . sertraline (ZOLOFT) 100 MG tablet Take 100 mg by mouth daily.    Marland Kitchen spironolactone (ALDACTONE) 25 MG tablet Take 0.5 tablets (12.5 mg total) by mouth daily. At bedtime 15 tablet 11  . tiZANidine (ZANAFLEX) 4 MG tablet Take 2 mg by mouth 2 (two) times daily as needed for muscle spasms (for fibromyalgia).     . torsemide (DEMADEX) 20 MG tablet Take 1 tablet (20 mg total) by mouth daily. 30 tablet 6   No current facility-administered medications for this encounter.    Filed Vitals:   09/11/14 1125  BP: 110/68  Pulse: 85  Weight: 170 lb (77.111 kg)  SpO2: 98%    PHYSICAL EXAM: General:  Well appearing. No resp difficulty HEENT: normal Neck: supple. JVP flat. Carotids 2+ bilaterally; no bruits. No lymphadenopathy or thryomegaly appreciated. Cor: PMI laterally displaced. Regular rate & rhythm. No rubs, gallops or murmurs.  Lungs: clear Abdomen: soft, nontender, nondistended. No hepatosplenomegaly. No bruits or masses. Good bowel sounds. Extremities: no cyanosis, clubbing, rash, edema Neuro: alert & orientedx3, cranial nerves grossly intact. Moves all 4 extremities w/o difficulty. Affect pleasant.   ASSESSMENT & PLAN:  1) Chronic systolic HF: Nonischemic cardiomypathy, EF 20-25% initially with severe dyssynchrony, RV normal.  She is s/p St Jude CRT-D implantation.  Echo in 1/16 showed EF improved to 45-50%.  NYHA II symptoms and volume status stable (confirmed by Corevue interrogation).  - Overall much improved with CRT. Will repeat echo to check for complete recovery of LV function.  - Will continue lasix 20 mg daily. She can hold lasix if she develops  dizziness.  - Continue 12.5 mg Toprol XL daily, lisinopril 2.5 mg qhs, spironolactone 12.5 mg daily, and digoxin 0.125 mg daily.  No titration today with fatigue and soft BP. Check labs. Can likely stop digoxin at next visit if EF maintained.  2) LBBB: S/P St Jude CRT-D  3) Obesity: Weight stable. Continue diet and exercise.  4) OSA:  Continue nightly CPAP.   5) Fatigue:  Suspect due to incompletely treated OSA. Will refer to Dr. Radford Pax  for repeat sleep eval. Check labs including TFTs.   F/u in 4 weeks with echo. If EF stable can d/c from HF Clinic to f/u with Dr. Harl Bowie.   Bensimhon, Daniel,MD 1:40 PM

## 2014-09-11 NOTE — Patient Instructions (Signed)
Labs today  You have been referred to [facility] for evaluation of sleep apnea   Your physician recommends that you schedule a follow-up appointment in: 3 months with a echocardiogram  Your physician has requested that you have an echocardiogram. Echocardiography is a painless test that uses sound waves to create images of your heart. It provides your doctor with information about the size and shape of your heart and how well your heart's chambers and valves are working. This procedure takes approximately one hour. There are no restrictions for this procedure.  Do the following things EVERYDAY: 1) Weigh yourself in the morning before breakfast. Write it down and keep it in a log. 2) Take your medicines as prescribed 3) Eat low salt foods-Limit salt (sodium) to 2000 mg per day.  4) Stay as active as you can everyday 5) Limit all fluids for the day to less than 2 liters 6)

## 2014-09-15 ENCOUNTER — Other Ambulatory Visit (HOSPITAL_COMMUNITY): Payer: Self-pay | Admitting: *Deleted

## 2014-09-15 MED ORDER — LEVOTHYROXINE SODIUM 100 MCG PO TABS: 100.0000 ug | ORAL_TABLET | Freq: Every day | ORAL | Status: DC

## 2014-09-18 ENCOUNTER — Encounter: Payer: Self-pay | Admitting: Cardiology

## 2014-10-03 ENCOUNTER — Ambulatory Visit (INDEPENDENT_AMBULATORY_CARE_PROVIDER_SITE_OTHER): Payer: 59

## 2014-10-03 DIAGNOSIS — Z23 Encounter for immunization: Secondary | ICD-10-CM

## 2014-10-13 ENCOUNTER — Encounter: Payer: Self-pay | Admitting: Internal Medicine

## 2014-11-03 ENCOUNTER — Other Ambulatory Visit: Payer: Self-pay | Admitting: *Deleted

## 2014-11-03 MED ORDER — METOPROLOL SUCCINATE ER 25 MG PO TB24: 12.5000 mg | ORAL_TABLET | Freq: Every day | ORAL | Status: DC

## 2014-11-04 ENCOUNTER — Other Ambulatory Visit: Payer: Self-pay | Admitting: *Deleted

## 2014-11-04 DIAGNOSIS — Z4502 Encounter for adjustment and management of automatic implantable cardiac defibrillator: Secondary | ICD-10-CM

## 2014-11-04 DIAGNOSIS — Z9189 Other specified personal risk factors, not elsewhere classified: Secondary | ICD-10-CM

## 2014-11-04 DIAGNOSIS — I428 Other cardiomyopathies: Secondary | ICD-10-CM

## 2014-11-04 DIAGNOSIS — I5022 Chronic systolic (congestive) heart failure: Secondary | ICD-10-CM

## 2014-11-04 MED ORDER — LOSARTAN POTASSIUM 25 MG PO TABS: 12.5000 mg | ORAL_TABLET | Freq: Every day | ORAL | Status: DC

## 2014-11-04 MED ORDER — SPIRONOLACTONE 25 MG PO TABS: 12.5000 mg | ORAL_TABLET | Freq: Every day | ORAL | Status: DC

## 2014-11-04 NOTE — Telephone Encounter (Signed)
verified pharmacy with pt, she wants to use [facility], called [facility] per [name] request and had the Losartan & Spironolactone cancelled, will send in new RX to [facility] for her.

## 2014-11-06 ENCOUNTER — Telehealth: Payer: Self-pay | Admitting: *Deleted

## 2014-11-06 ENCOUNTER — Encounter: Payer: Self-pay | Admitting: Internal Medicine

## 2014-11-06 NOTE — Telephone Encounter (Signed)
called to inform pt they have refills at [facility], she will need to have [facility] call and request the refill on Torsemide to be moved to [facility], pt expressed understanding

## 2014-11-16 ENCOUNTER — Ambulatory Visit (INDEPENDENT_AMBULATORY_CARE_PROVIDER_SITE_OTHER): Payer: 59 | Admitting: *Deleted

## 2014-11-16 ENCOUNTER — Telehealth: Payer: Self-pay | Admitting: Cardiology

## 2014-11-16 DIAGNOSIS — I429 Cardiomyopathy, unspecified: Secondary | ICD-10-CM

## 2014-11-16 DIAGNOSIS — I5022 Chronic systolic (congestive) heart failure: Secondary | ICD-10-CM | POA: Diagnosis not present

## 2014-11-16 DIAGNOSIS — I428 Other cardiomyopathies: Secondary | ICD-10-CM

## 2014-11-16 NOTE — Telephone Encounter (Signed)
Spoke with pt and reminded pt of remote transmission that is due today. Pt verbalized understanding.   

## 2014-11-17 ENCOUNTER — Encounter: Payer: Self-pay | Admitting: Cardiology

## 2014-11-19 LAB — CUP PACEART REMOTE DEVICE CHECK
Brady Statistic AP VP Percent: 20 %
Brady Statistic AP VS Percent: 1 %
Brady Statistic AS VP Percent: 78 %
Brady Statistic AS VS Percent: 1.2 %
Date Time Interrogation Session: 20161108150821
HIGH POWER IMPEDANCE MEASURED VALUE: 74 Ohm
HighPow Impedance: 74 Ohm
Implantable Lead Implant Date: 20160104
Implantable Lead Implant Date: 20160104
Implantable Lead Location: 753858
Implantable Lead Location: 753859
Implantable Lead Location: 753860
Lead Channel Impedance Value: 610 Ohm
Lead Channel Setting Pacing Amplitude: 2 V
Lead Channel Setting Pacing Amplitude: 2.5 V
Lead Channel Setting Pacing Pulse Width: 0.5 ms
Lead Channel Setting Sensing Sensitivity: 0.5 mV
MDC IDC LEAD IMPLANT DT: 20160104
MDC IDC LEAD MODEL: 7122
MDC IDC MSMT BATTERY REMAINING LONGEVITY: 79 mo
MDC IDC MSMT BATTERY REMAINING PERCENTAGE: 86 %
MDC IDC MSMT BATTERY VOLTAGE: 3.04 V
MDC IDC MSMT LEADCHNL LV IMPEDANCE VALUE: 1025 Ohm
MDC IDC MSMT LEADCHNL RA IMPEDANCE VALUE: 530 Ohm
MDC IDC SET LEADCHNL LV PACING PULSEWIDTH: 0.5 ms
MDC IDC SET LEADCHNL RA PACING AMPLITUDE: 1.5 V
MDC IDC STAT BRADY RA PERCENT PACED: 21 %
Pulse Gen Serial Number: 7222096

## 2014-11-19 NOTE — Progress Notes (Signed)
Remote ICD transmission.   

## 2014-11-20 ENCOUNTER — Encounter: Payer: Self-pay | Admitting: Cardiology

## 2014-12-01 ENCOUNTER — Ambulatory Visit (HOSPITAL_BASED_OUTPATIENT_CLINIC_OR_DEPARTMENT_OTHER): Payer: 59

## 2014-12-10 ENCOUNTER — Telehealth (HOSPITAL_COMMUNITY): Payer: Self-pay

## 2014-12-10 MED ORDER — DIGOXIN 125 MCG PO TABS: 0.1250 mg | ORAL_TABLET | Freq: Every day | ORAL | Status: DC

## 2014-12-10 NOTE — Telephone Encounter (Signed)
Refill for digoxin 0.125 mg # 90 three refills sent to "[facility] [place], [place]

## 2014-12-25 ENCOUNTER — Ambulatory Visit (HOSPITAL_COMMUNITY)
Admission: RE | Admit: 2014-12-25 | Discharge: 2014-12-25 | Disposition: A | Discharge status: Home / Self Care | Payer: 59 | Source: Ambulatory Visit | Attending: Internal Medicine | Admitting: Internal Medicine

## 2014-12-25 ENCOUNTER — Ambulatory Visit (HOSPITAL_BASED_OUTPATIENT_CLINIC_OR_DEPARTMENT_OTHER)
Admission: RE | Admit: 2014-12-25 | Discharge: 2014-12-25 | Disposition: A | Discharge status: Home / Self Care | Payer: 59 | Source: Ambulatory Visit | Attending: Internal Medicine | Admitting: Internal Medicine

## 2014-12-25 VITALS — BP 102/64 | HR 58 | Wt 169.0 lb

## 2014-12-25 DIAGNOSIS — N182 Chronic kidney disease, stage 2 (mild): Secondary | ICD-10-CM

## 2014-12-25 DIAGNOSIS — I5022 Chronic systolic (congestive) heart failure: Secondary | ICD-10-CM | POA: Diagnosis not present

## 2014-12-25 DIAGNOSIS — Z95810 Presence of automatic (implantable) cardiac defibrillator: Secondary | ICD-10-CM | POA: Insufficient documentation

## 2014-12-25 DIAGNOSIS — I5043 Acute on chronic combined systolic (congestive) and diastolic (congestive) heart failure: Secondary | ICD-10-CM | POA: Diagnosis not present

## 2014-12-25 DIAGNOSIS — I340 Nonrheumatic mitral (valve) insufficiency: Secondary | ICD-10-CM | POA: Diagnosis not present

## 2014-12-25 DIAGNOSIS — I5189 Other ill-defined heart diseases: Secondary | ICD-10-CM | POA: Insufficient documentation

## 2014-12-25 DIAGNOSIS — Z9989 Dependence on other enabling machines and devices: Principal | ICD-10-CM

## 2014-12-25 DIAGNOSIS — I351 Nonrheumatic aortic (valve) insufficiency: Secondary | ICD-10-CM | POA: Insufficient documentation

## 2014-12-25 DIAGNOSIS — I509 Heart failure, unspecified: Secondary | ICD-10-CM | POA: Diagnosis present

## 2014-12-25 DIAGNOSIS — I071 Rheumatic tricuspid insufficiency: Secondary | ICD-10-CM | POA: Insufficient documentation

## 2014-12-25 DIAGNOSIS — G4733 Obstructive sleep apnea (adult) (pediatric): Secondary | ICD-10-CM | POA: Diagnosis not present

## 2014-12-25 MED ORDER — TORSEMIDE 20 MG PO TABS: 20.0000 mg | ORAL_TABLET | ORAL | Status: DC | PRN

## 2014-12-25 NOTE — Progress Notes (Signed)
  Echocardiogram 2D Echocardiogram has been performed.  [name] 12/25/2014, 10:11 AM

## 2014-12-25 NOTE — Progress Notes (Signed)
ADVANCED HF CLINIC NOTE  Patient ID: [name], female   DOB: 1955/09/29, 59 y.o.   MRN: [id number] PCP: Dr. Woody Seller [facility]) Primary Cardiologist: Dr. Carlyle Dolly  HPI: [name] is a a 59 yo female with a history of obesity, depression/anxiety, LBBB, hypothyroidism and chronic systolic HF (nonischemic cardiomyopathy).   Admitted for SOB 8/28-09/11/13 and found to have newly diagnosed systolic HF. Underwent R/LHC which showed normal filling pressures and normal coronaries. EF 15-20% on ECHO in 08/2013.  She had a St Jude CRT-D system placed.  She is now using CPAP for OSA.  Repeat echo in 1/16 showed improvement in EF to 45-50%.     Follow up for Heart Failure: Returns for f/u. Remains tired at times. Fatigued and SOB walking up hills. Using CPAP. Is pending repeat study on 02/23/15. Weight 165-167. Down from previous. She walks for exercise. - 2 miles on TM most days. Walking 3.0-3.5 mph no incline. No orthopnea or PND.  No chest pain.    ICD was assessed today.  Thoracic impedance is stable. No VT/AF  Echo 11/25  Markedly dilated LV EF 20-25% with severe dysynchrony. RV normal. ECHO 01/18/2014 EF 45-50%   Echo today reviewed personally 55-60%. RV normal  Labs (1/16): K 4.5, creatinine 1.03  ROS: All systems negative except as listed in HPI, PMH and Problem List.  SH:  Social History   Social History  . Marital Status: Widowed    Spouse Name: N/A  . Number of Children: N/A  . Years of Education: N/A   Occupational History  . Not on file.   Social History Main Topics  . Smoking status: Former Smoker -- 1.00 packs/day for 16 years    Types: Cigarettes    Start date: 04/22/1972    Quit date: 09/25/1988  . Smokeless tobacco: Never Used     Comment: Quit around 49  . Alcohol Use: 0.0 oz/week    0 Standard drinks or equivalent per week     Comment: occasionally  . Drug Use: No  . Sexual Activity: Not on file   Other Topics Concern  . Not on file   Social History  Narrative   Lives in [place] by herself and daughter lives next door. Retired [place]    FH:  Family History  Problem Relation Age of Onset  . Hypertension Mother     deceased: adenocarcinoma, HLD  . Cancer Father     deceased: basal cell carcinoma  . Cancer Sister     breast  . Cancer Brother     skin  . Hyperlipidemia Brother     Past Medical History  Diagnosis Date  . Asthma   . Bronchitis   . Hypothyroidism   . Fibromyalgia   . Depression   . Anxiety   . Chronic systolic HF (heart failure)     a. NICM b. RHC (09/08/13): RA: 2, RV 26/0/2, PA: 25/9 (15), PCWP: 9, Fick CO/CI: 4.6 / 2.5, PA 68% c. ECHO (08/2013) EF 20-25%, grade II DD, RV sys fx mildly reduced, mod TR  . At risk for sudden cardiac death, life vest    . NICM (nonischemic cardiomyopathy)     a. LHC (08/2013): no angiographic evidence of CAD  . Left bundle branch block   . ICD (implantable cardioverter-defibrillator), dual, in situ 01/12/14    St. Jude ; for primary prevention for EF<35%    Current Outpatient Prescriptions  Medication Sig Dispense Refill  . acetaminophen (TYLENOL) 325 MG tablet  Take 2 tablets (650 mg total) by mouth every 6 (six) hours as needed for mild pain or moderate pain.    Marland Kitchen aspirin EC 81 MG EC tablet Take 1 tablet (81 mg total) by mouth daily.    . cetirizine (ZYRTEC) 10 MG tablet Take 10 mg by mouth daily.    . diazepam (VALIUM) 5 MG tablet Take 2.5-5 mg by mouth at bedtime as needed for anxiety.    . diclofenac sodium (VOLTAREN) 1 % GEL Apply 1 application topically daily as needed (fibromyalgia).    Marland Kitchen digoxin (LANOXIN) 0.125 MG tablet Take 1 tablet (0.125 mg total) by mouth daily. 90 tablet 3  . gabapentin (NEURONTIN) 300 MG capsule Take 600 mg by mouth 2 (two) times daily.     Marland Kitchen HYDROcodone-acetaminophen (NORCO) 7.5-325 MG per tablet Take 1 tablet by mouth 3 (three) times daily as needed for moderate pain.    Marland Kitchen levothyroxine (SYNTHROID, LEVOTHROID) 100 MCG tablet Take 1 tablet (100 mcg  total) by mouth daily before breakfast. 30 tablet 3  . losartan (COZAAR) 25 MG tablet Take 0.5 tablets (12.5 mg total) by mouth daily. 45 tablet 3  . metoprolol succinate (TOPROL-XL) 25 MG 24 hr tablet Take 0.5 tablets (12.5 mg total) by mouth daily. 45 tablet 3  . polyethylene glycol (MIRALAX / GLYCOLAX) packet Take 17 g by mouth daily.    . sertraline (ZOLOFT) 100 MG tablet Take 100 mg by mouth daily.    Marland Kitchen spironolactone (ALDACTONE) 25 MG tablet Take 0.5 tablets (12.5 mg total) by mouth daily. At bedtime 45 tablet 3  . tiZANidine (ZANAFLEX) 4 MG tablet Take 2 mg by mouth 2 (two) times daily as needed for muscle spasms (for fibromyalgia).     . torsemide (DEMADEX) 20 MG tablet Take 1 tablet (20 mg total) by mouth daily. 30 tablet 6   No current facility-administered medications for this encounter.    Filed Vitals:   12/25/14 1016  BP: 102/64  Pulse: 58  Weight: 169 lb (76.658 kg)  SpO2: 95%    PHYSICAL EXAM: General:  Well appearing. No resp difficulty HEENT: normal Neck: supple. JVP flat. Carotids 2+ bilaterally; no bruits. No lymphadenopathy or thryomegaly appreciated. Cor: PMI normal. Regular rate & rhythm. No rubs, gallops or murmurs.  Lungs: clear Abdomen: soft, nontender, nondistended. No hepatosplenomegaly. No bruits or masses. Good bowel sounds. Extremities: no cyanosis, clubbing, rash, edema Neuro: alert & orientedx3, cranial nerves grossly intact. Moves all 4 extremities w/o difficulty. Affect pleasant.   ASSESSMENT & PLAN:  1) Chronic systolic HF: Nonischemic cardiomypathy, EF 20-25% initially with severe dyssynchrony, RV normal.  She is s/p St Jude CRT-D implantation.  Echo  Today shows normal EF.  -  NYHA II symptoms and volume status stable (confirmed by Corevue interrogation).  - Overall much improved with CRT. - Change torsemide to 20 mg as needed - Continue 12.5 mg Toprol XL daily, lisinopril 2.5 mg qhs, spironolactone 12.5 mg daily - Stop digoxin - Encouraged  to continue exercise program and add some incline for her training.  2) LBBB: S/P St Jude CRT-D  3) Obesity: Weight stable. Continue diet and exercise.  4) OSA:  Continue nightly CPAP.   5) Fatigue:  Suspect due to incompletely treated OSA. Pending repeat sleep eval.    EF stable can d/c from HF Clinic to f/u with Dr. Harl Bowie.   Bensimhon, Daniel,MD 10:37 AM

## 2014-12-25 NOTE — Addendum Note (Signed)
Encounter addended by: Kerry Dory, CMA on: 12/25/2014 10:56 AM<BR>     Documentation filed: Medications, Visit Diagnoses, Dx Association, Patient Instructions Section, Orders

## 2014-12-25 NOTE — Progress Notes (Signed)
ERROR

## 2014-12-25 NOTE — Patient Instructions (Signed)
CHANGE Torsemide 20 mg, one tab as needed STOP Digoxin   [facility] Harl [facility]

## 2015-01-13 ENCOUNTER — Other Ambulatory Visit (HOSPITAL_COMMUNITY): Payer: Self-pay | Admitting: Internal Medicine

## 2015-02-08 ENCOUNTER — Telehealth: Payer: Self-pay | Admitting: Internal Medicine

## 2015-02-08 NOTE — Telephone Encounter (Signed)
Informed patient that she would be able to sit on the motorcycle, but she should keep her device at least 12 inches away from the motor. Patient voiced understanding.

## 2015-02-08 NOTE — Telephone Encounter (Signed)
Pt called with a question of whether or not she can operate a motocycle.   Please give her a call back.

## 2015-02-23 ENCOUNTER — Ambulatory Visit (HOSPITAL_BASED_OUTPATIENT_CLINIC_OR_DEPARTMENT_OTHER): Payer: 59 | Attending: Internal Medicine

## 2015-02-23 VITALS — Ht 63.0 in | Wt 168.0 lb

## 2015-02-23 DIAGNOSIS — R0683 Snoring: Secondary | ICD-10-CM | POA: Insufficient documentation

## 2015-02-23 DIAGNOSIS — G4730 Sleep apnea, unspecified: Secondary | ICD-10-CM | POA: Insufficient documentation

## 2015-02-23 DIAGNOSIS — G4733 Obstructive sleep apnea (adult) (pediatric): Secondary | ICD-10-CM | POA: Diagnosis not present

## 2015-02-23 DIAGNOSIS — Z79899 Other long term (current) drug therapy: Secondary | ICD-10-CM | POA: Diagnosis not present

## 2015-02-23 DIAGNOSIS — G4719 Other hypersomnia: Secondary | ICD-10-CM | POA: Diagnosis not present

## 2015-02-24 ENCOUNTER — Telehealth: Payer: Self-pay | Admitting: Cardiology

## 2015-02-24 DIAGNOSIS — G4719 Other hypersomnia: Secondary | ICD-10-CM | POA: Insufficient documentation

## 2015-02-24 NOTE — Telephone Encounter (Signed)
Please let patient know that sleep study showed no significant sleep apnea.    

## 2015-02-24 NOTE — Sleep Study (Signed)
   Patient Name: [name], [name] MRN: [id number] Study Date: 02/23/2015 Gender: Female D.O.B: August 04, 1955 Age (years): 57 Referring Provider: Shaune Pascal Bensimhon Interpreting Physician: Fransico Him MD, ABSM RPSGT: Jonna Coup  Weight (lbs): 168 BMI: 30 Height (inches): 63 Neck Size: 13.50  CLINICAL INFORMATION Sleep Study Type: NPSG Indication for sleep study: N/A Epworth Sleepiness Score: 15  SLEEP STUDY TECHNIQUE As per the AASM Manual for the Scoring of Sleep and Associated Events v2.3 (April 2016) with a hypopnea requiring 4% desaturations. The channels recorded and monitored were frontal, central and occipital EEG, electrooculogram (EOG), submentalis EMG (chin), nasal and oral airflow, thoracic and abdominal wall motion, anterior tibialis EMG, snore microphone, electrocardiogram, and pulse oximetry.  MEDICATIONS Patient's medications include: ASA, Zyrtec, Valium, Voltaren gel, Gabapentin, Norco, Synthroid, Losartan, Toprol, Miralax, Zoloft, Spironolactone, Tizanidine, Torsemide. Medications self-administered by patient during sleep study : No sleep medicine administered.  SLEEP ARCHITECTURE The study was initiated at 10:45:07 PM and ended at 4:57:03 AM. Sleep onset time was 12.6 minutes and the sleep efficiency was reduced at 74.2%. The total sleep time was 275.8 minutes. Stage REM latency was markedly prolonged at 316.5 minutes. The patient spent 2.72% of the night in stage N1 sleep, 95.47% in stage N2 sleep, 0.00% in stage N3 and 1.81% in REM. Alpha intrusion was absent. Supine sleep was 50.57%.  RESPIRATORY PARAMETERS The overall apnea/hypopnea index (AHI) was 2.2 per hour. There were 1 total apneas, including 1 obstructive, 0 central and 0 mixed apneas. There were 9 hypopneas and 6 RERAs. The AHI during Stage REM sleep was 12.0 per hour. AHI while supine was 4.3 per hour. The mean oxygen saturation was 93.47%. The minimum SpO2 during sleep was 87.00%.  Total time  spent with SpO2 < 88% was 0.2 minutes. Moderate snoring was noted during this study.  CARDIAC DATA The 2 lead EKG demonstrated sinus rhythm. The mean heart rate was 60.65 beats per minute. Other EKG findings include: None.  LEG MOVEMENT DATA The total PLMS were 0 with a resulting PLMS index of 0.00. Associated arousal with leg movement index was 0.0 .  IMPRESSIONS - No significant obstructive sleep apnea occurred during this study (AHI = 2.2/h). - No significant central sleep apnea occurred during this study (CAI = 0.0/h). - Mild oxygen desaturation was noted during this study (Min O2 = 87.00%).  Total time spent with SpO2 < 88% was 0.2 minutes. - The patient snored with Moderate snoring volume. - No cardiac abnormalities were noted during this study. - Clinically significant periodic limb movements did not occur during sleep. No significant associated arousals.  DIAGNOSIS - Excessive daytime sleepiness  RECOMMENDATIONS - Avoid alcohol, sedatives and other CNS depressants that may cause sleep apnea and disrupt normal sleep architecture. - Sleep hygiene should be reviewed to assess factors that may improve sleep quality. - Weight management and regular exercise should be initiated or continued if appropriate.   Sueanne Margarita Diplomate, American Board of Sleep Medicine  ELECTRONICALLY SIGNED ON:  02/24/2015, 10:53 AM [place] PH: [phone]   FX: [phone] [place]

## 2015-02-25 ENCOUNTER — Telehealth: Payer: Self-pay | Admitting: *Deleted

## 2015-02-25 NOTE — Telephone Encounter (Signed)
Left message for patient to call back  

## 2015-02-25 NOTE — Telephone Encounter (Signed)
No CPAP indicated at this time.  If her daytime sleepiness is significant could set up for MSLT study to rule out narcolopsy.  She takes multiple drugs that can cause residual daytime sleepiness so question whether that is the problem.

## 2015-02-25 NOTE — Telephone Encounter (Signed)
Spoke to Dr.Klein about pt's question about operating her motorcycle. Dr.Klein states that it would be fine for her to do this. I called patient and informed her of this and also the current recommendations for safety, which is to keep ICD 12 inches away from ignition. Patient voiced understanding.

## 2015-02-25 NOTE — Telephone Encounter (Signed)
Patient stated that she is currently on a machine that she has had for over a year (8cmH20) - since this sleep study was negative for apnea she is wanting to verify with you that according to this sleep study she can discontinue use of that machine (which she would love to do).

## 2015-02-25 NOTE — Telephone Encounter (Signed)
Patient informed of results.  She stated understanding. She said at this time she does not feel like she needs further testing as her daytime sleepiness has gotten better since her heart condition has been treated.  I let her know if she had any issues to call me back to discuss.  Stated understanding.

## 2015-05-06 ENCOUNTER — Encounter: Payer: 59 | Admitting: Internal Medicine

## 2015-06-11 ENCOUNTER — Encounter: Payer: Self-pay | Admitting: *Deleted

## 2015-06-11 ENCOUNTER — Telehealth: Payer: Self-pay | Admitting: Internal Medicine

## 2015-06-11 NOTE — Telephone Encounter (Signed)
New message    The pt has a stop on the foot, the pt states she feels like it is from her Fibromyalgia cause it is like a burn    Pt c/o swelling: STAT is pt has developed SOB within 24 hours  1. How long have you been experiencing swelling? For about week  2. Where is the swelling located? Both legs  3.  Are you currently taking a "fluid pill"? Yes the pt is taking an extra pill every other day  4.  Are you currently SOB? no  5.  Have you traveled recently? The pt is in [place] right now     The pt wants to make sure she does  not need to go a doctor in [place] or maybe urgent care

## 2015-06-11 NOTE — Telephone Encounter (Signed)
I called and spoke with the patient. She has been in [place] over the last 3 weeks and is currently still there for the time being. She reports that she started to have some lower extremity swelling (left > right) just prior to leaving. She continues with left >right LE swelling (mainly to the foot/ lower leg). She reports intermittent periods of purlish/ bluish discoloration to the left leg. She states she had a mosquito bite ~ 3 months ago and ended up seeing a dermatologist for this. They informed her at the time that she was having blood flow issue. She states that she recently wore sandals and the right foot is painful as if she has a blister, but there is no visible signs of this or any injury to her foot. She has been taking torsemide once daily (has been PRN) with an occasional 2nd tablet daily. I advised her that I am unsure as to the cause of her swelling. I advised her that I am uncertain how to tell her how to take her diuretics without follow up labs. I have advised her to have evaluation at an Urgent Care just to see if they can pinpoint the cause of her symptoms and maybe do repeat labs for her. She is agreeable.

## 2015-06-21 ENCOUNTER — Encounter: Payer: 59 | Admitting: Internal Medicine

## 2015-08-06 ENCOUNTER — Telehealth: Payer: Self-pay | Admitting: Internal Medicine

## 2015-08-06 NOTE — Telephone Encounter (Signed)
New message         The pt is calling concern swelling    Pt c/o swelling: STAT is pt has developed SOB within 24 hours  1. How long have you been experiencing swelling? In the last 2 mths  2. Where is the swelling located? In her feet and legs and face  3.  Are you currently taking a "fluid pill"?yes took 2 this am  4.  Are you currently SOB? little  5.  Have you traveled recent? No   In the last 2 mths she has put on 10 pounds the pt states this is just getting out of control

## 2015-08-06 NOTE — Telephone Encounter (Signed)
SPOKE WITH PT  HAS  NOTED  10 LB WEIGHT  GAIN OVER  THE  LAST 2 MONTHS   HAS  ALSO BEEN TAKING TORSEMIDE  20 MG  ROUTINELY WITH LITTLE RELIEF DID STATE  HAD  TAKEN  2 TABS TODAY  WILL FORWARD TO  DR  Haroldine Laws FOR REVIEW .Adonis Housekeeper

## 2015-08-15 ENCOUNTER — Other Ambulatory Visit: Payer: Self-pay | Admitting: Internal Medicine

## 2015-08-15 DIAGNOSIS — Z4502 Encounter for adjustment and management of automatic implantable cardiac defibrillator: Secondary | ICD-10-CM

## 2015-08-15 DIAGNOSIS — I5022 Chronic systolic (congestive) heart failure: Secondary | ICD-10-CM

## 2015-08-15 DIAGNOSIS — I428 Other cardiomyopathies: Secondary | ICD-10-CM

## 2015-08-15 DIAGNOSIS — Z9189 Other specified personal risk factors, not elsewhere classified: Secondary | ICD-10-CM

## 2015-08-26 ENCOUNTER — Encounter: Payer: Self-pay | Admitting: Internal Medicine

## 2015-09-09 ENCOUNTER — Encounter: Payer: Self-pay | Admitting: Internal Medicine

## 2015-09-09 ENCOUNTER — Ambulatory Visit (INDEPENDENT_AMBULATORY_CARE_PROVIDER_SITE_OTHER): Payer: 59 | Admitting: Internal Medicine

## 2015-09-09 VITALS — BP 90/62 | HR 60 | Ht 63.0 in | Wt 183.6 lb

## 2015-09-09 DIAGNOSIS — I5022 Chronic systolic (congestive) heart failure: Secondary | ICD-10-CM

## 2015-09-09 DIAGNOSIS — I447 Left bundle-branch block, unspecified: Secondary | ICD-10-CM

## 2015-09-09 DIAGNOSIS — I429 Cardiomyopathy, unspecified: Secondary | ICD-10-CM | POA: Diagnosis not present

## 2015-09-09 DIAGNOSIS — Z4502 Encounter for adjustment and management of automatic implantable cardiac defibrillator: Secondary | ICD-10-CM

## 2015-09-09 DIAGNOSIS — Z9189 Other specified personal risk factors, not elsewhere classified: Secondary | ICD-10-CM

## 2015-09-09 DIAGNOSIS — I428 Other cardiomyopathies: Secondary | ICD-10-CM

## 2015-09-09 DIAGNOSIS — Z95810 Presence of automatic (implantable) cardiac defibrillator: Secondary | ICD-10-CM

## 2015-09-09 LAB — BASIC METABOLIC PANEL
BUN: 28 mg/dL — AB (ref 7–25)
CALCIUM: 10.5 mg/dL — AB (ref 8.6–10.4)
CO2: 26 mmol/L (ref 20–31)
CREATININE: 1.08 mg/dL — AB (ref 0.50–1.05)
Chloride: 103 mmol/L (ref 98–110)
Glucose, Bld: 67 mg/dL (ref 65–99)
POTASSIUM: 4.9 mmol/L (ref 3.5–5.3)
Sodium: 140 mmol/L (ref 135–146)

## 2015-09-09 LAB — MAGNESIUM: MAGNESIUM: 2.3 mg/dL (ref 1.5–2.5)

## 2015-09-09 MED ORDER — TORSEMIDE 20 MG PO TABS: 20.0000 mg | ORAL_TABLET | ORAL | 6 refills | Status: DC | PRN

## 2015-09-09 MED ORDER — LOSARTAN POTASSIUM 25 MG PO TABS: 12.5000 mg | ORAL_TABLET | Freq: Every day | ORAL | 3 refills | Status: DC

## 2015-09-09 MED ORDER — METOPROLOL SUCCINATE ER 25 MG PO TB24: 12.5000 mg | ORAL_TABLET | Freq: Every day | ORAL | 3 refills | Status: DC

## 2015-09-09 MED ORDER — SPIRONOLACTONE 25 MG PO TABS: 12.5000 mg | ORAL_TABLET | Freq: Every day | ORAL | 3 refills | Status: DC

## 2015-09-09 NOTE — Progress Notes (Signed)
Patient Care Team: Glenda Chroman, MD as PCP - General (Internal Medicine)   HPI  [name] is a 60 y.o. female Seen in follow-up for nonischemic cardiomyopathy left bundle branch block for which she underwent CRT-D implantation 1/16  Underwent [facility] which showed normal filling pressures and normal coronaries. EF 15-20% on ECHO in 08/2013  12/16 echo 55-60%\  Exercise tolerance is significantly improved although still class II.  She has occasional peripheral edema. In the context she takes extra Lasix. She is quite attentive to salt intake and fluid restriction  Last seen by CHF clinic and referred back to [facility]  Past Medical History:  Diagnosis Date  . Anxiety   . Asthma   . At risk for sudden cardiac death, life vest    . Bronchitis   . Chronic systolic HF (heart failure) ([place])    a. NICM b. RHC (09/08/13): RA: 2, RV 26/0/2, PA: 25/9 (15), PCWP: 9, Fick CO/CI: 4.6 / 2.5, PA 68% c. ECHO (08/2013) EF 20-25%, grade II DD, RV sys fx mildly reduced, mod TR  . Depression   . Fibromyalgia   . Hypothyroidism   . ICD (implantable cardioverter-defibrillator), dual, in situ 01/12/14   St. Jude ; for primary prevention for EF<35%  . Left bundle branch block   . NICM (nonischemic cardiomyopathy) ([place])    a. LHC (08/2013): no angiographic evidence of CAD    Past Surgical History:  Procedure Laterality Date  . ABDOMINAL HYSTERECTOMY    . BI-VENTRICULAR IMPLANTABLE CARDIOVERTER DEFIBRILLATOR N/A 01/12/2014   Procedure: BI-VENTRICULAR IMPLANTABLE CARDIOVERTER DEFIBRILLATOR  (CRT-D);  Surgeon: Deboraha Sprang, MD;  Location[facility] CATH LAB;  Service: Cardiovascular;  Laterality: N/A;  . LAPAROSCOPIC GASTRIC BANDING    . LEFT AND RIGHT HEART CATHETERIZATION WITH CORONARY ANGIOGRAM N/A 09/08/2013   Procedure: LEFT AND RIGHT HEART CATHETERIZATION WITH CORONARY ANGIOGRAM;  Surgeon: Burnell Blanks, MD;  Location: [facility] CATH LAB;  Service: Cardiovascular;  Laterality: N/A;  . SHOULDER OPEN  ROTATOR CUFF REPAIR Left     Current Outpatient Prescriptions  Medication Sig Dispense Refill  . acetaminophen (TYLENOL) 325 MG tablet Take 2 tablets (650 mg total) by mouth every 6 (six) hours as needed for mild pain or moderate pain.    Marland Kitchen aspirin EC 81 MG EC tablet Take 1 tablet (81 mg total) by mouth daily.    . cetirizine (ZYRTEC) 10 MG tablet Take 10 mg by mouth daily.    . diazepam (VALIUM) 5 MG tablet Take 2.5-5 mg by mouth at bedtime as needed for anxiety.    . diclofenac sodium (VOLTAREN) 1 % GEL Apply 1 application topically daily as needed (fibromyalgia).    . gabapentin (NEURONTIN) 300 MG capsule Take 600 mg by mouth 2 (two) times daily.     Marland Kitchen HYDROcodone-acetaminophen (NORCO) 7.5-325 MG per tablet Take 1 tablet by mouth 3 (three) times daily as needed for moderate pain.    Marland Kitchen levothyroxine (SYNTHROID, LEVOTHROID) 100 MCG tablet TAKE ONE TABLET EACH MORNING BEFORE BREAKFAST 30 tablet 3  . losartan (COZAAR) 25 MG tablet Take 0.5 tablets (12.5 mg total) by mouth daily. 45 tablet 3  . metoprolol succinate (TOPROL-XL) 25 MG 24 hr tablet Take 0.5 tablets (12.5 mg total) by mouth daily. 45 tablet 3  . polyethylene glycol (MIRALAX / GLYCOLAX) packet Take 17 g by mouth daily.    . sertraline (ZOLOFT) 100 MG tablet Take 100 mg by mouth daily.    Marland Kitchen spironolactone (ALDACTONE)  25 MG tablet Take 0.5 tablets (12.5 mg total) by mouth daily. At bedtime 45 tablet 3  . tiZANidine (ZANAFLEX) 4 MG tablet Take 2 mg by mouth 2 (two) times daily as needed for muscle spasms (for fibromyalgia).     . torsemide (DEMADEX) 20 MG tablet Take 20 mg by mouth daily. Make take an additional 1 tablet if needed for swelling     No current facility-administered medications for this visit.     Allergies  Allergen Reactions  . Nucynta [Tapentadol] Itching  . Percocet [Oxycodone-Acetaminophen] Itching    Patient can tolerate acetaminophen    Review of Systems negative except from HPI and PMH  Physical Exam BP  90/62   Pulse 60   Ht 5\' 3"  (1.6 m)   Wt 183 lb 9.6 oz (83.3 kg)   SpO2 99%   BMI 32.52 kg/m  Well developed and well nourished in no acute distress HENT normal E scleral and icterus clear Neck Supple JVP flat; carotids brisk and full Clear to ausculation Device pocket well healed; without hematoma or erythema.  There is no tethering Regular rate and rhythm, no murmurs gallops or rub Soft with active bowel sounds No clubbing cyanosis  Edema Alert and oriented, grossly normal motor and sensory function Skin Warm and Dry  ECG demonstrates AV pacing qrs d  which is decreased to 134 ms from 155 ms not withstanding the unusual activation sequence across the anterior precordium Assessment and  Plan  Nonischemic cardiomyopathy- interval resolution   Left bundle branch block  Congestive heart failure-chronic-systolic  CRT-D-St. Jude  The patient's device was interrogated and the information was fully reviewed.  The device was reprogrammed to  maximize longevity    Will continue current meds  Check BMET and MG   Refer back to Dr Neta Mends and pt would like to been for ICD in [place] so will ask Dr Greggory Brandy to follow her

## 2015-09-09 NOTE — Patient Instructions (Signed)
Medication Instructions: - Your physician recommends that you continue on your current medications as directed. Please refer to the Current Medication list given to you today.  Labwork: - Your physician recommends that you have lab work today: BMP/Mag  Procedures/Testing: - none  Follow-Up: - Remote monitoring is used to monitor your Pacemaker of ICD from home. This monitoring reduces the number of office visits required to check your device to one time per year. It allows [name] to keep an eye on the functioning of your device to ensure it is working properly. You are scheduled for a device check from home on 12/09/15. You may send your transmission at any time that day. If you have a wireless device, the transmission will be sent automatically. After your physician reviews your transmission, you will receive a postcard with your next transmission date.  - Your physician wants you to follow-up in: 6 months with Dr. Harl Bowie in [place] 1 year with Dr. Rayann Heman in [place]. You will receive a reminder letter in the mail two months in advance. If you don't receive a letter, please call our office to schedule the follow-up appointment.  Any Additional Special Instructions Will Be Listed Below (If Applicable).     If you need a refill on your cardiac medications before your next appointment, please call your pharmacy.

## 2015-09-10 ENCOUNTER — Other Ambulatory Visit: Payer: Self-pay | Admitting: *Deleted

## 2015-09-10 DIAGNOSIS — I5022 Chronic systolic (congestive) heart failure: Secondary | ICD-10-CM

## 2015-09-10 LAB — CUP PACEART INCLINIC DEVICE CHECK
Brady Statistic RV Percent Paced: 96 %
HighPow Impedance: 68.625
Implantable Lead Implant Date: 20160104
Implantable Lead Implant Date: 20160104
Implantable Lead Location: 753859
Implantable Lead Location: 753860
Lead Channel Impedance Value: 1012.5 Ohm
Lead Channel Impedance Value: 600 Ohm
Lead Channel Pacing Threshold Amplitude: 0.5 V
Lead Channel Pacing Threshold Amplitude: 0.5 V
Lead Channel Pacing Threshold Amplitude: 0.5 V
Lead Channel Pacing Threshold Amplitude: 0.75 V
Lead Channel Pacing Threshold Pulse Width: 0.5 ms
Lead Channel Pacing Threshold Pulse Width: 0.5 ms
Lead Channel Setting Pacing Amplitude: 1.5 V
Lead Channel Setting Pacing Pulse Width: 0.5 ms
Lead Channel Setting Sensing Sensitivity: 0.5 mV
MDC IDC LEAD IMPLANT DT: 20160104
MDC IDC LEAD LOCATION: 753858
MDC IDC LEAD MODEL: 7122
MDC IDC MSMT BATTERY REMAINING LONGEVITY: 69.6
MDC IDC MSMT LEADCHNL LV PACING THRESHOLD AMPLITUDE: 0.5 V
MDC IDC MSMT LEADCHNL LV PACING THRESHOLD PULSEWIDTH: 0.5 ms
MDC IDC MSMT LEADCHNL LV PACING THRESHOLD PULSEWIDTH: 0.5 ms
MDC IDC MSMT LEADCHNL RA IMPEDANCE VALUE: 462.5 Ohm
MDC IDC MSMT LEADCHNL RA SENSING INTR AMPL: 1.4 mV
MDC IDC MSMT LEADCHNL RV PACING THRESHOLD AMPLITUDE: 0.75 V
MDC IDC MSMT LEADCHNL RV PACING THRESHOLD PULSEWIDTH: 0.5 ms
MDC IDC MSMT LEADCHNL RV PACING THRESHOLD PULSEWIDTH: 0.5 ms
MDC IDC MSMT LEADCHNL RV SENSING INTR AMPL: 11.7 mV
MDC IDC PG SERIAL: 7222096
MDC IDC SESS DTM: 20170831183804
MDC IDC SET LEADCHNL LV PACING AMPLITUDE: 2 V
MDC IDC SET LEADCHNL LV PACING PULSEWIDTH: 0.5 ms
MDC IDC SET LEADCHNL RV PACING AMPLITUDE: 2.5 V
MDC IDC STAT BRADY RA PERCENT PACED: 27 %

## 2015-09-10 MED ORDER — TORSEMIDE 20 MG PO TABS: 20.0000 mg | ORAL_TABLET | Freq: Every day | ORAL | 6 refills | Status: DC | PRN

## 2015-10-09 ENCOUNTER — Ambulatory Visit (INDEPENDENT_AMBULATORY_CARE_PROVIDER_SITE_OTHER): Payer: 59

## 2015-10-09 DIAGNOSIS — Z23 Encounter for immunization: Secondary | ICD-10-CM

## 2015-12-09 ENCOUNTER — Telehealth: Payer: Self-pay | Admitting: Cardiology

## 2015-12-09 ENCOUNTER — Encounter: Payer: 59 | Admitting: *Deleted

## 2015-12-09 NOTE — Telephone Encounter (Signed)
Spoke with pt and reminded pt of remote transmission that is due today. Pt verbalized understanding and said she is currently out of state and she will do it when she gets back home.

## 2015-12-21 ENCOUNTER — Encounter: Payer: Self-pay | Admitting: Cardiology

## 2016-02-06 ENCOUNTER — Emergency Department (HOSPITAL_COMMUNITY)
Admission: EM | Admit: 2016-02-06 | Discharge: 2016-02-06 | Disposition: A | Discharge status: Home / Self Care | Payer: 59 | Attending: Emergency Medicine | Admitting: Emergency Medicine

## 2016-02-06 ENCOUNTER — Encounter (HOSPITAL_COMMUNITY): Payer: Self-pay | Admitting: *Deleted

## 2016-02-06 DIAGNOSIS — Z7982 Long term (current) use of aspirin: Secondary | ICD-10-CM | POA: Diagnosis not present

## 2016-02-06 DIAGNOSIS — J45909 Unspecified asthma, uncomplicated: Secondary | ICD-10-CM | POA: Insufficient documentation

## 2016-02-06 DIAGNOSIS — T7840XA Allergy, unspecified, initial encounter: Secondary | ICD-10-CM | POA: Insufficient documentation

## 2016-02-06 DIAGNOSIS — N182 Chronic kidney disease, stage 2 (mild): Secondary | ICD-10-CM | POA: Diagnosis not present

## 2016-02-06 DIAGNOSIS — E039 Hypothyroidism, unspecified: Secondary | ICD-10-CM | POA: Insufficient documentation

## 2016-02-06 DIAGNOSIS — I5022 Chronic systolic (congestive) heart failure: Secondary | ICD-10-CM | POA: Diagnosis not present

## 2016-02-06 DIAGNOSIS — Z79899 Other long term (current) drug therapy: Secondary | ICD-10-CM | POA: Diagnosis not present

## 2016-02-06 DIAGNOSIS — Z95810 Presence of automatic (implantable) cardiac defibrillator: Secondary | ICD-10-CM | POA: Insufficient documentation

## 2016-02-06 MED ORDER — HYDROXYZINE HCL 10 MG PO TABS: 10.0000 mg | ORAL_TABLET | Freq: Four times a day (QID) | ORAL | 0 refills | Status: DC | PRN

## 2016-02-06 MED ORDER — METHYLPREDNISOLONE SODIUM SUCC 125 MG IJ SOLR
125.0000 mg | Freq: Once | INTRAMUSCULAR | Status: AC
  Administered 2016-02-06: 125 mg via INTRAVENOUS
  Filled 2016-02-06: qty 2

## 2016-02-06 MED ORDER — FAMOTIDINE IN NACL 20-0.9 MG/50ML-% IV SOLN
20.0000 mg | INTRAVENOUS | Status: AC
  Administered 2016-02-06: 20 mg via INTRAVENOUS
  Filled 2016-02-06: qty 50

## 2016-02-06 MED ORDER — EPINEPHRINE 0.3 MG/0.3ML IJ SOAJ: 0.3000 mg | Freq: Once | INTRAMUSCULAR | 1 refills | Status: AC | PRN

## 2016-02-06 MED ORDER — SODIUM CHLORIDE 0.9 % IV BOLUS (SEPSIS)
1000.0000 mL | Freq: Once | INTRAVENOUS | Status: AC
  Administered 2016-02-06: 1000 mL via INTRAVENOUS

## 2016-02-06 MED ORDER — PREDNISONE 20 MG PO TABS: ORAL_TABLET | ORAL | 0 refills | Status: DC

## 2016-02-06 NOTE — ED Provider Notes (Signed)
[facility] DEPT Provider Note   CSN: [id number] Arrival date & time: 02/06/16  2051     History   Chief Complaint Chief Complaint  Patient presents with  . Allergic Reaction    HPI [name] is a 61 y.o. female.  The history is provided by the patient and medical records.  Allergic Reaction    61 y.o. F with hx of anxiety, asthma, bronchitis, CHF with estimated EF of 20-25%, depression, fibromyalgia, hypothyroidism, LBBB, presenting to the ED for possible allergic reaction.  Patient reports she was at the beach this weekend and had several bloody mary's, only new food she can recall.  States she got some from a restaurant, some she made in her room.  She states today she began feeling flushed and had some tingling inside her mouth.  States worse on right side.  She feels that her tongue is "thick" as well.  She has not had any issues swallowing, no shortness of breath, no sensation of throat closing.  States she has also developed a rash on her chest as well.  No new soaps or detergents.  No new medications.  Not currently on ACEI.  Has taken 7, 25mg  tabs of benadryl so far today.  Last dose was 50mg  2 hours ago.  Past Medical History:  Diagnosis Date  . Anxiety   . Asthma   . At risk for sudden cardiac death, life vest    . Bronchitis   . Chronic systolic HF (heart failure) ([place])    a. NICM b. RHC (09/08/13): RA: 2, RV 26/0/2, PA: 25/9 (15), PCWP: 9, Fick CO/CI: 4.6 / 2.5, PA 68% c. ECHO (08/2013) EF 20-25%, grade II DD, RV sys fx mildly reduced, mod TR  . Depression   . Fibromyalgia   . Hypothyroidism   . ICD (implantable cardioverter-defibrillator), dual, in situ 01/12/14   St. Jude ; for primary prevention for EF<35%  . Left bundle branch block   . NICM (nonischemic cardiomyopathy) ([place])    a. LHC (08/2013): no angiographic evidence of CAD    Patient Active Problem List   Diagnosis Date Noted  . Excessive daytime sleepiness 02/24/2015  . Fatigue September 13, 2014  . ICD  (implantable cardioverter-defibrillator) in place   . Chest pain 01/17/2014  . ICD (implantable cardioverter-defibrillator), dual, in situ - St. Jude 01/2014 01/13/2014  . Nonischemic cardiomyopathy ([place]) 01/12/2014  . OSA on CPAP 12/24/2013  . Left bundle branch block 10/01/2013  . Chronic systolic heart failure ([place]) 09/17/2013  . At risk for sudden cardiac death, life vest  2013-09-12  . Chest pain at rest, secondary to CHF 2013/09/12  . S/P cardiac cath, no evidence of CAD 09/08/13 09-12-2013  . Hypothyroidism 09-12-2013  . CKD (chronic kidney disease) stage 2, GFR 60-89 ml/min 09-12-13  . Acute on chronic combined systolic and diastolic HF (heart failure), NYHA class 3 ([place]) 09/05/2013    Past Surgical History:  Procedure Laterality Date  . ABDOMINAL HYSTERECTOMY    . BI-VENTRICULAR IMPLANTABLE CARDIOVERTER DEFIBRILLATOR N/A 01/12/2014   Procedure: BI-VENTRICULAR IMPLANTABLE CARDIOVERTER DEFIBRILLATOR  (CRT-D);  Surgeon: Deboraha Sprang, MD;  Location: [facility] CATH LAB;  Service: Cardiovascular;  Laterality: N/A;  . LAPAROSCOPIC GASTRIC BANDING    . LEFT AND RIGHT HEART CATHETERIZATION WITH CORONARY ANGIOGRAM N/A 09/08/2013   Procedure: LEFT AND RIGHT HEART CATHETERIZATION WITH CORONARY ANGIOGRAM;  Surgeon: Burnell Blanks, MD;  Location: [facility] CATH LAB;  Service: Cardiovascular;  Laterality: N/A;  . SHOULDER OPEN ROTATOR CUFF REPAIR Left  OB History    No data available       Home Medications    Prior to Admission medications   Medication Sig Start Date End Date Taking? Authorizing Provider  acetaminophen (TYLENOL) 325 MG tablet Take 2 tablets (650 mg total) by mouth every 6 (six) hours as needed for mild pain or moderate pain. 09/10/13   Isaiah Serge, NP  aspirin EC 81 MG EC tablet Take 1 tablet (81 mg total) by mouth daily. 09/10/13   Isaiah Serge, NP  cetirizine (ZYRTEC) 10 MG tablet Take 10 mg by mouth daily.    Historical Provider, MD  diazepam (VALIUM) 5 MG tablet  Take 2.5-5 mg by mouth at bedtime as needed for anxiety.    Historical Provider, MD  diclofenac sodium (VOLTAREN) 1 % GEL Apply 1 application topically daily as needed (fibromyalgia).    Historical Provider, MD  gabapentin (NEURONTIN) 300 MG capsule Take 600 mg by mouth 2 (two) times daily.  03/03/14   Historical Provider, MD  HYDROcodone-acetaminophen (NORCO) 7.5-325 MG per tablet Take 1 tablet by mouth 3 (three) times daily as needed for moderate pain.    Historical Provider, MD  levothyroxine (SYNTHROID, LEVOTHROID) 100 MCG tablet TAKE ONE TABLET EACH MORNING BEFORE BREAKFAST 01/13/15   Jolaine Artist, MD  losartan (COZAAR) 25 MG tablet Take 0.5 tablets (12.5 mg total) by mouth daily. 09/09/15   Deboraha Sprang, MD  metoprolol succinate (TOPROL-XL) 25 MG 24 hr tablet Take 0.5 tablets (12.5 mg total) by mouth daily. 09/09/15   Deboraha Sprang, MD  polyethylene glycol [facility] / Floria Raveling) packet Take 17 g by mouth daily.    Historical Provider, MD  sertraline (ZOLOFT) 100 MG tablet Take 100 mg by mouth daily.    Historical Provider, MD  spironolactone (ALDACTONE) 25 MG tablet Take 0.5 tablets (12.5 mg total) by mouth daily. At bedtime 09/09/15   Deboraha Sprang, MD  tiZANidine (ZANAFLEX) 4 MG tablet Take 2 mg by mouth 2 (two) times daily as needed for muscle spasms (for fibromyalgia).     Historical Provider, MD  torsemide (DEMADEX) 20 MG tablet Take 1 tablet (20 mg total) by mouth daily as needed (FOR SWELLING). 09/10/15   Deboraha Sprang, MD    Family History Family History  Problem Relation Age of Onset  . Hypertension Mother     deceased: adenocarcinoma, HLD  . Cancer Father     deceased: basal cell carcinoma  . Cancer Sister     breast  . Cancer Brother     skin  . Hyperlipidemia Brother     Social History Social History  Substance Use Topics  . Smoking status: Former Smoker    Packs/day: 1.00    Years: 16.00    Types: Cigarettes    Start date: 04/22/1972    Quit date: 09/25/1988  .  Smokeless tobacco: Never Used     Comment: Quit around 78  . Alcohol use 0.0 oz/week     Comment: occasionally     Allergies   Nucynta [tapentadol] and Percocet [oxycodone-acetaminophen]   Review of Systems Review of Systems  Constitutional:       Allergic rxn  All other systems reviewed and are negative.    Physical Exam Updated Vital Signs BP 140/72 (BP Location: Left Arm)   Pulse 67   Temp 98.1 F (36.7 C) (Oral)   Resp 20   Ht 5\' 3"  (1.6 m)   Wt 81.2 kg   SpO2 100%  BMI 31.71 kg/m   Physical Exam  Constitutional: She is oriented to person, place, and time. She appears well-developed and well-nourished.  HENT:  Head: Normocephalic and atraumatic.  Mouth/Throat: Oropharynx is clear and moist.  Cheeks appear flushed, no obvious lip or tongue swelling, airway is widely patent, no tonsillar edema or signs of obstruction; uvula midline; normal phonation without stridor, speech is clear  Eyes: Conjunctivae and EOM are normal. Pupils are equal, round, and reactive to light.  Neck: Normal range of motion.  Cardiovascular: Normal rate, regular rhythm and normal heart sounds.   Pulmonary/Chest: Effort normal and breath sounds normal. No respiratory distress. She has no wheezes.  Lungs clear, no distress, speaking in full sentences without difficulty, O2 sats normal on RA  Abdominal: Soft. Bowel sounds are normal. There is no tenderness. There is no rebound.  Musculoskeletal: Normal range of motion.  Neurological: She is alert and oriented to person, place, and time.  Skin: Skin is warm and dry. Rash noted.  Fine maculopapular rash across the chest and face, no vesicles or pustules, no drainage, no signs of superimposed infection  Psychiatric: She has a normal mood and affect.  Nursing note and vitals reviewed.    ED Treatments / Results  Labs (all labs ordered are listed, but only abnormal results are displayed) Labs Reviewed - No data to display  EKG  EKG  Interpretation None       Radiology No results found.  Procedures Procedures (including critical care time)  Medications Ordered in ED Medications  sodium chloride 0.9 % bolus 1,000 mL (0 mLs Intravenous Stopped 02/06/16 2239)  methylPREDNISolone sodium succinate (SOLU-MEDROL) 125 mg/2 mL injection 125 mg (125 mg Intravenous Given 02/06/16 2141)  famotidine (PEPCID) IVPB 20 mg premix (0 mg Intravenous Stopped 02/06/16 2201)     Initial Impression / Assessment and Plan / ED Course  I have reviewed the triage vital signs and the nursing notes.  Pertinent labs & imaging results that were available during my care of the patient were reviewed by me and considered in my medical decision making (see chart for details).  61 year old female here with possible allergic reaction. She reports some tingling in her tone and sensation of "thick tongue". No new medications, not on ACEI.  States only new food was [employer].  No new soaps/detergents.  On exam she is in no acute distress. Her vitals are stable. She has no appreciable lip or tongue swelling. She has normal phonation without stridor. Her airway is widely patent. She does have a fine maculopapular rash across her face and chest. No signs of superimposed infection. She had Benadryl 2 hours prior to arrival. Will give IV fluid bolus, Solu-Medrol, and Pepcid. Will monitor closely for any acute changes.  11:18 PM Patient reassessed. She has been monitored here for 2+ hours, symptoms seem to be improving. Her face appears less flushed and rash has nearly resolved at this time. She reports the "tingling" in her mouth and sensation of "thick tongue" has improved. She continues his foot sentences with normal phonation. No stridor. Airway remains widely patent. At this time, the patient is safe for discharge. We'll plan to continue Benadryl, prednisone taper, Atarax for itching and she has not had much relief with Benadryl. Advised to use caution as  these 2 medication can cause drowsiness.  She was written for EpiPen and instructed on use and need for emergent evaluation if used.  Encouraged close PCP follow-up.  Discussed plan with patient, she acknowledged  understanding and agreed with plan of care.  Return precautions given for new or worsening symptoms.  Final Clinical Impressions(s) / ED Diagnoses   Final diagnoses:  Allergic reaction, initial encounter    New Prescriptions New Prescriptions   EPINEPHRINE (EPIPEN 2-PAK) 0.3 MG/0.3 ML IJ SOAJ INJECTION    Inject 0.3 mLs (0.3 mg total) into the muscle once as needed (for severe allergic reaction). CAll 911 immediately if you have to use this medicine   HYDROXYZINE (ATARAX/VISTARIL) 10 MG TABLET    Take 1 tablet (10 mg total) by mouth every 6 (six) hours as needed for itching.   PREDNISONE (DELTASONE) 20 MG TABLET    Take 40 mg by mouth daily for 3 days, then 20mg  by mouth daily for 3 days, then 10mg  daily for 3 days     Larene Pickett, PA-C 02/06/16 [place], PA-C 02/06/16 [place], DO 02/09/16 1347

## 2016-02-06 NOTE — ED Triage Notes (Addendum)
Pt states yesterday she started getting a rash on her face and chest. Pt states her cheeks are swelling and feel numb. Pt states the right side of her throat feels swollen and she feels as if her tongue is swelling (feels thick) Pt states she has had multiple benadryl without relief. Pt states the only thing she had out of the ordinary were several bloody mary's.

## 2016-02-06 NOTE — Discharge Instructions (Signed)
Take the prescribed medication as directed. Would recommend to continue Benadryl every 6 hours. Use caution as benadryl combined with Atarax can cause some drowsiness. Only use epi-pen for throat swelling, trouble swallowing, shortness of breath, etc. You need to come to the ED for evaluation if used. Follow-up with your primary care doctor. Return to the ED for new or worsening symptoms.

## 2016-02-07 ENCOUNTER — Telehealth: Payer: Self-pay | Admitting: Cardiology

## 2016-02-07 NOTE — Telephone Encounter (Signed)
Pt aware - will update [name] on Wednesday

## 2016-02-07 NOTE — Telephone Encounter (Signed)
Has put on weight 10 lbs since Feb 04, 2016.  Very concerned   She went to [facility] last night due to allergic reaction to something.

## 2016-02-07 NOTE — Telephone Encounter (Signed)
Pt says lost 2 lbs since taking torsemide 20 mg yesterday and took another 20mg  this morning - c/o swelling in face and hands - pt denies CP/SOB/dizziness - scheduled appt with Dr. Harl Bowie 3/20 - routed to Dr. Harl Bowie

## 2016-02-07 NOTE — Telephone Encounter (Signed)
I would continue torsemide today and tomorrow. Have her update [name] on her symptoms Wednesday. From ER note they gave her fluids and steroids, both could be contributing to her weigh gain and edema.    Zandra Abts MD

## 2016-02-09 NOTE — Telephone Encounter (Signed)
I would take torsemide prn to keep her weights around 180-182 for now. COntinue to monitor for swelling or SOB.   Zandra Abts MD

## 2016-02-09 NOTE — Telephone Encounter (Signed)
WEIGHT today 182.8 took first thing this morning

## 2016-02-09 NOTE — Addendum Note (Signed)
Addended by: Laurine Blazer on: 02/09/2016 02:10 PM   Modules accepted: Orders

## 2016-02-09 NOTE — Telephone Encounter (Signed)
Can we get some additional info. How are her symptoms. What is her baseline weight and how does her current weight compare to that   Zandra Abts MD

## 2016-02-09 NOTE — Telephone Encounter (Signed)
Last seen in August by Dr. Caryl Comes & weight was 183.  On 02/07/16, her weight was 189.7.    179.5 went to the beach - 01/31/2016.    Had episode with allergic reaction on 02/06/2016 - was given steroids & Ranitidine along with fluids.  02/07/16 - weighed 189.7  Weight today is 182.8.   Does have slight swelling in her legs.  No c/o dizziness, chest pain, or SOB.    Does have OV scheduled for 03/28/16.    Since May 2017, states she has gradually gaining weight.  Had been as low as 163.

## 2016-02-10 NOTE — Telephone Encounter (Signed)
Fast busy signal, will try again later.

## 2016-02-11 NOTE — Telephone Encounter (Signed)
Patient notified.  Already has 1 year check up scheduled for 03/28/16 with Dr. Harl Bowie.

## 2016-03-08 DIAGNOSIS — I5022 Chronic systolic (congestive) heart failure: Secondary | ICD-10-CM | POA: Diagnosis not present

## 2016-03-08 DIAGNOSIS — M797 Fibromyalgia: Secondary | ICD-10-CM | POA: Diagnosis not present

## 2016-03-08 DIAGNOSIS — Z79891 Long term (current) use of opiate analgesic: Secondary | ICD-10-CM | POA: Diagnosis not present

## 2016-03-08 DIAGNOSIS — M47896 Other spondylosis, lumbar region: Secondary | ICD-10-CM | POA: Diagnosis not present

## 2016-03-08 DIAGNOSIS — N182 Chronic kidney disease, stage 2 (mild): Secondary | ICD-10-CM | POA: Diagnosis not present

## 2016-03-08 DIAGNOSIS — J45998 Other asthma: Secondary | ICD-10-CM | POA: Diagnosis not present

## 2016-03-08 DIAGNOSIS — M47892 Other spondylosis, cervical region: Secondary | ICD-10-CM | POA: Diagnosis not present

## 2016-03-08 DIAGNOSIS — M545 Low back pain: Secondary | ICD-10-CM | POA: Diagnosis not present

## 2016-03-09 ENCOUNTER — Encounter: Payer: Self-pay | Admitting: Cardiology

## 2016-03-15 ENCOUNTER — Ambulatory Visit (INDEPENDENT_AMBULATORY_CARE_PROVIDER_SITE_OTHER): Payer: 59 | Admitting: *Deleted

## 2016-03-15 DIAGNOSIS — I5022 Chronic systolic (congestive) heart failure: Secondary | ICD-10-CM

## 2016-03-15 DIAGNOSIS — I428 Other cardiomyopathies: Secondary | ICD-10-CM | POA: Diagnosis not present

## 2016-03-15 NOTE — Progress Notes (Signed)
Remote ICD transmission.   

## 2016-03-16 ENCOUNTER — Encounter: Payer: Self-pay | Admitting: Cardiology

## 2016-03-17 LAB — CUP PACEART REMOTE DEVICE CHECK
Battery Voltage: 2.98 V
Brady Statistic AP VS Percent: 1.2 %
Brady Statistic RA Percent Paced: 33 %
Date Time Interrogation Session: 20180307141011
HighPow Impedance: 65 Ohm
HighPow Impedance: 65 Ohm
Implantable Lead Location: 753859
Implantable Lead Location: 753860
Implantable Pulse Generator Implant Date: 20160104
Lead Channel Impedance Value: 460 Ohm
Lead Channel Impedance Value: 930 Ohm
Lead Channel Pacing Threshold Amplitude: 0.75 V
Lead Channel Pacing Threshold Amplitude: 0.75 V
Lead Channel Pacing Threshold Pulse Width: 0.5 ms
Lead Channel Pacing Threshold Pulse Width: 0.5 ms
Lead Channel Sensing Intrinsic Amplitude: 11.7 mV
Lead Channel Setting Pacing Amplitude: 1.5 V
Lead Channel Setting Pacing Pulse Width: 0.5 ms
Lead Channel Setting Sensing Sensitivity: 0.5 mV
MDC IDC LEAD IMPLANT DT: 20160104
MDC IDC LEAD IMPLANT DT: 20160104
MDC IDC LEAD IMPLANT DT: 20160104
MDC IDC LEAD LOCATION: 753858
MDC IDC MSMT BATTERY REMAINING LONGEVITY: 64 mo
MDC IDC MSMT BATTERY REMAINING PERCENTAGE: 70 %
MDC IDC MSMT LEADCHNL RA PACING THRESHOLD AMPLITUDE: 0.5 V
MDC IDC MSMT LEADCHNL RA SENSING INTR AMPL: 4 mV
MDC IDC MSMT LEADCHNL RV IMPEDANCE VALUE: 580 Ohm
MDC IDC MSMT LEADCHNL RV PACING THRESHOLD PULSEWIDTH: 0.5 ms
MDC IDC PG SERIAL: 7222096
MDC IDC SET LEADCHNL LV PACING AMPLITUDE: 2 V
MDC IDC SET LEADCHNL LV PACING PULSEWIDTH: 0.5 ms
MDC IDC SET LEADCHNL RV PACING AMPLITUDE: 2.5 V
MDC IDC STAT BRADY AP VP PERCENT: 31 %
MDC IDC STAT BRADY AS VP PERCENT: 63 %
MDC IDC STAT BRADY AS VS PERCENT: 4.5 %

## 2016-03-28 ENCOUNTER — Encounter: Payer: Self-pay | Admitting: Cardiology

## 2016-03-28 ENCOUNTER — Ambulatory Visit (INDEPENDENT_AMBULATORY_CARE_PROVIDER_SITE_OTHER): Payer: Medicare HMO | Admitting: Cardiology

## 2016-03-28 VITALS — BP 103/68 | HR 61 | Ht 63.0 in | Wt 185.0 lb

## 2016-03-28 DIAGNOSIS — I5023 Acute on chronic systolic (congestive) heart failure: Secondary | ICD-10-CM | POA: Diagnosis not present

## 2016-03-28 DIAGNOSIS — E669 Obesity, unspecified: Secondary | ICD-10-CM

## 2016-03-28 NOTE — Progress Notes (Signed)
Clinical Summary [name] is a 61 y.o.female seen today for follow up of the following medical problems.   1. Chronic systolic HF/NICM - new diagnosis during  admit 08/2013, LVEF 15-20%, grade II diastolic dysfunction, moderate TR with PASP 57. Post CRT-D placement LVEF increased to 45-50%.  - cath 09/08/13 patent coronaries with normal filling pressures - echo 12/2014 LVEF 81-44%, grade I diastolic dysfunction - CRT-D placed Jan 2016 by Dr Caryl Comes - medical therapy limited by soft bp's - was started on spironolactone, had severe dizziness and discontinued. ACE-I changed to ARB  by Dr Caryl Comes due to cough.    - normal ICD check 03/2016 - recent allergic reaction, given steroids and IVFs in ER. Had some increased edema after.  - swelling mildly improved. Takes torsemide 20mg  prn.  - has had some increased SOB at times. Increased DOE.  - home weights gradually up 17 Morgan. Limiting sodium intake.  - taking torsemide roughly once a week.      Past Medical History:  Diagnosis Date  . Anxiety   . Asthma   . At risk for sudden cardiac death, life vest    . Bronchitis   . Chronic systolic HF (heart failure) ([place])    a. NICM b. RHC (09/08/13): RA: 2, RV 26/0/2, PA: 25/9 (15), PCWP: 9, Fick CO/CI: 4.6 / 2.5, PA 68% c. ECHO (08/2013) EF 20-25%, grade II DD, RV sys fx mildly reduced, mod TR  . Depression   . Fibromyalgia   . Hypothyroidism   . ICD (implantable cardioverter-defibrillator), dual, in situ 01/12/14   St. Jude ; for primary prevention for EF<35%  . Left bundle Gwyn Hieronymus block   . NICM (nonischemic cardiomyopathy) ([place])    a. LHC (08/2013): no angiographic evidence of CAD     Allergies  Allergen Reactions  . Nexium [Esomeprazole Magnesium] Swelling and Rash  . Nucynta [Tapentadol] Itching  . Percocet [Oxycodone-Acetaminophen] Itching    Patient can tolerate acetaminophen     Current Outpatient Prescriptions  Medication Sig Dispense Refill  . acetaminophen (TYLENOL)  325 MG tablet Take 2 tablets (650 mg total) by mouth every 6 (six) hours as needed for mild pain or moderate pain.    Marland Kitchen aspirin EC 81 MG EC tablet Take 1 tablet (81 mg total) by mouth daily.    . cetirizine (ZYRTEC) 10 MG tablet Take 10 mg by mouth daily.    . diazepam (VALIUM) 5 MG tablet Take 2.5-5 mg by mouth at bedtime as needed for anxiety.    . diclofenac sodium (VOLTAREN) 1 % GEL Apply 1 application topically daily as needed (fibromyalgia).    . EPINEPHrine (EPIPEN 2-PAK) 0.3 mg/0.3 mL IJ SOAJ injection Inject 0.3 mLs (0.3 mg total) into the muscle once as needed (for severe allergic reaction). CAll 911 immediately if you have to use this medicine 1 Device 1  . gabapentin (NEURONTIN) 300 MG capsule Take 600 mg by mouth 2 (two) times daily.     Marland Kitchen HYDROcodone-acetaminophen (NORCO) 7.5-325 MG per tablet Take 1 tablet by mouth 3 (three) times daily as needed for moderate pain.    . hydrOXYzine (ATARAX/VISTARIL) 10 MG tablet Take 1 tablet (10 mg total) by mouth every 6 (six) hours as needed for itching. 20 tablet 0  . levothyroxine (SYNTHROID, LEVOTHROID) 125 MCG tablet Take 1 tablet by mouth daily.    Marland Kitchen losartan (COZAAR) 25 MG tablet Take 0.5 tablets (12.5 mg total) by mouth daily. 45 tablet 3  . metoprolol  succinate (TOPROL-XL) 25 MG 24 hr tablet Take 0.5 tablets (12.5 mg total) by mouth daily. 45 tablet 3  . polyethylene glycol (MIRALAX / GLYCOLAX) packet Take 17 g by mouth daily.    . ranitidine (ZANTAC) 150 MG tablet Take 1 tablet by mouth 2 (two) times daily.    . sertraline (ZOLOFT) 100 MG tablet Take 100 mg by mouth daily.    Marland Kitchen spironolactone (ALDACTONE) 25 MG tablet Take 0.5 tablets (12.5 mg total) by mouth daily. At bedtime 45 tablet 3  . tiZANidine (ZANAFLEX) 4 MG tablet Take 2 mg by mouth 2 (two) times daily as needed for muscle spasms (for fibromyalgia).     . torsemide (DEMADEX) 20 MG tablet Take 1 tablet (20 mg total) by mouth daily as needed (FOR SWELLING). 30 tablet 6   No  current facility-administered medications for this visit.      Past Surgical History:  Procedure Laterality Date  . ABDOMINAL HYSTERECTOMY    . BI-VENTRICULAR IMPLANTABLE CARDIOVERTER DEFIBRILLATOR N/A 01/12/2014   Procedure: BI-VENTRICULAR IMPLANTABLE CARDIOVERTER DEFIBRILLATOR  (CRT-D);  Surgeon: Deboraha Sprang, MD;  Location: [facility] CATH LAB;  Service: Cardiovascular;  Laterality: N/A;  . LAPAROSCOPIC GASTRIC BANDING    . LEFT AND RIGHT HEART CATHETERIZATION WITH CORONARY ANGIOGRAM N/A 09/08/2013   Procedure: LEFT AND RIGHT HEART CATHETERIZATION WITH CORONARY ANGIOGRAM;  Surgeon: Burnell Blanks, MD;  Location: [facility] CATH LAB;  Service: Cardiovascular;  Laterality: N/A;  . SHOULDER OPEN ROTATOR CUFF REPAIR Left      Allergies  Allergen Reactions  . Nexium [Esomeprazole Magnesium] Swelling and Rash  . Nucynta [Tapentadol] Itching  . Percocet [Oxycodone-Acetaminophen] Itching    Patient can tolerate acetaminophen      Family History  Problem Relation Age of Onset  . Hypertension Mother     deceased: adenocarcinoma, HLD  . Cancer Father     deceased: basal cell carcinoma  . Cancer Sister     breast  . Cancer Brother     skin  . Hyperlipidemia Brother      Social History Ms. Cifelli reports that she quit smoking about 27 years ago. Her smoking use included Cigarettes. She started smoking about 43 years ago. She has a 16.00 pack-year smoking history. She has never used smokeless tobacco. Ms. Frank reports that she drinks alcohol.   Review of Systems CONSTITUTIONAL: No weight loss, fever, chills, weakness or fatigue.  HEENT: Eyes: No visual loss, blurred vision, double vision or yellow sclerae.No hearing loss, sneezing, congestion, runny nose or sore throat.  SKIN: No rash or itching.  CARDIOVASCULAR: per HPI RESPIRATORY: No shortness of breath, cough or sputum.  GASTROINTESTINAL: No anorexia, nausea, vomiting or diarrhea. No abdominal pain or blood.  GENITOURINARY: No  burning on urination, no polyuria NEUROLOGICAL: No headache, dizziness, syncope, paralysis, ataxia, numbness or tingling in the extremities. No change in bowel or bladder control.  MUSCULOSKELETAL: No muscle, back pain, joint pain or stiffness.  LYMPHATICS: No enlarged nodes. No history of splenectomy.  PSYCHIATRIC: No history of depression or anxiety.  ENDOCRINOLOGIC: No reports of sweating, cold or heat intolerance. No polyuria or polydipsia.  Marland Kitchen   Physical Examination Vitals:   03/28/16 1522  BP: 103/68  Pulse: 61   Vitals:   03/28/16 1522  Weight: 185 lb (83.9 kg)  Height: 5\' 3"  (1.6 m)    Gen: resting comfortably, no acute distress HEENT: no scleral icterus, pupils equal round and reactive, no palptable cervical adenopathy,  CV: RRR, no m/r/g, no jvd Resp:  Clear to auscultation bilaterally GI: abdomen is soft, non-tender, non-distended, normal bowel sounds, no hepatosplenomegaly MSK: extremities are warm, no edema.  Skin: warm, no rash Neuro:  no focal deficits Psych: appropriate affect   Diagnostic Studies  09/08/13 Cath Hemodynamic Findings:  Ao: 94/66  LV: 93/4/8  RA: 2  RV: 26/0/2  PA: 25/9 (mean 15)  PCWP: 9  Fick Cardiac Output: 4.6 L/min  Fick Cardiac Index: 2.5 L/min/m2  Central Aortic Saturation: 96%  Pulmonary Artery Saturation: 68%  Angiographic Findings:  Left main: No obstructive disease.  Left Anterior Descending Artery: Large caliber vessel that courses to the apex. There are two large caliber diagonal branches. No obstructive disease.  Circumflex Artery: Large caliber vessel with a small caliber first obtuse marginal Zymere Patlan and a large caliber bifurcating second obtuse marginal Brookelyn Gaynor. No obstructive disease.  Right Coronary Artery: Large dominant vessel with no obstructive disease.  Left Ventricular Angiogram: Deferred.  Impression:  1. No angiographic evidence of CAD  2. Normal filling pressures.  3. Non-ischemic  cardiomyopathy  Recommendations: Continue medical management of her non-ischemic cardiomyopathy. Continue beta blocker. Would add Ace-inh as BP tolerates.  Complications: None; patient tolerated the procedure well.  08/2013 Echo Study Conclusions  - Left ventricle: There is diffuse hypokinesis with akinesis of the entire anterior, basal and mid anterolateral walls and paradoxical septal motion. The cavity size was severely dilated. Systolic function was severely reduced. The estimated ejection fraction was in the range of 15-20%. Features are consistent with a pseudonormal left ventricular filling pattern, with concomitant abnormal relaxation and increased filling pressure (grade 2 diastolic dysfunction). Doppler parameters are consistent with elevated ventricular end-diastolic filling pressure. - Aortic valve: Trileaflet; normal thickness leaflets. There was no regurgitation. - Aortic root: The aortic root was normal in size. - Left atrium: The atrium was mildly dilated. - Right ventricle: Systolic function was mildly reduced. - Right atrium: The atrium was normal in size. - Tricuspid valve: There was moderate regurgitation. - Pulmonary arteries: Systolic pressure was moderately to severely increased. PA peak pressure: 57 mm Hg (S).  Impressions:  - Severe dilatation of the left ventricle with severely decreased LVEF 15-20%. Elevated filling pressures. Regional wall motion abnormalities consistent with LBBB and suggestive of infarct in LAD territory. Moderate mitral and tricuspid regurgitation. Mild right ventricular systolic impairement and moderate to severe pulmonary hypertension.    Assessment and Plan   1. Acute on chronic systolic HF - recent weight gain, edema, and SOB after course of steroids for allergic reaction - only taking torsemide sporadically, encouraged more regular dosing.  - check BMET/Mg/BNP/cbc - she will update [name] on her weights later this  week  2. Obesity - BMI 33. We have referred to nutrition.    F/u 4 months       Arnoldo Lenis, M.D.

## 2016-03-28 NOTE — Patient Instructions (Addendum)
Medication Instructions:   Take your Torsemide daily till Friday, then call the office to update on your weights & any further adjustments after that.  Continue all other medications.    Labwork:  BMT, Magnesium, CBC, BNP - orders given today.  Office will contact with results via phone or letter.    Testing/Procedures: None   Referrals:  Nutrition - [facility]   Follow-Up: Your physician wants you to follow up in:  4 months.  You will receive a reminder letter in the mail one-two months in advance.  If you don't receive a letter, please call our office to schedule the follow up appointment   Any Other Special Instructions Will Be Listed Below (If Applicable).  If you need a refill on your cardiac medications before your next appointment, please call your pharmacy.

## 2016-03-29 ENCOUNTER — Other Ambulatory Visit: Payer: Self-pay | Admitting: *Deleted

## 2016-03-29 DIAGNOSIS — I5022 Chronic systolic (congestive) heart failure: Secondary | ICD-10-CM

## 2016-03-29 DIAGNOSIS — I428 Other cardiomyopathies: Secondary | ICD-10-CM

## 2016-03-29 DIAGNOSIS — Z9189 Other specified personal risk factors, not elsewhere classified: Secondary | ICD-10-CM

## 2016-03-29 DIAGNOSIS — Z4502 Encounter for adjustment and management of automatic implantable cardiac defibrillator: Secondary | ICD-10-CM

## 2016-03-29 MED ORDER — LOSARTAN POTASSIUM 25 MG PO TABS: 12.5000 mg | ORAL_TABLET | Freq: Every day | ORAL | 3 refills | Status: DC

## 2016-03-29 MED ORDER — METOPROLOL SUCCINATE ER 25 MG PO TB24: 12.5000 mg | ORAL_TABLET | Freq: Every day | ORAL | 3 refills | Status: DC

## 2016-03-29 MED ORDER — SPIRONOLACTONE 25 MG PO TABS: 12.5000 mg | ORAL_TABLET | Freq: Every day | ORAL | 3 refills | Status: DC

## 2016-04-12 ENCOUNTER — Encounter: Payer: Self-pay | Attending: Internal Medicine | Admitting: Nutrition

## 2016-04-12 VITALS — Ht 63.0 in | Wt 186.0 lb

## 2016-04-12 DIAGNOSIS — I5022 Chronic systolic (congestive) heart failure: Secondary | ICD-10-CM

## 2016-04-12 DIAGNOSIS — E669 Obesity, unspecified: Secondary | ICD-10-CM

## 2016-04-12 NOTE — Progress Notes (Signed)
  Medical Nutrition Therapy:  Appt start time: 1400 end time:  1500.   Assessment:  Primary concerns today: Obesity. Lives by herself. PMH: Depression/anxiety, CHF, COPD, and hypothyroidism.Cooks at home most meals. Most foods are baked and air fried.  Eats out 1-2 times a week. Avoids fast foods.  Wanting to lose weight down to 150 lbs. She has a lap band done in 2008 but has slowly regained weight over the last 1-2 years. Moved here from [place] and needs to find a bariatric MD to maybe re-adjust her lap band.      Diet is higher in calories causing weight gain and low in fresh fruits, vegetables and high fiber foods.  She appears willing to be committed to making lifestyle changes needed for weight loss.   Preferred Learning Style:  No preference indicated   Learning Readiness:   Ready  Change in progress   MEDICATIONS:   DIETARY INTAKE:  24-hr recall:  B ( AM): Egg whites with 1/2 apple sauce and some spinach and zucchini, coffee, apple juice 8 z Snk ( AM): none  L ( PM): 2 eggs, 2 slices of bacon and 2 pancakes, water Snk ( PM):  D ( PM): Protein bar, Sweet tea or stevia flavored water Snk ( PM): gram crackers Beverages:  Water, tea, unswt tea , juice or chocolate milk  Usual physical activity: ADL  Estimated energy needs: 1200  calories 135 g carbohydrates 90 g protein 33 g fat  Progress Towards Goal(s):  In progress.   Nutritional Diagnosis:  NI-1.5 Excessive energy intake As related to higher fat, sugary foods.  As evidenced by BMI > 30.    Intervention:  Heart Healthy diet, low sodium high fiber, My Plate, portion sizes, meal planning, reading food labels, benefits of exercise, avoiding snacks and processed foods and healthy weight loss tips.    Goals 1. Follow My Plate 2. Cut out juice 3. Eating more fruit and vegetables. 4. Exercise with walking 1-2 miles per day 5. Lose 2-3 lbs per month. 6. Eat meals on time. 7.Cut out snacking  Teaching Method  Utilized:  Visual Auditory Hands on  Handouts given during visit include:  The Plate Method  Meal Plan Card    Barriers to learning/adherence to lifestyle change: None  Demonstrated degree of understanding via:  Teach Back   Monitoring/Evaluation:  Dietary intake, exercise, meal planning, and body weight in 2 month(s).

## 2016-04-12 NOTE — Patient Instructions (Addendum)
Goals 1. Follow My Plate 2. Cut out juice 3. Eating more fruit and vegetables. 4. Exercise with walking 1-2 miles per day 5. Lose 2-3 per month. 6. Eat meals on time. 7.Cut out snacking

## 2016-04-19 ENCOUNTER — Telehealth: Payer: Self-pay | Admitting: *Deleted

## 2016-04-19 NOTE — Telephone Encounter (Signed)
Pt aware - routed to pcp  

## 2016-04-19 NOTE — Telephone Encounter (Signed)
-----   Message from Arnoldo Lenis, MD sent at 04/18/2016 11:23 AM EDT ----- Labs look good  J BrancH MD

## 2016-06-14 ENCOUNTER — Ambulatory Visit (INDEPENDENT_AMBULATORY_CARE_PROVIDER_SITE_OTHER): Payer: Medicare HMO | Admitting: *Deleted

## 2016-06-14 DIAGNOSIS — I428 Other cardiomyopathies: Secondary | ICD-10-CM | POA: Diagnosis not present

## 2016-06-14 NOTE — Progress Notes (Signed)
Remote ICD transmission.   

## 2016-06-20 ENCOUNTER — Encounter (HOSPITAL_COMMUNITY): Payer: Self-pay | Admitting: Emergency Medicine

## 2016-06-20 ENCOUNTER — Emergency Department (HOSPITAL_COMMUNITY): Payer: Medicare HMO

## 2016-06-20 ENCOUNTER — Inpatient Hospital Stay (HOSPITAL_COMMUNITY)
Admission: EM | Admit: 2016-06-20 | Discharge: 2016-06-23 | DRG: 439 | Disposition: A | Discharge status: Home / Self Care | Payer: Medicare HMO | Attending: Internal Medicine | Admitting: Internal Medicine

## 2016-06-20 ENCOUNTER — Encounter: Payer: Self-pay | Admitting: Cardiology

## 2016-06-20 DIAGNOSIS — R197 Diarrhea, unspecified: Secondary | ICD-10-CM | POA: Diagnosis present

## 2016-06-20 DIAGNOSIS — K851 Biliary acute pancreatitis without necrosis or infection: Secondary | ICD-10-CM | POA: Diagnosis present

## 2016-06-20 DIAGNOSIS — Z87891 Personal history of nicotine dependence: Secondary | ICD-10-CM

## 2016-06-20 DIAGNOSIS — I428 Other cardiomyopathies: Secondary | ICD-10-CM

## 2016-06-20 DIAGNOSIS — A0811 Acute gastroenteropathy due to Norwalk agent: Secondary | ICD-10-CM | POA: Diagnosis present

## 2016-06-20 DIAGNOSIS — Z888 Allergy status to other drugs, medicaments and biological substances status: Secondary | ICD-10-CM | POA: Diagnosis not present

## 2016-06-20 DIAGNOSIS — N182 Chronic kidney disease, stage 2 (mild): Secondary | ICD-10-CM | POA: Diagnosis present

## 2016-06-20 DIAGNOSIS — J45909 Unspecified asthma, uncomplicated: Secondary | ICD-10-CM | POA: Diagnosis present

## 2016-06-20 DIAGNOSIS — F329 Major depressive disorder, single episode, unspecified: Secondary | ICD-10-CM | POA: Diagnosis present

## 2016-06-20 DIAGNOSIS — Z7982 Long term (current) use of aspirin: Secondary | ICD-10-CM

## 2016-06-20 DIAGNOSIS — Z95810 Presence of automatic (implantable) cardiac defibrillator: Secondary | ICD-10-CM | POA: Diagnosis not present

## 2016-06-20 DIAGNOSIS — M797 Fibromyalgia: Secondary | ICD-10-CM | POA: Diagnosis present

## 2016-06-20 DIAGNOSIS — K87 Disorders of gallbladder, biliary tract and pancreas in diseases classified elsewhere: Secondary | ICD-10-CM

## 2016-06-20 DIAGNOSIS — E039 Hypothyroidism, unspecified: Secondary | ICD-10-CM | POA: Diagnosis present

## 2016-06-20 DIAGNOSIS — K8020 Calculus of gallbladder without cholecystitis without obstruction: Secondary | ICD-10-CM | POA: Diagnosis present

## 2016-06-20 DIAGNOSIS — G4733 Obstructive sleep apnea (adult) (pediatric): Secondary | ICD-10-CM

## 2016-06-20 DIAGNOSIS — I5022 Chronic systolic (congestive) heart failure: Secondary | ICD-10-CM | POA: Diagnosis present

## 2016-06-20 DIAGNOSIS — Z79899 Other long term (current) drug therapy: Secondary | ICD-10-CM | POA: Diagnosis not present

## 2016-06-20 DIAGNOSIS — Z885 Allergy status to narcotic agent status: Secondary | ICD-10-CM | POA: Diagnosis not present

## 2016-06-20 DIAGNOSIS — Z9989 Dependence on other enabling machines and devices: Secondary | ICD-10-CM

## 2016-06-20 DIAGNOSIS — Z90710 Acquired absence of both cervix and uterus: Secondary | ICD-10-CM

## 2016-06-20 DIAGNOSIS — I429 Cardiomyopathy, unspecified: Secondary | ICD-10-CM | POA: Diagnosis present

## 2016-06-20 DIAGNOSIS — K859 Acute pancreatitis without necrosis or infection, unspecified: Secondary | ICD-10-CM

## 2016-06-20 DIAGNOSIS — Z9889 Other specified postprocedural states: Secondary | ICD-10-CM

## 2016-06-20 LAB — CUP PACEART REMOTE DEVICE CHECK
Battery Remaining Longevity: 60 mo
Battery Remaining Percentage: 67 %
Brady Statistic AP VP Percent: 33 %
Brady Statistic AS VS Percent: 3.2 %
Brady Statistic RA Percent Paced: 34 %
Date Time Interrogation Session: 20180606122313
HIGH POWER IMPEDANCE MEASURED VALUE: 66 Ohm
HIGH POWER IMPEDANCE MEASURED VALUE: 66 Ohm
Implantable Lead Implant Date: 20160104
Implantable Lead Implant Date: 20160104
Implantable Lead Location: 753858
Implantable Lead Location: 753860
Implantable Pulse Generator Implant Date: 20160104
Lead Channel Impedance Value: 460 Ohm
Lead Channel Impedance Value: 580 Ohm
Lead Channel Impedance Value: 930 Ohm
Lead Channel Pacing Threshold Amplitude: 0.75 V
Lead Channel Pacing Threshold Amplitude: 0.875 V
Lead Channel Pacing Threshold Pulse Width: 0.5 ms
Lead Channel Pacing Threshold Pulse Width: 0.5 ms
Lead Channel Pacing Threshold Pulse Width: 0.5 ms
Lead Channel Setting Sensing Sensitivity: 0.5 mV
MDC IDC LEAD IMPLANT DT: 20160104
MDC IDC LEAD LOCATION: 753859
MDC IDC MSMT BATTERY VOLTAGE: 2.98 V
MDC IDC MSMT LEADCHNL RA PACING THRESHOLD AMPLITUDE: 0.625 V
MDC IDC MSMT LEADCHNL RA SENSING INTR AMPL: 2 mV
MDC IDC MSMT LEADCHNL RV SENSING INTR AMPL: 11.7 mV
MDC IDC PG SERIAL: 7222096
MDC IDC SET LEADCHNL LV PACING AMPLITUDE: 2 V
MDC IDC SET LEADCHNL LV PACING PULSEWIDTH: 0.5 ms
MDC IDC SET LEADCHNL RA PACING AMPLITUDE: 1.625
MDC IDC SET LEADCHNL RV PACING AMPLITUDE: 2.5 V
MDC IDC SET LEADCHNL RV PACING PULSEWIDTH: 0.5 ms
MDC IDC STAT BRADY AP VS PERCENT: 1 %
MDC IDC STAT BRADY AS VP PERCENT: 63 %

## 2016-06-20 LAB — URINALYSIS, ROUTINE W REFLEX MICROSCOPIC
Bilirubin Urine: NEGATIVE
GLUCOSE, UA: NEGATIVE mg/dL
Hgb urine dipstick: NEGATIVE
KETONES UR: NEGATIVE mg/dL
LEUKOCYTES UA: NEGATIVE
NITRITE: NEGATIVE
PH: 6 (ref 5.0–8.0)
PROTEIN: NEGATIVE mg/dL
Specific Gravity, Urine: 1.02 (ref 1.005–1.030)

## 2016-06-20 LAB — LIPASE, BLOOD: LIPASE: 302 U/L — AB (ref 11–51)

## 2016-06-20 LAB — CBC WITH DIFFERENTIAL/PLATELET
BASOS ABS: 0 10*3/uL (ref 0.0–0.1)
BASOS PCT: 0 %
EOS ABS: 0.2 10*3/uL (ref 0.0–0.7)
EOS PCT: 2 %
HCT: 44.6 % (ref 36.0–46.0)
Hemoglobin: 14.5 g/dL (ref 12.0–15.0)
Lymphocytes Relative: 2 %
Lymphs Abs: 0.3 10*3/uL — ABNORMAL LOW (ref 0.7–4.0)
MCH: 30.5 pg (ref 26.0–34.0)
MCHC: 32.5 g/dL (ref 30.0–36.0)
MCV: 93.9 fL (ref 78.0–100.0)
MONO ABS: 0.8 10*3/uL (ref 0.1–1.0)
MONOS PCT: 6 %
NEUTROS ABS: 12.5 10*3/uL — AB (ref 1.7–7.7)
Neutrophils Relative %: 90 %
PLATELETS: 163 10*3/uL (ref 150–400)
RBC: 4.75 MIL/uL (ref 3.87–5.11)
RDW: 13 % (ref 11.5–15.5)
WBC: 13.8 10*3/uL — ABNORMAL HIGH (ref 4.0–10.5)

## 2016-06-20 LAB — COMPREHENSIVE METABOLIC PANEL
ALBUMIN: 4.4 g/dL (ref 3.5–5.0)
ALT: 22 U/L (ref 14–54)
ANION GAP: 9 (ref 5–15)
AST: 29 U/L (ref 15–41)
Alkaline Phosphatase: 82 U/L (ref 38–126)
BILIRUBIN TOTAL: 0.7 mg/dL (ref 0.3–1.2)
BUN: 34 mg/dL — AB (ref 6–20)
CHLORIDE: 104 mmol/L (ref 101–111)
CO2: 26 mmol/L (ref 22–32)
Calcium: 9.8 mg/dL (ref 8.9–10.3)
Creatinine, Ser: 1.08 mg/dL — ABNORMAL HIGH (ref 0.44–1.00)
GFR calc Af Amer: >60 mL/min (ref >60)
GFR calc non Af Amer: 55 mL/min — ABNORMAL LOW (ref >60)
GLUCOSE: 116 mg/dL — AB (ref 65–99)
POTASSIUM: 4.9 mmol/L (ref 3.5–5.1)
Sodium: 139 mmol/L (ref 135–145)
TOTAL PROTEIN: 7.8 g/dL (ref 6.5–8.1)

## 2016-06-20 LAB — TSH: TSH: 0.841 u[IU]/mL (ref 0.350–4.500)

## 2016-06-20 MED ORDER — POLYETHYLENE GLYCOL 3350 17 G PO PACK: 17.0000 g | PACK | Freq: Every day | ORAL | Status: DC | PRN

## 2016-06-20 MED ORDER — ONDANSETRON HCL 4 MG/2ML IJ SOLN
4.0000 mg | Freq: Once | INTRAMUSCULAR | Status: AC
  Administered 2016-06-20: 4 mg via INTRAVENOUS

## 2016-06-20 MED ORDER — PROMETHAZINE HCL 25 MG/ML IJ SOLN
12.5000 mg | Freq: Once | INTRAMUSCULAR | Status: AC
  Administered 2016-06-20: 12.5 mg via INTRAVENOUS

## 2016-06-20 MED ORDER — ONDANSETRON 4 MG PO TBDP: 4.0000 mg | ORAL_TABLET | Freq: Once | ORAL | Status: DC

## 2016-06-20 MED ORDER — FAMOTIDINE 20 MG PO TABS
20.0000 mg | ORAL_TABLET | Freq: Every day | ORAL | Status: DC
  Administered 2016-06-20 – 2016-06-21 (×2): 20 mg via ORAL
  Filled 2016-06-20 (×2): qty 1

## 2016-06-20 MED ORDER — ONDANSETRON HCL 4 MG/2ML IJ SOLN
4.0000 mg | INTRAMUSCULAR | Status: DC | PRN
  Administered 2016-06-20 – 2016-06-22 (×4): 4 mg via INTRAVENOUS
  Filled 2016-06-20 (×4): qty 2

## 2016-06-20 MED ORDER — METOPROLOL SUCCINATE ER 25 MG PO TB24
12.5000 mg | ORAL_TABLET | Freq: Every day | ORAL | Status: DC
  Administered 2016-06-20: 12.5 mg via ORAL
  Filled 2016-06-20: qty 1

## 2016-06-20 MED ORDER — DIAZEPAM 5 MG PO TABS
2.5000 mg | ORAL_TABLET | Freq: Every evening | ORAL | Status: DC | PRN
  Administered 2016-06-21 – 2016-06-22 (×2): 5 mg via ORAL
  Filled 2016-06-20 (×2): qty 1

## 2016-06-20 MED ORDER — ONDANSETRON 4 MG PO TBDP
4.0000 mg | ORAL_TABLET | Freq: Once | ORAL | Status: AC
  Administered 2016-06-20: 4 mg via ORAL

## 2016-06-20 MED ORDER — SODIUM CHLORIDE 0.9 % IV BOLUS (SEPSIS)
1000.0000 mL | Freq: Once | INTRAVENOUS | Status: AC
  Administered 2016-06-20: 1000 mL via INTRAVENOUS

## 2016-06-20 MED ORDER — ONDANSETRON HCL 4 MG PO TABS: 4.0000 mg | ORAL_TABLET | Freq: Four times a day (QID) | ORAL | Status: DC | PRN

## 2016-06-20 MED ORDER — ONDANSETRON HCL 4 MG/2ML IJ SOLN: 4.0000 mg | Freq: Four times a day (QID) | INTRAMUSCULAR | Status: DC | PRN

## 2016-06-20 MED ORDER — ONDANSETRON 4 MG PO TBDP
ORAL_TABLET | ORAL | Status: AC
  Filled 2016-06-20: qty 1

## 2016-06-20 MED ORDER — LEVOTHYROXINE SODIUM 25 MCG PO TABS
125.0000 ug | ORAL_TABLET | Freq: Every day | ORAL | Status: DC
  Administered 2016-06-21 – 2016-06-23 (×3): 125 ug via ORAL
  Filled 2016-06-20 (×3): qty 1

## 2016-06-20 MED ORDER — FENTANYL CITRATE (PF) 100 MCG/2ML IJ SOLN
25.0000 ug | INTRAMUSCULAR | Status: DC | PRN
  Administered 2016-06-20 – 2016-06-22 (×9): 25 ug via INTRAVENOUS
  Filled 2016-06-20 (×9): qty 2

## 2016-06-20 MED ORDER — LORAZEPAM 2 MG/ML IJ SOLN
1.0000 mg | Freq: Once | INTRAMUSCULAR | Status: AC
  Administered 2016-06-20: 1 mg via INTRAVENOUS
  Filled 2016-06-20: qty 1

## 2016-06-20 MED ORDER — DEXTROSE-NACL 5-0.9 % IV SOLN
INTRAVENOUS | Status: DC
  Administered 2016-06-20 – 2016-06-21 (×3): via INTRAVENOUS

## 2016-06-20 MED ORDER — ONDANSETRON HCL 4 MG/2ML IJ SOLN
4.0000 mg | Freq: Once | INTRAMUSCULAR | Status: AC
  Administered 2016-06-20: 4 mg via INTRAVENOUS
  Filled 2016-06-20: qty 2

## 2016-06-20 MED ORDER — ASPIRIN EC 81 MG PO TBEC
81.0000 mg | DELAYED_RELEASE_TABLET | Freq: Every day | ORAL | Status: DC
  Administered 2016-06-20 – 2016-06-23 (×4): 81 mg via ORAL
  Filled 2016-06-20 (×4): qty 1

## 2016-06-20 MED ORDER — HYDROXYZINE HCL 10 MG PO TABS: 10.0000 mg | ORAL_TABLET | Freq: Four times a day (QID) | ORAL | Status: DC | PRN

## 2016-06-20 MED ORDER — SODIUM CHLORIDE 0.9% FLUSH
3.0000 mL | Freq: Two times a day (BID) | INTRAVENOUS | Status: DC
  Administered 2016-06-20 – 2016-06-21 (×2): 3 mL via INTRAVENOUS

## 2016-06-20 MED ORDER — DIPHENOXYLATE-ATROPINE 2.5-0.025 MG PO TABS
ORAL_TABLET | ORAL | Status: AC
  Administered 2016-06-20: 2
  Filled 2016-06-20: qty 2

## 2016-06-20 MED ORDER — ONDANSETRON HCL 4 MG/2ML IJ SOLN
INTRAMUSCULAR | Status: AC
  Filled 2016-06-20: qty 2

## 2016-06-20 MED ORDER — GABAPENTIN 300 MG PO CAPS
600.0000 mg | ORAL_CAPSULE | Freq: Two times a day (BID) | ORAL | Status: DC
  Administered 2016-06-20 – 2016-06-23 (×6): 600 mg via ORAL
  Filled 2016-06-20 (×6): qty 2

## 2016-06-20 MED ORDER — HEPARIN SODIUM (PORCINE) 5000 UNIT/ML IJ SOLN
5000.0000 [IU] | Freq: Three times a day (TID) | INTRAMUSCULAR | Status: DC
  Administered 2016-06-20 – 2016-06-23 (×7): 5000 [IU] via SUBCUTANEOUS
  Filled 2016-06-20 (×6): qty 1

## 2016-06-20 MED ORDER — SERTRALINE HCL 50 MG PO TABS
100.0000 mg | ORAL_TABLET | Freq: Every day | ORAL | Status: DC
  Administered 2016-06-20 – 2016-06-23 (×4): 100 mg via ORAL
  Filled 2016-06-20 (×4): qty 2

## 2016-06-20 MED ORDER — ONDANSETRON HCL 4 MG PO TABS: 4.0000 mg | ORAL_TABLET | ORAL | Status: DC | PRN

## 2016-06-20 NOTE — ED Provider Notes (Signed)
Complains of epigastric pain with  Multiple episodes ofvomiting and diarrhea onset last night after eating chili. No hematemesis no blood per rectum.No fever. She  Denies heavy alcohol use stating she drinks only 5 or 6 alcoholic beverages per year   Orlie Dakin, MD 06/20/16 1433

## 2016-06-20 NOTE — ED Triage Notes (Signed)
Pt reports n/v/d since 5am.  Pt having active diarrhea on arrival to ED.

## 2016-06-20 NOTE — ED Notes (Signed)
Pt requesting nausea medication. Dr. Marin Comment notified. Order given for one time order of Zofran 4mg  IV.

## 2016-06-20 NOTE — ED Provider Notes (Signed)
[facility] DEPT Provider Note   CSN: [id number] Arrival date & time: 06/20/16  0906     History   Chief Complaint Chief Complaint  Patient presents with  . Emesis  . Diarrhea    HPI [name] is a 61 y.o. female.  The history is provided by the patient. No language interpreter was used.  Emesis   This is a new problem. The current episode started 6 to 12 hours ago. The problem occurs continuously. The problem has not changed since onset.The emesis has an appearance of stomach contents. There has been no fever. Risk factors include suspect food intake.   Pt reports she ate chili yesterday.  Pt reports she began having vomiting and diarrhea this am at 5:00a.  Pt complains of upper abdominal pain.  Pt complains of cramping.   Pt reports she has fibromyalgia and thinks cramps may be because she has not been able to take her medication.   Pt has a history of heart problems.  Past Medical History:  Diagnosis Date  . Anxiety   . Asthma   . At risk for sudden cardiac death, life vest    . Bronchitis   . Chronic systolic HF (heart failure) ([place])    a. NICM b. RHC (09/08/13): RA: 2, RV 26/0/2, PA: 25/9 (15), PCWP: 9, Fick CO/CI: 4.6 / 2.5, PA 68% c. ECHO (08/2013) EF 20-25%, grade II DD, RV sys fx mildly reduced, mod TR  . Depression   . Fibromyalgia   . Hypothyroidism   . ICD (implantable cardioverter-defibrillator), dual, in situ 01/12/14   St. Jude ; for primary prevention for EF<35%  . Left bundle branch block   . NICM (nonischemic cardiomyopathy) ([place])    a. LHC (08/2013): no angiographic evidence of CAD    Patient Active Problem List   Diagnosis Date Noted  . Excessive daytime sleepiness 02/24/2015  . Fatigue 09/25/2014  . ICD (implantable cardioverter-defibrillator) in place   . Chest pain 01/17/2014  . ICD (implantable cardioverter-defibrillator), dual, in situ - St. Jude 01/2014 01/13/2014  . Nonischemic cardiomyopathy ([place]) 01/12/2014  . OSA on CPAP 12/24/2013  .  Left bundle branch block 10/01/2013  . Chronic systolic heart failure ([place]) 09/17/2013  . At risk for sudden cardiac death, life vest  09-24-2013  . Chest pain at rest, secondary to CHF 24-Sep-2013  . S/P cardiac cath, no evidence of CAD 09/08/13 09/24/13  . Hypothyroidism 24-Sep-2013  . CKD (chronic kidney disease) stage 2, GFR 60-89 ml/min 09/24/13  . Acute on chronic combined systolic and diastolic HF (heart failure), NYHA class 3 ([place]) 09/05/2013    Past Surgical History:  Procedure Laterality Date  . ABDOMINAL HYSTERECTOMY    . BI-VENTRICULAR IMPLANTABLE CARDIOVERTER DEFIBRILLATOR N/A 01/12/2014   Procedure: BI-VENTRICULAR IMPLANTABLE CARDIOVERTER DEFIBRILLATOR  (CRT-D);  Surgeon: Deboraha Sprang, MD;  Location[facility] CATH LAB;  Service: Cardiovascular;  Laterality: N/A;  . LAPAROSCOPIC GASTRIC BANDING    . LEFT AND RIGHT HEART CATHETERIZATION WITH CORONARY ANGIOGRAM N/A 09/08/2013   Procedure: LEFT AND RIGHT HEART CATHETERIZATION WITH CORONARY ANGIOGRAM;  Surgeon: Burnell Blanks, MD;  Location: [facility] CATH LAB;  Service: Cardiovascular;  Laterality: N/A;  . SHOULDER OPEN ROTATOR CUFF REPAIR Left     OB History    No data available       Home Medications    Prior to Admission medications   Medication Sig Start Date End Date Taking? Authorizing Provider  acetaminophen (TYLENOL) 325 MG tablet Take 2 tablets (650  mg total) by mouth every 6 (six) hours as needed for mild pain or moderate pain. 09/10/13  Yes Isaiah Serge, NP  aspirin EC 81 MG EC tablet Take 1 tablet (81 mg total) by mouth daily. 09/10/13  Yes Isaiah Serge, NP  BIOTIN PO Take 1 tablet by mouth daily.   Yes [provider]  cetirizine (ZYRTEC) 10 MG tablet Take 10 mg by mouth daily.   Yes [provider]  diazepam (VALIUM) 5 MG tablet Take 2.5-5 mg by mouth at bedtime as needed for anxiety.   Yes [provider]  diclofenac sodium (VOLTAREN) 1 % GEL Apply 1 application topically daily as  needed (fibromyalgia).   Yes [provider]  EPINEPHrine (EPIPEN 2-PAK) 0.3 mg/0.3 mL IJ SOAJ injection Inject 0.3 mLs (0.3 mg total) into the muscle once as needed (for severe allergic reaction). CAll 911 immediately if you have to use this medicine 02/06/16  Yes Baird Cancer, Vilinda Blanks, PA-C  gabapentin (NEURONTIN) 300 MG capsule Take 600 mg by mouth 2 (two) times daily.  03/03/14  Yes [provider]  HYDROcodone-acetaminophen (NORCO) 7.5-325 MG per tablet Take 1 tablet by mouth 3 (three) times daily as needed for moderate pain.   Yes [provider]  levothyroxine (SYNTHROID, LEVOTHROID) 125 MCG tablet Take 1 tablet by mouth daily. 01/11/16  Yes [provider]  losartan (COZAAR) 25 MG tablet Take 0.5 tablets (12.5 mg total) by mouth daily. 03/29/16  Yes BranchAlphonse Guild, MD  metoprolol succinate (TOPROL-XL) 25 MG 24 hr tablet Take 0.5 tablets (12.5 mg total) by mouth daily. 03/29/16  Yes BranchAlphonse Guild, MD  Multiple Vitamin (MULTIVITAMIN WITH MINERALS) TABS tablet Take 1 tablet by mouth daily.   Yes [provider]  polyethylene glycol (MIRALAX / GLYCOLAX) packet Take 17 g by mouth daily as needed for mild constipation.    Yes [provider]  promethazine (PHENERGAN) 25 MG tablet Take 25 mg by mouth once.   Yes [provider]  ranitidine (ZANTAC) 150 MG tablet Take 1 tablet by mouth 2 (two) times daily. 01/17/16  Yes [provider]  sertraline (ZOLOFT) 100 MG tablet Take 100 mg by mouth daily.   Yes [provider]  spironolactone (ALDACTONE) 25 MG tablet Take 0.5 tablets (12.5 mg total) by mouth daily. At bedtime 03/29/16  Yes Branch, Alphonse Guild, MD  tiZANidine (ZANAFLEX) 4 MG tablet Take 2 mg by mouth 2 (two) times daily as needed for muscle spasms (for fibromyalgia).    Yes [provider]  torsemide (DEMADEX) 20 MG tablet Take 1 tablet (20 mg total) by mouth daily as needed (FOR SWELLING). 09/10/15  Yes Deboraha Sprang, MD  Turmeric 450 MG CAPS Take 450 mg by mouth daily.   Yes [provider]  hydrOXYzine (ATARAX/VISTARIL) 10 MG tablet Take 1 tablet (10 mg total) by mouth every 6 (six) hours as needed for itching. 02/06/16   Larene Pickett, PA-C    Family History Family History  Problem Relation Age of Onset  . Hypertension Mother        deceased: adenocarcinoma, HLD  . Cancer Father        deceased: basal cell carcinoma  . Cancer Sister        breast  . Cancer Brother        skin  . Hyperlipidemia Brother     Social History Social History  Substance Use Topics  . Smoking status: Former Smoker  Packs/day: 1.00    Years: 16.00    Types: Cigarettes    Start date: 04/22/1972    Quit date: 09/25/1988  . Smokeless tobacco: Never Used     Comment: Quit around 53  . Alcohol use 0.0 oz/week     Comment: occasionally     Allergies   Nexium [esomeprazole magnesium]; Nucynta [tapentadol]; and Percocet [oxycodone-acetaminophen]   Review of Systems Review of Systems  Gastrointestinal: Positive for vomiting.  All other systems reviewed and are negative.    Physical Exam Updated Vital Signs BP 130/64 (BP Location: Right Arm)   Pulse 93   Temp 98.3 F (36.8 C) (Oral)   Ht 5\' 6"  (1.676 m)   Wt 84.4 kg (186 lb)   SpO2 97%   BMI 30.02 kg/m   Physical Exam  Constitutional: She appears well-developed and well-nourished. No distress.  HENT:  Head: Normocephalic and atraumatic.  Right Ear: External ear normal.  Left Ear: External ear normal.  Eyes: Conjunctivae are normal.  Neck: Neck supple.  Cardiovascular: Normal rate and regular rhythm.   No murmur heard. Pulmonary/Chest: Effort normal and breath sounds normal. No respiratory distress.  Abdominal: Soft. There is tenderness.  Tender diffuse abdomen and right upper abdomen   Musculoskeletal: She exhibits no edema.  Neurological: She is alert.  Skin: Skin is warm and dry.  Psychiatric: She has a normal mood  and affect.  Nursing note and vitals reviewed.    ED Treatments / Results  Labs (all labs ordered are listed, but only abnormal results are displayed) Labs Reviewed  CBC WITH DIFFERENTIAL/PLATELET - Abnormal; Notable for the following:       Result Value   WBC 13.8 (*)    Neutro Abs 12.5 (*)    Lymphs Abs 0.3 (*)    All other components within normal limits  COMPREHENSIVE METABOLIC PANEL - Abnormal; Notable for the following:    Glucose, Bld 116 (*)    BUN 34 (*)    Creatinine, Ser 1.08 (*)    GFR calc non Af Amer 55 (*)    All other components within normal limits  LIPASE, BLOOD - Abnormal; Notable for the following:    Lipase 302 (*)    All other components within normal limits  URINALYSIS, ROUTINE W REFLEX MICROSCOPIC    EKG  EKG Interpretation None       Radiology No results found.  Procedures Procedures (including critical care time)  Medications Ordered in ED Medications  ondansetron (ZOFRAN) 4 MG/2ML injection (not administered)  ondansetron (ZOFRAN-ODT) disintegrating tablet 4 mg (4 mg Oral Not Given 06/20/16 0928)  diphenoxylate-atropine (LOMOTIL) 2.5-0.025 MG per tablet (2 tablets  Given 06/20/16 0944)  ondansetron (ZOFRAN) injection 4 mg (4 mg Intravenous Given 06/20/16 0931)  ondansetron (ZOFRAN-ODT) disintegrating tablet 4 mg (4 mg Oral Given 06/20/16 0928)  sodium chloride 0.9 % bolus 1,000 mL (0 mLs Intravenous Stopped 06/20/16 1106)     Initial Impression / Assessment and Plan / ED Course  I have reviewed the triage vital signs and the nursing notes.  Pertinent labs & imaging results that were available during my care of the patient were reviewed by me and considered in my medical decision making (see chart for details).     Pt given zofran and imodium.  Pt reports some relief.  Pt has elevated wbc count and elevated lipase.    Ultrasound shows gallstones.    Final Clinical Impressions(s) / ED Diagnoses   Final diagnoses:  Disorders of  gallbladder, biliary tract and pancreas in diseases classified elsewhere  Acute pancreatitis, unspecified complication status, unspecified pancreatitis type    New Prescriptions New Prescriptions   No medications on file   I discussed with Dr. Marin Comment.  He will admit for further evaluation.   Fransico Meadow, PA-C 06/20/16 1425    [address] [place] 06/20/16 1436    Orlie Dakin, MD 06/20/16 1642

## 2016-06-20 NOTE — ED Triage Notes (Signed)
EMS called initially for chest pain, upon arrival patient c/o n/v/d.  Since 5am  Stated she had taken phenergan and had an episode of vomiting.  1 episode of vomiting per patient and dry heaves since that episodes BP 117/80 per EMS

## 2016-06-20 NOTE — H&P (Signed)
History and Physical    STERLING UCCI GZF:[id number] DOB: 07/04/1955 DOA: 06/20/2016  PCP: Glenda Chroman, MD  Patient coming from: Home.    Chief Complaint:  Abdominal pain for one day.   HPI: [name] is an 61 y.o. female with hx of NICMP with EF 15%, s/p ppm and IVCD, hx of LBBB, LHC 2015 with no CAD, seeing Dr Harl Bowie, hx of hypothyrodism, depression, fibromyalgia, non drinker, non smoker, presented to the ER with epigastric pain, nausea, vomting after eating chili. She has no ill contact, fever, chills, CP or SOB.  Evaluation in the ER included a lipase of 300's, normal LFTs, and mild leukocytosis with WBC of 13K.  She has no fever.  RUQ US showed gallstone, but no dilated CBD (40m), and no evidence of acute cholecystitis.  She never had pancreatitis previously.  Hospitalist was asked to admit her for acute pancreatitis of unclear source.   ED Course:  See above.  Rewiew of Systems:  Constitutional: Negative for malaise, fever and chills. No significant weight loss or weight gain Eyes: Negative for eye pain, redness and discharge, diplopia, visual changes, or flashes of light. ENMT: Negative for ear pain, hoarseness, nasal congestion, sinus pressure and sore throat. No headaches; tinnitus, drooling, or problem swallowing. Cardiovascular: Negative for chest pain, palpitations, diaphoresis, dyspnea and peripheral edema. ; No orthopnea, PND Respiratory: Negative for cough, hemoptysis, wheezing and stridor. No pleuritic chestpain. Gastrointestinal: Negative for constipation,  melena, blood in stool, hematemesis, jaundice and rectal bleeding.    Genitourinary: Negative for frequency, dysuria, incontinence,flank pain and hematuria; Musculoskeletal: Negative for back pain and neck pain. Negative for swelling and trauma.;  Skin: . Negative for pruritus, rash, abrasions, bruising and skin lesion.; ulcerations Neuro: Negative for headache, lightheadedness and neck stiffness. Negative for  weakness, altered level of consciousness , altered mental status, extremity weakness, burning feet, involuntary movement, seizure and syncope.  Psych: negative for anxiety, depression, insomnia, tearfulness, panic attacks, hallucinations, paranoia, suicidal or homicidal ideation   Past Medical History:  Diagnosis Date  . Anxiety   . Asthma   . At risk for sudden cardiac death, life vest    . Bronchitis   . Chronic systolic HF [place]) [place]    a. NICM b. RHC (09/08/13): RA: 2, RV 26/0/2, PA: 25/9 (15), PCWP: 9, Fick CO/CI: 4.6 / 2.5, PA 68% c. ECHO (08/2013) EF 20-25%, grade II DD, RV sys fx mildly reduced, mod TR  . Depression   . Fibromyalgia   . Hypothyroidism   . ICD (implantable cardioverter-defibrillator), dual, in situ 01/12/14   St. Jude ; for primary prevention for EF<35%  . Left bundle branch block   . NIC[place]ischemic [place]) [place]    a. LHC (08/2013): no angiographic evidence of CAD    Past Surgical History:  Procedure Laterality Date  . ABDOMINAL HYSTERECTOMY    . BI-VENTRICULAR IMPLANTABLE CARDIOVERTER DEFIBRILLATOR N/A 01/12/2014   Procedure: BI-VENTRICULAR IMPLANTABLE CARDIOVERTER DEFIBRILLATOR  (CRT-D);  Surgeon: SDeboraha Sprang MD;  [facility];  Service: Cardiovascular;  Laterality: N/A;  . LAPAROSCOPIC GASTRIC BANDING    . LEFT AND RIGHT HEART CATHETERIZATION WITH CORONARY ANGIOGRAM N/A 09/08/2013   Procedure: LEFT AND RIGHT HEART CATHETERIZATION WITH CORONARY ANGIOGRAM;  Surgeon: CBurnell Blanks MD;  [facility];  Service: Cardiovascular;  Laterality: N/A;  . SHOULDER OPEN ROTATOR CUFF REPAIR Left      reports that she quit smoking about 27 years ago. Her smoking use included  Cigarettes. She started smoking about 44 years ago. She has a 16.00 pack-year smoking history. She has never used smokeless tobacco. She reports that she drinks alcohol. She reports that she does not use drugs.  Allergies  Allergen Reactions  . Nexium  [Esomeprazole Magnesium] Swelling and Rash  . Nucynta [Tapentadol] Itching  . Percocet [Oxycodone-Acetaminophen] Itching    Patient can tolerate acetaminophen    Family History  Problem Relation Age of Onset  . Hypertension Mother        deceased: adenocarcinoma, HLD  . Cancer Father        deceased: basal cell carcinoma  . Cancer Sister        breast  . Cancer Brother        skin  . Hyperlipidemia Brother      Prior to Admission medications   Medication Sig Start Date End Date Taking? Authorizing Provider  acetaminophen (TYLENOL) 325 MG tablet Take 2 tablets (650 mg total) by mouth every 6 (six) hours as needed for mild pain or moderate pain. 09/10/13  Yes Isaiah Serge, NP  aspirin EC 81 MG EC tablet Take 1 tablet (81 mg total) by mouth daily. 09/10/13  Yes Isaiah Serge, NP  BIOTIN PO Take 1 tablet by mouth daily.   Yes [provider]  cetirizine (ZYRTEC) 10 MG tablet Take 10 mg by mouth daily.   Yes [provider]  diazepam (VALIUM) 5 MG tablet Take 2.5-5 mg by mouth at bedtime as needed for anxiety.   Yes [provider]  diclofenac sodium (VOLTAREN) 1 % GEL Apply 1 application topically daily as needed (fibromyalgia).   Yes [provider]  EPINEPHrine (EPIPEN 2-PAK) 0.3 mg/0.3 mL IJ SOAJ injection Inject 0.3 mLs (0.3 mg total) into the muscle once as needed (for severe allergic reaction). CAll 911 immediately if you have to use this medicine 02/06/16  Yes Baird Cancer, Vilinda Blanks, PA-C  gabapentin (NEURONTIN) 300 MG capsule Take 600 mg by mouth 2 (two) times daily.  03/03/14  Yes [provider]  HYDROcodone-acetaminophen (NORCO) 7.5-325 MG per tablet Take 1 tablet by mouth 3 (three) times daily as needed for moderate pain.   Yes [provider]  levothyroxine (SYNTHROID, LEVOTHROID) 125 MCG tablet Take 1 tablet by mouth daily. 01/11/16  Yes [provider]  losartan (COZAAR) 25 MG tablet Take 0.5 tablets (12.5 mg total) by  mouth daily. 03/29/16  Yes BranchAlphonse Guild, MD  metoprolol succinate (TOPROL-XL) 25 MG 24 hr tablet Take 0.5 tablets (12.5 mg total) by mouth daily. 03/29/16  Yes BranchAlphonse Guild, MD  Multiple Vitamin (MULTIVITAMIN WITH MINERALS) TABS tablet Take 1 tablet by mouth daily.   Yes [provider]  polyethylene glycol (MIRALAX / GLYCOLAX) packet Take 17 g by mouth daily as needed for mild constipation.    Yes [provider]  promethazine (PHENERGAN) 25 MG tablet Take 25 mg by mouth once.   Yes [provider]  ranitidine (ZANTAC) 150 MG tablet Take 1 tablet by mouth 2 (two) times daily. 01/17/16  Yes [provider]  sertraline (ZOLOFT) 100 MG tablet Take 100 mg by mouth daily.   Yes [provider]  spironolactone (ALDACTONE) 25 MG tablet Take 0.5 tablets (12.5 mg total) by mouth daily. At bedtime 03/29/16  Yes Branch, Alphonse Guild, MD  tiZANidine (ZANAFLEX) 4 MG tablet Take 2 mg by mouth 2 (two) times daily as needed for muscle spasms (for fibromyalgia).    Yes [provider]  torsemide (DEMADEX) 20 MG tablet Take 1 tablet (20 mg total) by mouth daily as needed (FOR SWELLING). 09/10/15  Yes Deboraha Sprang, MD  Turmeric 450 MG CAPS Take 450 mg by mouth daily.   Yes [provider]  hydrOXYzine (ATARAX/VISTARIL) 10 MG tablet Take 1 tablet (10 mg total) by mouth every 6 (six) hours as needed for itching. 02/06/16   Larene Pickett, PA-C    Physical Exam: Vitals:   06/20/16 1334 06/20/16 1400 06/20/16 1430 06/20/16 1500  BP: 118/71 122/70 (!) 95/53 (!) 98/54  Pulse: 97 (!) 102 (!) 106 (!) 108  Temp:      TempSrc:      SpO2: 92% 93% 94% 92%  Weight:      Height:          Constitutional: NAD, calm, comfortable Vitals:   06/20/16 1334 06/20/16 1400 06/20/16 1430 06/20/16 1500  BP: 118/71 122/70 (!) 95/53 (!) 98/54  Pulse: 97 (!) 102 (!) 106 (!) 108  Temp:      TempSrc:      SpO2: 92% 93% 94% 92%  Weight:      Height:        Eyes: PERRL, lids and conjunctivae normal ENMT: Mucous membranes are moist. Posterior pharynx clear of any exudate or lesions.Normal dentition.  Neck: normal, supple, no masses, no thyromegaly Respiratory: clear to auscultation bilaterally, no wheezing, no crackles. Normal respiratory effort. No accessory muscle use.  Cardiovascular: Regular rate and rhythm, no murmurs / rubs / gallops. No extremity edema. 2+ pedal pulses. No carotid bruits.  Abdomen: slightly tender over the epigastrium. no masses palpated. No hepatosplenomegaly. Bowel sounds positive.  Musculoskeletal: no clubbing / cyanosis. No joint deformity upper and lower extremities. Good ROM, no contractures. Normal muscle tone.  Skin: no rashes, lesions, ulcers. No induration Neurologic: CN 2-12 grossly intact. Sensation intact, DTR normal. Strength 5/5 in all 4.  Psychiatric: Normal judgment and insight. Alert and oriented x 3. Normal mood.   Labs on Admission: I have personally reviewed following labs and imaging studies CBC:  Recent Labs Lab 06/20/16 0957  WBC 13.8*  NEUTROABS 12.5*  HGB 14.5  HCT 44.6  MCV 93.9  PLT 007   Basic Metabolic Panel:  Recent Labs Lab 06/20/16 0957  NA 139  K 4.9  CL 104  CO2 26  GLUCOSE 116*  BUN 34*  CREATININE 1.08*  CALCIUM 9.8   GFR: Estimated Creatinine Clearance: 61.4 mL/min (A) (by C-G formula based on SCr of 1.08 mg/dL (H)). Liver Function Tests:  Recent Labs Lab 06/20/16 0957  AST 29  ALT 22  ALKPHOS 82  BILITOT 0.7  PROT 7.8  ALBUMIN 4.4    Recent Labs Lab 06/20/16 0957  LIPASE 302*   Urine analysis:    Component Value Date/Time   COLORURINE YELLOW 06/20/2016 1145   APPEARANCEUR CLEAR 06/20/2016 1145   LABSPEC 1.020 06/20/2016 1145   PHURINE 6.0 06/20/2016 1145   GLUCOSEU NEGATIVE 06/20/2016 1145   HGBUR NEGATIVE 06/20/2016 1145   BILIRUBINUR NEGATIVE 06/20/2016 1145   KETONESUR NEGATIVE 06/20/2016 1145   PROTEINUR NEGATIVE 06/20/2016 1145    UROBILINOGEN 0.2 01/17/2014 1355   NITRITE NEGATIVE 06/20/2016 1145   LEUKOCYTESUR NEGATIVE 06/20/2016 1145   Radiological Exams on Admission: US Abdomen Complete  Result Date: 06/20/2016 CLINICAL DATA:  Abdominal pain, nausea/ vomiting/diarrhea EXAM: ABDOMEN ULTRASOUND COMPLETE COMPARISON:  None. FINDINGS: Gallbladder: Multiple gallstones measuring up to 17 mm. No gallbladder wall thickening or pericholecystic fluid. Common bile  duct: Diameter: 7 mm Liver: Hyperechoic hepatic parenchyma. 5.7 cm cyst in the right hepatic lobe. IVC: No abnormality visualized. Pancreas: Visualized portion unremarkable. Spleen: Size and appearance within normal limits. Right Kidney: Length: 9.5 cm.  No mass or hydronephrosis. Left Kidney: Length: 11.4 cm.  No mass or hydronephrosis. Abdominal aorta: No aneurysm visualized. Other findings: None. IMPRESSION: Cholelithiasis, without associated sonographic findings to suggest acute cholecystitis. Hyperechoic hepatic parenchyma, nonspecific but raising the possibility of hepatic steatosis. 5.7 cm benign hepatic cyst. Electronically Signed   By: Julian Hy M.D.   On: 06/20/2016 13:45    EKG: Independently reviewed.  Paced rhythm.   Assessment/Plan Principal Problem:   Acute pancreatitis Active Problems:   S/P cardiac cath, no evidence of CAD 09/08/13   Hypothyroidism   CKD (chronic kidney disease) stage 2, GFR 60-89 ml/min   OSA on CPAP   Nonischemic cardiomyopathy (HCC)   ICD (implantable cardioverter-defibrillator), dual, in situ - St. Jude 01/2014   PLAN:   Acute pancreatitis:  She has no evidence of biliary obstruction with no dilated CBD and no elevation of LFT.  No evidence of acute cholecystitis.  Her mild leukocytosis is likely due to pancreatitis.  She met 2/3 criteria for acute pancreatitis (elevated lipase and clinical symptoms).  Etiology is unclear, though her diuretic may cause this.  She has a pacer and exclude MRCP.  Will give bowel rest, gentle  IVF given EF 15%, anti emetic and analgesic.  She could have a passed stone.    Cholelithiasis:  Elective lap CCY may be considered.     OSA with CPAP: Will continue   CKD: Stable.   Hypothyroidism:  Will continue with meds.  Check TSH.    DVT prophylaxis: lovenox. Code Status: FULL CODE.  Family Communication: brother at bedside.  Disposition Plan: home.  Consults called:  Admission status: inpatient.    Jaidee Stipe MD FACP. [facility]  If 7PM-7AM, please contact night-coverage [email or web address] Password [facility]  06/20/2016, 4:27 PM

## 2016-06-21 DIAGNOSIS — E039 Hypothyroidism, unspecified: Secondary | ICD-10-CM

## 2016-06-21 DIAGNOSIS — G4733 Obstructive sleep apnea (adult) (pediatric): Secondary | ICD-10-CM

## 2016-06-21 DIAGNOSIS — K851 Biliary acute pancreatitis without necrosis or infection: Principal | ICD-10-CM

## 2016-06-21 DIAGNOSIS — I428 Other cardiomyopathies: Secondary | ICD-10-CM

## 2016-06-21 DIAGNOSIS — R197 Diarrhea, unspecified: Secondary | ICD-10-CM

## 2016-06-21 DIAGNOSIS — Z9989 Dependence on other enabling machines and devices: Secondary | ICD-10-CM

## 2016-06-21 LAB — COMPREHENSIVE METABOLIC PANEL
ALBUMIN: 3.7 g/dL (ref 3.5–5.0)
ALT: 17 U/L (ref 14–54)
ANION GAP: 8 (ref 5–15)
AST: 25 U/L (ref 15–41)
Alkaline Phosphatase: 63 U/L (ref 38–126)
BILIRUBIN TOTAL: 0.6 mg/dL (ref 0.3–1.2)
BUN: 21 mg/dL — ABNORMAL HIGH (ref 6–20)
CO2: 22 mmol/L (ref 22–32)
Calcium: 9.3 mg/dL (ref 8.9–10.3)
Chloride: 108 mmol/L (ref 101–111)
Creatinine, Ser: 0.93 mg/dL (ref 0.44–1.00)
GFR calc Af Amer: >60 mL/min (ref >60)
Glucose, Bld: 101 mg/dL — ABNORMAL HIGH (ref 65–99)
POTASSIUM: 3.7 mmol/L (ref 3.5–5.1)
Sodium: 138 mmol/L (ref 135–145)
TOTAL PROTEIN: 6.8 g/dL (ref 6.5–8.1)

## 2016-06-21 LAB — CBC
HCT: 41.2 % (ref 36.0–46.0)
Hemoglobin: 13.1 g/dL (ref 12.0–15.0)
MCH: 30.1 pg (ref 26.0–34.0)
MCHC: 31.8 g/dL (ref 30.0–36.0)
MCV: 94.7 fL (ref 78.0–100.0)
PLATELETS: 165 10*3/uL (ref 150–400)
RBC: 4.35 MIL/uL (ref 3.87–5.11)
RDW: 13.4 % (ref 11.5–15.5)
WBC: 7.5 10*3/uL (ref 4.0–10.5)

## 2016-06-21 LAB — C DIFFICILE QUICK SCREEN W PCR REFLEX
C DIFFICILE (CDIFF) INTERP: NOT DETECTED
C DIFFICLE (CDIFF) ANTIGEN: NEGATIVE
C Diff toxin: NEGATIVE

## 2016-06-21 LAB — LIPASE, BLOOD: LIPASE: 17 U/L (ref 11–51)

## 2016-06-21 MED ORDER — LOPERAMIDE HCL 2 MG PO CAPS
2.0000 mg | ORAL_CAPSULE | Freq: Four times a day (QID) | ORAL | Status: DC | PRN
  Administered 2016-06-21 (×2): 2 mg via ORAL
  Filled 2016-06-21 (×2): qty 1

## 2016-06-21 MED ORDER — FAMOTIDINE IN NACL 20-0.9 MG/50ML-% IV SOLN: 20.0000 mg | Freq: Once | INTRAVENOUS | Status: DC

## 2016-06-21 MED ORDER — SODIUM CHLORIDE 0.9 % IV SOLN
INTRAVENOUS | Status: DC
  Administered 2016-06-21 – 2016-06-22 (×3): via INTRAVENOUS

## 2016-06-21 MED ORDER — SODIUM CHLORIDE 0.9 % IV BOLUS (SEPSIS)
500.0000 mL | Freq: Once | INTRAVENOUS | Status: AC
  Administered 2016-06-21: 500 mL via INTRAVENOUS

## 2016-06-21 MED ORDER — FAMOTIDINE 20 MG PO TABS
20.0000 mg | ORAL_TABLET | Freq: Two times a day (BID) | ORAL | Status: DC
  Administered 2016-06-21 – 2016-06-23 (×4): 20 mg via ORAL
  Filled 2016-06-21 (×4): qty 1

## 2016-06-21 MED ORDER — TRAMADOL HCL 50 MG PO TABS
50.0000 mg | ORAL_TABLET | Freq: Four times a day (QID) | ORAL | Status: AC | PRN
  Administered 2016-06-22 (×2): 50 mg via ORAL
  Filled 2016-06-21 (×3): qty 1

## 2016-06-21 NOTE — Progress Notes (Signed)
PROGRESS NOTE    [name]  TKW:[id number] DOB: [birth year] DOA: 06/20/2016 PCP: Glenda Chroman, MD    Brief Narrative:  61 year old female presented to the hospital with abdominal pain, found to have pancreatitis which is likely related to gallstones. She's been admitted for supportive management with IV fluids, pain management and bowel rest.   Assessment & Plan:   Principal Problem:   Acute pancreatitis Active Problems:   S/P cardiac cath, no evidence of CAD 09/08/13   Hypothyroidism   CKD (chronic kidney disease) stage 2, GFR 60-89 ml/min   OSA on CPAP   Nonischemic cardiomyopathy ([place])   ICD (implantable cardioverter-defibrillator), dual, in situ - St. Jude 01/2014   1. Acute pancreatitis. Suspect gallstone pancreatitis. She does have evidence of cholelithiasis, but CBD does not appear dilated. Overall lipase has improved. She possibly passed a stone. She continues to have abdominal pain. We'll hold off on advancing diet for now. Possibly start liquids tomorrow if she is improving. Ultimately, she will likely need a cholecystectomy, but this can be pursued as an outpatient. Continue IV fluids and pain management. She still requiring frequent IV narcotics. 2. History of nonischemic cardiomyopathy with EF of 10-15% in the past status post AICD. Most recent echocardiogram from 2016 shows a normal ejection fraction. Hold beta blocker for now since blood pressure is soft. 3. Diarrhea. Possibly related to pancreatitis. Check stool for C. difficile and check pathogen panel. If negative, can use Imodium. 4. Obstructive sleep apnea. Continue on CPAP. 5. Hypothyroidism. Continue on Synthroid. TSH is normal.   DVT prophylaxis: Heparin Code Status: Full code Family Communication: Discussed with sisters at the bedside Disposition Plan: Likely discharge home once improved    Consultants:     Procedures:     Antimicrobials:      Subjective: She has had 6 BM since this  morning. Also had vomiting. Complains of headache at this time. Still has abdominal pain  Objective: Vitals:   06/20/16 2235 06/21/16 0102 06/21/16 0614 06/21/16 0805  BP: (!) 111/53  (!) 95/44 (!) 100/51  Pulse: 99  81   Resp: 16  16   Temp: 100 F (37.8 C) 99.8 F (37.7 C)    TempSrc: Oral Oral Oral   SpO2: 96%  96%   Weight:      Height:        Intake/Output Summary (Last 24 hours) at 06/21/16 1337 Last data filed at 06/21/16 1004  Gross per 24 hour  Intake           703.75 ml  Output             1500 ml  Net          -796.25 ml   Filed Weights   06/20/16 0910 06/20/16 1812  Weight: 84.4 kg (186 lb) 83.9 kg (185 lb)    Examination:  General exam: Appears calm and comfortable  Respiratory system: Clear to auscultation. Respiratory effort normal. Cardiovascular system: S1 & S2 heard, RRR. No JVD, murmurs, rubs, gallops or clicks. No pedal edema. Gastrointestinal system: Abdomen is nondistended, soft and nontender. No organomegaly or masses felt. Normal bowel sounds heard. Central nervous system: Alert and oriented. No focal neurological deficits. Extremities: Symmetric 5 x 5 power. Skin: No rashes, lesions or ulcers Psychiatry: Judgement and insight appear normal. Mood & affect appropriate.     Data Reviewed: I have personally reviewed following labs and imaging studies  CBC:  Recent Labs Lab 06/20/16 0957 06/21/16 0411  WBC 13.8* 7.5  NEUTROABS 12.5*  --   HGB 14.5 13.1  HCT 44.6 41.2  MCV 93.9 94.7  PLT 163 672   Basic Metabolic Panel:  Recent Labs Lab 06/20/16 0957 06/21/16 0411  NA 139 138  K 4.9 3.7  CL 104 108  CO2 26 22  GLUCOSE 116* 101*  BUN 34* 21*  CREATININE 1.08* 0.93  CALCIUM 9.8 9.3   GFR: Estimated Creatinine Clearance: 66.8 mL/min (by C-G formula based on SCr of 0.93 mg/dL). Liver Function Tests:  Recent Labs Lab 06/20/16 0957 06/21/16 0411  AST 29 25  ALT 22 17  ALKPHOS 82 63  BILITOT 0.7 0.6  PROT 7.8 6.8    ALBUMIN 4.4 3.7    Recent Labs Lab 06/20/16 0957 06/21/16 0411  LIPASE 302* 17   No results for input(s): AMMONIA in the last 168 hours. Coagulation Profile: No results for input(s): INR, PROTIME in the last 168 hours. Cardiac Enzymes: No results for input(s): CKTOTAL, CKMB, CKMBINDEX, TROPONINI in the last 168 hours. BNP (last 3 results) No results for input(s): PROBNP in the last 8760 hours. HbA1C: No results for input(s): HGBA1C in the last 72 hours. CBG: No results for input(s): GLUCAP in the last 168 hours. Lipid Profile: No results for input(s): CHOL, HDL, LDLCALC, TRIG, CHOLHDL, LDLDIRECT in the last 72 hours. Thyroid Function Tests:  Recent Labs  06/20/16 1330  TSH 0.841   Anemia Panel: No results for input(s): VITAMINB12, FOLATE, FERRITIN, TIBC, IRON, RETICCTPCT in the last 72 hours. Sepsis Labs: No results for input(s): PROCALCITON, LATICACIDVEN in the last 168 hours.  No results found for this or any previous visit (from the past 240 hour(s)).       Radiology Studies: US Abdomen Complete  Result Date: 06/20/2016 CLINICAL DATA:  Abdominal pain, nausea/ vomiting/diarrhea EXAM: ABDOMEN ULTRASOUND COMPLETE COMPARISON:  None. FINDINGS: Gallbladder: Multiple gallstones measuring up to 17 mm. No gallbladder wall thickening or pericholecystic fluid. Common bile duct: Diameter: 7 mm Liver: Hyperechoic hepatic parenchyma. 5.7 cm cyst in the right hepatic lobe. IVC: No abnormality visualized. Pancreas: Visualized portion unremarkable. Spleen: Size and appearance within normal limits. Right Kidney: Length: 9.5 cm.  No mass or hydronephrosis. Left Kidney: Length: 11.4 cm.  No mass or hydronephrosis. Abdominal aorta: No aneurysm visualized. Other findings: None. IMPRESSION: Cholelithiasis, without associated sonographic findings to suggest acute cholecystitis. Hyperechoic hepatic parenchyma, nonspecific but raising the possibility of hepatic steatosis. 5.7 cm benign hepatic  cyst. Electronically Signed   By: Julian Hy M.D.   On: 06/20/2016 13:45        Scheduled Meds: . aspirin EC  81 mg Oral Daily  . famotidine  20 mg Oral Daily  . gabapentin  600 mg Oral BID  . heparin  5,000 Units Subcutaneous Q8H  . levothyroxine  125 mcg Oral QAC breakfast  . sertraline  100 mg Oral Daily  . sodium chloride flush  3 mL Intravenous Q12H   Continuous Infusions: . sodium chloride       LOS: 1 day    Time spent: 23mins    MEMON,JEHANZEB, MD [facility] Pager [phone]  If 7PM-7AM, please contact night-coverage [email or web address] Password [email or web address] 06/21/2016, 1:37 PM

## 2016-06-21 NOTE — Progress Notes (Signed)
Patient was offered the use of hospital CPAP and refused our unit at this time. I informed her and family that at anytime she wished to use our unit to inform us and we'd be happy to give one to use at bedtime. Patient is stable and resting comfortably;will continue to monitor.

## 2016-06-21 NOTE — Care Management Note (Signed)
Case Management Note  Patient Details  Name: [name] MRN: [id number] Date of Birth: Oct 20, 1955  Subjective/Objective:                  Admitted with pancreatitis. Pt from home, lives alone and is ind with ADL's. She has PCP, transportation and insurance with drug coverage. She has no HH or DME needs pta. She drives. She communicates no needs.   Action/Plan: Anticipate DC home with self care. CM will follow to DC, no needs anticipated.    Expected Discharge Date:  06/23/16               Expected Discharge Plan:  Home/Self Care  In-House Referral:  NA  Discharge planning Services  CM Consult  Post Acute Care Choice:  NA Choice offered to:  NA  Status of Service:  Completed, signed off  Sherald Barge, RN 06/21/2016, 10:37 AM

## 2016-06-21 NOTE — Progress Notes (Signed)
[facility] CPAP plugged into red outlet. Pt will call when she is ready to place CPAP

## 2016-06-21 NOTE — Progress Notes (Signed)
Pt up to bathroom to urinate after being bladder scanned d/t not urinating all night. Pt started having diarrhea very badly. Pts BP dropped to 86/31 after being rechecked, Dr. Shanon Brow paged and made aware of low BP and diarrhea. New order for 529ml bolus to be given x1, nothing ordered for diarrhea. Pt and family educated. Will continue to monitor pt

## 2016-06-22 DIAGNOSIS — A0811 Acute gastroenteropathy due to Norwalk agent: Secondary | ICD-10-CM

## 2016-06-22 DIAGNOSIS — K859 Acute pancreatitis without necrosis or infection, unspecified: Secondary | ICD-10-CM

## 2016-06-22 DIAGNOSIS — N182 Chronic kidney disease, stage 2 (mild): Secondary | ICD-10-CM

## 2016-06-22 LAB — GASTROINTESTINAL PANEL BY PCR, STOOL (REPLACES STOOL CULTURE)
Adenovirus F40/41: NOT DETECTED
Astrovirus: NOT DETECTED
CRYPTOSPORIDIUM: NOT DETECTED
Campylobacter species: NOT DETECTED
Cyclospora cayetanensis: NOT DETECTED
ENTEROAGGREGATIVE E COLI (EAEC): NOT DETECTED
Entamoeba histolytica: NOT DETECTED
Enteropathogenic E coli (EPEC): NOT DETECTED
Enterotoxigenic E coli (ETEC): NOT DETECTED
GIARDIA LAMBLIA: NOT DETECTED
NOROVIRUS GI/GII: DETECTED — AB
PLESIMONAS SHIGELLOIDES: NOT DETECTED
ROTAVIRUS A: NOT DETECTED
SALMONELLA SPECIES: NOT DETECTED
SAPOVIRUS (I, II, IV, AND V): NOT DETECTED
SHIGA LIKE TOXIN PRODUCING E COLI (STEC): NOT DETECTED
SHIGELLA/ENTEROINVASIVE E COLI (EIEC): NOT DETECTED
Vibrio cholerae: NOT DETECTED
Vibrio species: NOT DETECTED
Yersinia enterocolitica: NOT DETECTED

## 2016-06-22 LAB — COMPREHENSIVE METABOLIC PANEL
ALBUMIN: 3.2 g/dL — AB (ref 3.5–5.0)
ALK PHOS: 56 U/L (ref 38–126)
ALT: 21 U/L (ref 14–54)
AST: 27 U/L (ref 15–41)
Anion gap: 4 — ABNORMAL LOW (ref 5–15)
BILIRUBIN TOTAL: 0.4 mg/dL (ref 0.3–1.2)
BUN: 15 mg/dL (ref 6–20)
CO2: 23 mmol/L (ref 22–32)
CREATININE: 0.9 mg/dL (ref 0.44–1.00)
Calcium: 9.2 mg/dL (ref 8.9–10.3)
Chloride: 114 mmol/L — ABNORMAL HIGH (ref 101–111)
GFR calc Af Amer: >60 mL/min (ref >60)
GFR calc non Af Amer: >60 mL/min (ref >60)
GLUCOSE: 75 mg/dL (ref 65–99)
Potassium: 4.1 mmol/L (ref 3.5–5.1)
SODIUM: 141 mmol/L (ref 135–145)
Total Protein: 5.8 g/dL — ABNORMAL LOW (ref 6.5–8.1)

## 2016-06-22 LAB — CBC
HCT: 36.3 % (ref 36.0–46.0)
Hemoglobin: 11.5 g/dL — ABNORMAL LOW (ref 12.0–15.0)
MCH: 30.2 pg (ref 26.0–34.0)
MCHC: 31.7 g/dL (ref 30.0–36.0)
MCV: 95.3 fL (ref 78.0–100.0)
Platelets: 132 10*3/uL — ABNORMAL LOW (ref 150–400)
RBC: 3.81 MIL/uL — AB (ref 3.87–5.11)
RDW: 13.4 % (ref 11.5–15.5)
WBC: 4.2 10*3/uL (ref 4.0–10.5)

## 2016-06-22 LAB — LIPASE, BLOOD: Lipase: 14 U/L (ref 11–51)

## 2016-06-22 LAB — HIV ANTIBODY (ROUTINE TESTING W REFLEX): HIV SCREEN 4TH GENERATION: NONREACTIVE

## 2016-06-22 MED ORDER — HYDROCODONE-ACETAMINOPHEN 7.5-325 MG PO TABS: 1.0000 | ORAL_TABLET | Freq: Four times a day (QID) | ORAL | Status: DC | PRN

## 2016-06-22 MED ORDER — HYDROCODONE-ACETAMINOPHEN 7.5-325 MG PO TABS
1.0000 | ORAL_TABLET | Freq: Four times a day (QID) | ORAL | Status: DC | PRN
  Administered 2016-06-22 – 2016-06-23 (×2): 1 via ORAL
  Filled 2016-06-22 (×2): qty 1

## 2016-06-22 NOTE — Progress Notes (Signed)
Notified by lab personnel at [facility] that pt GI panel was (+) for Norovirus. MD made aware. Will continue to monitor.

## 2016-06-22 NOTE — Progress Notes (Signed)
Pt placed on [facility] CPAP for sleep. Family at bedside. Pt tolerating well

## 2016-06-22 NOTE — Progress Notes (Signed)
PROGRESS NOTE    [name]  SAY:[id number] DOB: November 14, 1955 DOA: 06/20/2016 PCP: Glenda Chroman, MD    Brief Narrative:  61 year old female presented to the hospital with abdominal pain, found to have pancreatitis which is likely related to gallstones. She's been admitted for supportive management with IV fluids, pain management and bowel rest.   Assessment & Plan:   Principal Problem:   Acute pancreatitis Active Problems:   S/P cardiac cath, no evidence of CAD 09/08/13   Hypothyroidism   CKD (chronic kidney disease) stage 2, GFR 60-89 ml/min   OSA on CPAP   Nonischemic cardiomyopathy ([place])   ICD (implantable cardioverter-defibrillator), dual, in situ - St. Jude 01/2014   Enteritis due to Norovirus   1. Acute pancreatitis. Suspect gallstone pancreatitis. She does have evidence of cholelithiasis, but CBD does not appear dilated. Overall lipase has improved. She possibly passed a stone. Abdominal pain is improving. Will advance to clear liquids. Ultimately, she will likely need a cholecystectomy, but this can be pursued as an outpatient. She has been referred to [facility] surgery for an outpatient evaluation.  2. History of nonischemic cardiomyopathy with EF of 10-15% in the past status post AICD. Most recent echocardiogram from 2016 shows a normal ejection fraction. Hold beta blocker for now since blood pressure is soft. 3. Enteritis due to norovirus. Stool for C. difficile negative. GI pathogen panel positive for norovirus. Continue supportive management.. 4. Obstructive sleep apnea. Continue on CPAP. 5. Hypothyroidism. Continue on Synthroid. TSH is normal.   DVT prophylaxis: Heparin Code Status: Full code Family Communication: No family at bedside. Disposition Plan: Likely discharge home once improved    Consultants:     Procedures:     Antimicrobials:      Subjective: Had loose stools until last night when she received Imodium. No vomiting. Overall pain  is better.  Objective: Vitals:   06/21/16 2113 06/22/16 0515 06/22/16 0806 06/22/16 1334  BP: (!) 113/56 (!) 118/47 (!) 117/51 (!) 115/40  Pulse: 65 62 65 63  Resp: 18 18 18 18   Temp: 98.2 F (36.8 C) 98.4 F (36.9 C) 98.5 F (36.9 C) 97.7 F (36.5 C)  TempSrc: Oral Oral Oral Oral  SpO2: 98% 97% 100% 98%  Weight:      Height:        Intake/Output Summary (Last 24 hours) at 06/22/16 1857 Last data filed at 06/22/16 1750  Gross per 24 hour  Intake           1212.5 ml  Output             1670 ml  Net           -457.5 ml   Filed Weights   06/20/16 0910 06/20/16 1812  Weight: 84.4 kg (186 lb) 83.9 kg (185 lb)    Examination:  General exam: Alert, awake, oriented x 3 Respiratory system: Clear to auscultation. Respiratory effort normal. Cardiovascular system:RRR. No murmurs, rubs, gallops. Gastrointestinal system: Abdomen is nondistended, soft and nontender. No organomegaly or masses felt. Normal bowel sounds heard. Central nervous system: Alert and oriented. No focal neurological deficits. Extremities: No C/C/E, +pedal pulses Skin: No rashes, lesions or ulcers Psychiatry: Judgement and insight appear normal. Mood & affect appropriate.   Data Reviewed: I have personally reviewed following labs and imaging studies  CBC:  Recent Labs Lab 06/20/16 0957 06/21/16 0411 06/22/16 0443  WBC 13.8* 7.5 4.2  NEUTROABS 12.5*  --   --   HGB 14.5 13.1 11.5*  HCT 44.6 41.2 36.3  MCV 93.9 94.7 95.3  PLT 163 165 962*   Basic Metabolic Panel:  Recent Labs Lab 06/20/16 0957 06/21/16 0411 06/22/16 0443  NA 139 138 141  K 4.9 3.7 4.1  CL 104 108 114*  CO2 26 22 23   GLUCOSE 116* 101* 75  BUN 34* 21* 15  CREATININE 1.08* 0.93 0.90  CALCIUM 9.8 9.3 9.2   GFR: Estimated Creatinine Clearance: 69.1 mL/min (by C-G formula based on SCr of 0.9 mg/dL). Liver Function Tests:  Recent Labs Lab 06/20/16 0957 06/21/16 0411 06/22/16 0443  AST 29 25 27   ALT 22 17 21   ALKPHOS 82  63 56  BILITOT 0.7 0.6 0.4  PROT 7.8 6.8 5.8*  ALBUMIN 4.4 3.7 3.2*    Recent Labs Lab 06/20/16 0957 06/21/16 0411 06/22/16 0443  LIPASE 302* 17 14   No results for input(s): AMMONIA in the last 168 hours. Coagulation Profile: No results for input(s): INR, PROTIME in the last 168 hours. Cardiac Enzymes: No results for input(s): CKTOTAL, CKMB, CKMBINDEX, TROPONINI in the last 168 hours. BNP (last 3 results) No results for input(s): PROBNP in the last 8760 hours. HbA1C: No results for input(s): HGBA1C in the last 72 hours. CBG: No results for input(s): GLUCAP in the last 168 hours. Lipid Profile: No results for input(s): CHOL, HDL, LDLCALC, TRIG, CHOLHDL, LDLDIRECT in the last 72 hours. Thyroid Function Tests:  Recent Labs  06/20/16 1330  TSH 0.841   Anemia Panel: No results for input(s): VITAMINB12, FOLATE, FERRITIN, TIBC, IRON, RETICCTPCT in the last 72 hours. Sepsis Labs: No results for input(s): PROCALCITON, LATICACIDVEN in the last 168 hours.  Recent Results (from the past 240 hour(s))  C difficile quick scan w PCR reflex     Status: None   Collection Time: 06/21/16  1:32 PM  Result Value Ref Range Status   C Diff antigen NEGATIVE NEGATIVE Final   C Diff toxin NEGATIVE NEGATIVE Final   C Diff interpretation No C. difficile detected.  Final  Gastrointestinal Panel by PCR , Stool     Status: Abnormal   Collection Time: 06/21/16  1:32 PM  Result Value Ref Range Status   Campylobacter species NOT DETECTED NOT DETECTED Final   Plesimonas shigelloides NOT DETECTED NOT DETECTED Final   Salmonella species NOT DETECTED NOT DETECTED Final   Yersinia enterocolitica NOT DETECTED NOT DETECTED Final   Vibrio species NOT DETECTED NOT DETECTED Final   Vibrio cholerae NOT DETECTED NOT DETECTED Final   Enteroaggregative E coli (EAEC) NOT DETECTED NOT DETECTED Final   Enteropathogenic E coli (EPEC) NOT DETECTED NOT DETECTED Final   Enterotoxigenic E coli (ETEC) NOT DETECTED  NOT DETECTED Final   Shiga like toxin producing E coli (STEC) NOT DETECTED NOT DETECTED Final   Shigella/Enteroinvasive E coli (EIEC) NOT DETECTED NOT DETECTED Final   Cryptosporidium NOT DETECTED NOT DETECTED Final   Cyclospora cayetanensis NOT DETECTED NOT DETECTED Final   Entamoeba histolytica NOT DETECTED NOT DETECTED Final   Giardia lamblia NOT DETECTED NOT DETECTED Final   Adenovirus F40/41 NOT DETECTED NOT DETECTED Final   Astrovirus NOT DETECTED NOT DETECTED Final   Norovirus GI/GII DETECTED (A) NOT DETECTED Final    Comment: RESULT CALLED TO, READ BACK BY AND VERIFIED WITH: BRITNEY FOLEY 06/22/16 1327 SGD    Rotavirus A NOT DETECTED NOT DETECTED Final   Sapovirus (I, II, IV, and V) NOT DETECTED NOT DETECTED Final         Radiology Studies: No  results found.      Scheduled Meds: . aspirin EC  81 mg Oral Daily  . famotidine  20 mg Oral BID  . gabapentin  600 mg Oral BID  . heparin  5,000 Units Subcutaneous Q8H  . levothyroxine  125 mcg Oral QAC breakfast  . sertraline  100 mg Oral Daily  . sodium chloride flush  3 mL Intravenous Q12H   Continuous Infusions: . sodium chloride 75 mL/hr at 06/22/16 1515     LOS: 2 days    Time spent: 30mins    Tenea Sens, MD [facility] Pager [phone]  If 7PM-7AM, please contact night-coverage [email or web address] Password [facility] 06/22/2016, 6:57 PM

## 2016-06-22 NOTE — Progress Notes (Signed)
Pt is refusing to wear CPAP for sleep. CPAP is at bedside on standby should the pt change her mind. Pt encouraged to wear CPAP but continues to refuse. Pt states that it's too loud and she doesn't wear her CPAP at home on a regular schedule.

## 2016-06-23 MED ORDER — LOPERAMIDE HCL 2 MG PO CAPS: 2.0000 mg | ORAL_CAPSULE | Freq: Four times a day (QID) | ORAL | 0 refills | Status: AC | PRN

## 2016-06-23 NOTE — Progress Notes (Signed)
Patient discharged home.  IV removed - WNL.  Reviewed DC instructions and medications.  No questions at this time.  Follow up appt in place.  Assisted off unit in NAD.

## 2016-06-23 NOTE — Care Management Important Message (Signed)
Important Message  Patient Details  Name: [name] MRN: [id number] Date of Birth: 1955-01-17   Medicare Important Message Given:  Yes    Sherald Barge, RN 06/23/2016, 11:02 AM

## 2016-06-23 NOTE — Discharge Summary (Signed)
Physician Discharge Summary  [name] VOH:[id number] DOB: Jun 08, 1955 DOA: 06/20/2016  PCP: Glenda Chroman, MD  Admit date: 06/20/2016 Discharge date: 06/23/2016  Admitted From: Home Disposition:  Home  Recommendations for Outpatient Follow-up:  1. Follow up with PCP in 1-2 weeks 2. Please obtain BMP/CBC in one week 3. Follow-up with general surgery on 6/25 to discuss possible cholecystectomy.  Discharge Condition: stable CODE STATUS: full code Diet recommendation: Heart Healthy   Brief/Interim Summary: 61 year old female presented to the hospital with abdominal pain, found to have acute pancreatitis which is likely related to gallstones. She's been admitted for supportive management with IV fluids, pain management and bowel rest  Discharge Diagnoses:  Principal Problem:   Acute pancreatitis Active Problems:   S/P cardiac cath, no evidence of CAD 09/08/13   Hypothyroidism   CKD (chronic kidney disease) stage 2, GFR 60-89 ml/min   OSA on CPAP   Nonischemic cardiomyopathy ([place])   ICD (implantable cardioverter-defibrillator), dual, in situ - St. Jude 01/2014   Enteritis due to Norovirus  1. Acute pancreatitis. Suspect gallstone pancreatitis. She does have evidence of cholelithiasis, but CBD does not appear dilated. She was treated with supportive measures including IV fluids, pain management and bowel rest. Overall lipase improved. It is possible that she may have passed a stone. Ultimately she will need to have cholecystectomy. She has been referred to [facility] surgery, since she would like to have surgery done in [place]. Her abdominal pain is now resolved and she is tolerating a solid diet.   2. History of nonischemic cardiomyopathy with EF of 10-15% in the past status post AICD. Most recent echocardiogram from 2016 shows a normal ejection fraction. Resume low-dose beta blocker and ARB on discharge 3. Enteritis due to norovirus. Stool for C. difficile negative. GI  pathogen panel positive for norovirus. Continue supportive management. Diarrhea has since improved. 4. Obstructive sleep apnea. Continue on CPAP. 5. Hypothyroidism. Continue on Synthroid. TSH is normal.  Discharge Instructions  Discharge Instructions    Diet - low sodium heart healthy    Complete by:  As directed    Increase activity slowly    Complete by:  As directed      Allergies as of 06/23/2016      Reactions   Nexium [esomeprazole Magnesium] Swelling, Rash   Nucynta [tapentadol] Itching   Percocet [oxycodone-acetaminophen] Itching   Patient can tolerate acetaminophen      Medication List    TAKE these medications   acetaminophen 325 MG tablet Commonly known as:  TYLENOL Take 2 tablets (650 mg total) by mouth every 6 (six) hours as needed for mild pain or moderate pain.   aspirin 81 MG EC tablet Take 1 tablet (81 mg total) by mouth daily.   BIOTIN PO Take 1 tablet by mouth daily.   cetirizine 10 MG tablet Commonly known as:  ZYRTEC Take 10 mg by mouth daily.   diazepam 5 MG tablet Commonly known as:  VALIUM Take 2.5-5 mg by mouth at bedtime as needed for anxiety.   diclofenac sodium 1 % Gel Commonly known as:  VOLTAREN Apply 1 application topically daily as needed (fibromyalgia).   EPINEPHrine 0.3 mg/0.3 mL Soaj injection Commonly known as:  EPIPEN 2-PAK Inject 0.3 mLs (0.3 mg total) into the muscle once as needed (for severe allergic reaction). CAll 911 immediately if you have to use this medicine   gabapentin 300 MG capsule Commonly known as:  NEURONTIN Take 600 mg by mouth 2 (two) times daily.  HYDROcodone-acetaminophen 7.5-325 MG tablet Commonly known as:  NORCO Take 1 tablet by mouth 3 (three) times daily as needed for moderate pain.   hydrOXYzine 10 MG tablet Commonly known as:  ATARAX/VISTARIL Take 1 tablet (10 mg total) by mouth every 6 (six) hours as needed for itching.   levothyroxine 125 MCG tablet Commonly known as:  SYNTHROID,  LEVOTHROID Take 1 tablet by mouth daily.   loperamide 2 MG capsule Commonly known as:  IMODIUM Take 1 capsule (2 mg total) by mouth 4 (four) times daily as needed for diarrhea or loose stools.   losartan 25 MG tablet Commonly known as:  COZAAR Take 0.5 tablets (12.5 mg total) by mouth daily.   metoprolol succinate 25 MG 24 hr tablet Commonly known as:  TOPROL-XL Take 0.5 tablets (12.5 mg total) by mouth daily.   multivitamin with minerals Tabs tablet Take 1 tablet by mouth daily.   polyethylene glycol packet Commonly known as:  MIRALAX / GLYCOLAX Take 17 g by mouth daily as needed for mild constipation.   promethazine 25 MG tablet Commonly known as:  PHENERGAN Take 25 mg by mouth once.   ranitidine 150 MG tablet Commonly known as:  ZANTAC Take 1 tablet by mouth 2 (two) times daily.   sertraline 100 MG tablet Commonly known as:  ZOLOFT Take 100 mg by mouth daily.   spironolactone 25 MG tablet Commonly known as:  ALDACTONE Take 0.5 tablets (12.5 mg total) by mouth daily. At bedtime   tiZANidine 4 MG tablet Commonly known as:  ZANAFLEX Take 2 mg by mouth 2 (two) times daily as needed for muscle spasms (for fibromyalgia).   torsemide 20 MG tablet Commonly known as:  DEMADEX Take 1 tablet (20 mg total) by mouth daily as needed (FOR SWELLING).   Turmeric 450 MG Caps Take 450 mg by mouth daily.      Follow-up Information    [facility], [place] Follow up on 07/03/2016.   Specialty:  General Surgery Why:  at 3:00 pm Contact information: [address] [place] [address] [phone]          Allergies  Allergen Reactions  . Nexium [Esomeprazole Magnesium] Swelling and Rash  . Nucynta [Tapentadol] Itching  . Percocet [Oxycodone-Acetaminophen] Itching    Patient can tolerate acetaminophen    Consultations:     Procedures/Studies: US Abdomen Complete  Result Date: 06/20/2016 CLINICAL DATA:  Abdominal pain, nausea/ vomiting/diarrhea  EXAM: ABDOMEN ULTRASOUND COMPLETE COMPARISON:  None. FINDINGS: Gallbladder: Multiple gallstones measuring up to 17 mm. No gallbladder wall thickening or pericholecystic fluid. Common bile duct: Diameter: 7 mm Liver: Hyperechoic hepatic parenchyma. 5.7 cm cyst in the right hepatic lobe. IVC: No abnormality visualized. Pancreas: Visualized portion unremarkable. Spleen: Size and appearance within normal limits. Right Kidney: Length: 9.5 cm.  No mass or hydronephrosis. Left Kidney: Length: 11.4 cm.  No mass or hydronephrosis. Abdominal aorta: No aneurysm visualized. Other findings: None. IMPRESSION: Cholelithiasis, without associated sonographic findings to suggest acute cholecystitis. Hyperechoic hepatic parenchyma, nonspecific but raising the possibility of hepatic steatosis. 5.7 cm benign hepatic cyst. Electronically Signed   By: Julian Hy M.D.   On: 06/20/2016 13:45       Subjective: Feeling better. Abdominal pain improved. Nausea and vomiting resolved.  Discharge Exam: Vitals:   06/23/16 0644 06/23/16 1529  BP: (!) 113/56 110/63  Pulse: 62 77  Resp: 18 18  Temp: 98.3 F (36.8 C) 98.1 F (36.7 C)   Vitals:   06/22/16 1334 06/22/16  2149 06/23/16 0644 06/23/16 1529  BP: (!) 115/40 (!) 119/58 (!) 113/56 110/63  Pulse: 63 63 62 77  Resp: 18 19 18 18   Temp: 97.7 F (36.5 C) 98.1 F (36.7 C) 98.3 F (36.8 C) 98.1 F (36.7 C)  TempSrc: Oral Oral Oral Oral  SpO2: 98% 94% 94% 96%  Weight:      Height:        General: Pt is alert, awake, not in acute distress Cardiovascular: RRR, S1/S2 +, no rubs, no gallops Respiratory: CTA bilaterally, no wheezing, no rhonchi Abdominal: Soft, NT, ND, bowel sounds + Extremities: no edema, no cyanosis    The results of significant diagnostics from this hospitalization (including imaging, microbiology, ancillary and laboratory) are listed below for reference.     Microbiology: Recent Results (from the past 240 hour(s))  C difficile quick  scan w PCR reflex     Status: None   Collection Time: 06/21/16  1:32 PM  Result Value Ref Range Status   C Diff antigen NEGATIVE NEGATIVE Final   C Diff toxin NEGATIVE NEGATIVE Final   C Diff interpretation No C. difficile detected.  Final  Gastrointestinal Panel by PCR , Stool     Status: Abnormal   Collection Time: 06/21/16  1:32 PM  Result Value Ref Range Status   Campylobacter species NOT DETECTED NOT DETECTED Final   Plesimonas shigelloides NOT DETECTED NOT DETECTED Final   Salmonella species NOT DETECTED NOT DETECTED Final   Yersinia enterocolitica NOT DETECTED NOT DETECTED Final   Vibrio species NOT DETECTED NOT DETECTED Final   Vibrio cholerae NOT DETECTED NOT DETECTED Final   Enteroaggregative E coli (EAEC) NOT DETECTED NOT DETECTED Final   Enteropathogenic E coli (EPEC) NOT DETECTED NOT DETECTED Final   Enterotoxigenic E coli (ETEC) NOT DETECTED NOT DETECTED Final   Shiga like toxin producing E coli (STEC) NOT DETECTED NOT DETECTED Final   Shigella/Enteroinvasive E coli (EIEC) NOT DETECTED NOT DETECTED Final   Cryptosporidium NOT DETECTED NOT DETECTED Final   Cyclospora cayetanensis NOT DETECTED NOT DETECTED Final   Entamoeba histolytica NOT DETECTED NOT DETECTED Final   Giardia lamblia NOT DETECTED NOT DETECTED Final   Adenovirus F40/41 NOT DETECTED NOT DETECTED Final   Astrovirus NOT DETECTED NOT DETECTED Final   Norovirus GI/GII DETECTED (A) NOT DETECTED Final    Comment: RESULT CALLED TO, READ BACK BY AND VERIFIED WITH: BRITNEY FOLEY 06/22/16 1327 SGD    Rotavirus A NOT DETECTED NOT DETECTED Final   Sapovirus (I, II, IV, and V) NOT DETECTED NOT DETECTED Final     Labs: BNP (last 3 results) No results for input(s): BNP in the last 8760 hours. Basic Metabolic Panel:  Recent Labs Lab 06/20/16 0957 06/21/16 0411 06/22/16 0443  NA 139 138 141  K 4.9 3.7 4.1  CL 104 108 114*  CO2 26 22 23   GLUCOSE 116* 101* 75  BUN 34* 21* 15  CREATININE 1.08* 0.93 0.90   CALCIUM 9.8 9.3 9.2   Liver Function Tests:  Recent Labs Lab 06/20/16 0957 06/21/16 0411 06/22/16 0443  AST 29 25 27   ALT 22 17 21   ALKPHOS 82 63 56  BILITOT 0.7 0.6 0.4  PROT 7.8 6.8 5.8*  ALBUMIN 4.4 3.7 3.2*    Recent Labs Lab 06/20/16 0957 06/21/16 0411 06/22/16 0443  LIPASE 302* 17 14   No results for input(s): AMMONIA in the last 168 hours. CBC:  Recent Labs Lab 06/20/16 0957 06/21/16 0411 06/22/16 0443  WBC 13.8* 7.5  4.2  NEUTROABS 12.5*  --   --   HGB 14.5 13.1 11.5*  HCT 44.6 41.2 36.3  MCV 93.9 94.7 95.3  PLT 163 165 132*   Cardiac Enzymes: No results for input(s): CKTOTAL, CKMB, CKMBINDEX, TROPONINI in the last 168 hours. BNP: Invalid input(s): POCBNP CBG: No results for input(s): GLUCAP in the last 168 hours. D-Dimer No results for input(s): DDIMER in the last 72 hours. Hgb A1c No results for input(s): HGBA1C in the last 72 hours. Lipid Profile No results for input(s): CHOL, HDL, LDLCALC, TRIG, CHOLHDL, LDLDIRECT in the last 72 hours. Thyroid function studies No results for input(s): TSH, T4TOTAL, T3FREE, THYROIDAB in the last 72 hours.  Invalid input(s): FREET3 Anemia work up No results for input(s): VITAMINB12, FOLATE, FERRITIN, TIBC, IRON, RETICCTPCT in the last 72 hours. Urinalysis    Component Value Date/Time   COLORURINE YELLOW 06/20/2016 1145   APPEARANCEUR CLEAR 06/20/2016 1145   LABSPEC 1.020 06/20/2016 1145   PHURINE 6.0 06/20/2016 1145   GLUCOSEU NEGATIVE 06/20/2016 1145   HGBUR NEGATIVE 06/20/2016 1145   BILIRUBINUR NEGATIVE 06/20/2016 1145   KETONESUR NEGATIVE 06/20/2016 1145   PROTEINUR NEGATIVE 06/20/2016 1145   UROBILINOGEN 0.2 01/17/2014 1355   NITRITE NEGATIVE 06/20/2016 1145   LEUKOCYTESUR NEGATIVE 06/20/2016 1145   Sepsis Labs Invalid input(s): PROCALCITONIN,  WBC,  LACTICIDVEN Microbiology Recent Results (from the past 240 hour(s))  C difficile quick scan w PCR reflex     Status: None   Collection Time:  06/21/16  1:32 PM  Result Value Ref Range Status   C Diff antigen NEGATIVE NEGATIVE Final   C Diff toxin NEGATIVE NEGATIVE Final   C Diff interpretation No C. difficile detected.  Final  Gastrointestinal Panel by PCR , Stool     Status: Abnormal   Collection Time: 06/21/16  1:32 PM  Result Value Ref Range Status   Campylobacter species NOT DETECTED NOT DETECTED Final   Plesimonas shigelloides NOT DETECTED NOT DETECTED Final   Salmonella species NOT DETECTED NOT DETECTED Final   Yersinia enterocolitica NOT DETECTED NOT DETECTED Final   Vibrio species NOT DETECTED NOT DETECTED Final   Vibrio cholerae NOT DETECTED NOT DETECTED Final   Enteroaggregative E coli (EAEC) NOT DETECTED NOT DETECTED Final   Enteropathogenic E coli (EPEC) NOT DETECTED NOT DETECTED Final   Enterotoxigenic E coli (ETEC) NOT DETECTED NOT DETECTED Final   Shiga like toxin producing E coli (STEC) NOT DETECTED NOT DETECTED Final   Shigella/Enteroinvasive E coli (EIEC) NOT DETECTED NOT DETECTED Final   Cryptosporidium NOT DETECTED NOT DETECTED Final   Cyclospora cayetanensis NOT DETECTED NOT DETECTED Final   Entamoeba histolytica NOT DETECTED NOT DETECTED Final   Giardia lamblia NOT DETECTED NOT DETECTED Final   Adenovirus F40/41 NOT DETECTED NOT DETECTED Final   Astrovirus NOT DETECTED NOT DETECTED Final   Norovirus GI/GII DETECTED (A) NOT DETECTED Final    Comment: RESULT CALLED TO, READ BACK BY AND VERIFIED WITH: BRITNEY FOLEY 06/22/16 1327 SGD    Rotavirus A NOT DETECTED NOT DETECTED Final   Sapovirus (I, II, IV, and V) NOT DETECTED NOT DETECTED Final     Time coordinating discharge: Over 30 minutes  SIGNED:   Kathie Dike, MD  [facility] 06/23/2016, 6:14 PM Pager   If 7PM-7AM, please contact night-coverage [email or web address] Password [email or web address]

## 2016-06-28 ENCOUNTER — Ambulatory Visit: Payer: Medicare HMO | Admitting: Nutrition

## 2016-07-03 ENCOUNTER — Other Ambulatory Visit: Payer: Self-pay | Admitting: General Surgery

## 2016-07-14 ENCOUNTER — Encounter: Payer: Self-pay | Admitting: Cardiology

## 2016-07-14 ENCOUNTER — Ambulatory Visit (INDEPENDENT_AMBULATORY_CARE_PROVIDER_SITE_OTHER): Payer: Medicare HMO | Admitting: Cardiology

## 2016-07-14 VITALS — BP 98/72 | HR 60 | Ht 63.0 in | Wt 186.0 lb

## 2016-07-14 DIAGNOSIS — I5022 Chronic systolic (congestive) heart failure: Secondary | ICD-10-CM | POA: Diagnosis not present

## 2016-07-14 DIAGNOSIS — Z01810 Encounter for preprocedural cardiovascular examination: Secondary | ICD-10-CM | POA: Diagnosis not present

## 2016-07-14 NOTE — Progress Notes (Signed)
Clinical Summary Ms. Barnhart is a 61 y.o.female seen today for follow up of the following medical problems.   1. Chronic systolic HF/NICM - new diagnosis during admit 08/2013, LVEF 15-20%, grade II diastolic dysfunction, moderate TR with PASP 57. Post CRT-D placement LVEF increased to 45-50%.  - cath 09/08/13 patent coronaries with normal filling pressures - echo 12/2014 LVEF 08-65%, grade I diastolic dysfunction - CRT-D placed Jan 2016 by Dr Caryl Comes - medical therapy limited by soft bp's - was started on spironolactone, had severe dizziness and discontinued. ACE-I changed to ARB  by Dr Caryl Comes due to cough.    - no recent chest pain, no SOB. Occasional LE edema.  - walks on treadmill 3 times, walks up to 45 minutes slow to brisk walk without troubles.  - normal device check 06/2016  2. Pancreatitis - admission 06/2016 with acute pancreatitis, suspected due to gallstones.  - being considered for gallbladder surgery    SH: son admitted to [facility], foot ulcers.  Past Medical History:  Diagnosis Date  . Anxiety   . Asthma   . At risk for sudden cardiac death, life vest    . Bronchitis   . Chronic systolic HF (heart failure) ([place])    a. NICM b. RHC (09/08/13): RA: 2, RV 26/0/2, PA: 25/9 (15), PCWP: 9, Fick CO/CI: 4.6 / 2.5, PA 68% c. ECHO (08/2013) EF 20-25%, grade II DD, RV sys fx mildly reduced, mod TR  . Depression   . Fibromyalgia   . Hypothyroidism   . ICD (implantable cardioverter-defibrillator), dual, in situ 01/12/14   St. Jude ; for primary prevention for EF<35%  . Left bundle Taevin Mcferran block   . NICM (nonischemic cardiomyopathy) ([place])    a. LHC (08/2013): no angiographic evidence of CAD     Allergies  Allergen Reactions  . Nexium [Esomeprazole Magnesium] Swelling and Rash  . Nucynta [Tapentadol] Itching  . Percocet [Oxycodone-Acetaminophen] Itching    Patient can tolerate acetaminophen     Current Outpatient Prescriptions  Medication Sig Dispense Refill  .  acetaminophen (TYLENOL) 325 MG tablet Take 2 tablets (650 mg total) by mouth every 6 (six) hours as needed for mild pain or moderate pain.    Marland Kitchen aspirin EC 81 MG EC tablet Take 1 tablet (81 mg total) by mouth daily.    Marland Kitchen BIOTIN PO Take 1 tablet by mouth daily.    . cetirizine (ZYRTEC) 10 MG tablet Take 10 mg by mouth daily.    . diazepam (VALIUM) 5 MG tablet Take 2.5-5 mg by mouth at bedtime as needed for anxiety.    . diclofenac sodium (VOLTAREN) 1 % GEL Apply 1 application topically daily as needed (fibromyalgia).    . EPINEPHrine (EPIPEN 2-PAK) 0.3 mg/0.3 mL IJ SOAJ injection Inject 0.3 mLs (0.3 mg total) into the muscle once as needed (for severe allergic reaction). CAll 911 immediately if you have to use this medicine 1 Device 1  . gabapentin (NEURONTIN) 300 MG capsule Take 600 mg by mouth 2 (two) times daily.     Marland Kitchen HYDROcodone-acetaminophen (NORCO) 7.5-325 MG per tablet Take 1 tablet by mouth 3 (three) times daily as needed for moderate pain.    . hydrOXYzine (ATARAX/VISTARIL) 10 MG tablet Take 1 tablet (10 mg total) by mouth every 6 (six) hours as needed for itching. 20 tablet 0  . levothyroxine (SYNTHROID, LEVOTHROID) 125 MCG tablet Take 1 tablet by mouth daily.    Marland Kitchen loperamide (IMODIUM) 2 MG capsule Take 1  capsule (2 mg total) by mouth 4 (four) times daily as needed for diarrhea or loose stools. 30 capsule 0  . losartan (COZAAR) 25 MG tablet Take 0.5 tablets (12.5 mg total) by mouth daily. 45 tablet 3  . metoprolol succinate (TOPROL-XL) 25 MG 24 hr tablet Take 0.5 tablets (12.5 mg total) by mouth daily. 45 tablet 3  . Multiple Vitamin (MULTIVITAMIN WITH MINERALS) TABS tablet Take 1 tablet by mouth daily.    . polyethylene glycol (MIRALAX / GLYCOLAX) packet Take 17 g by mouth daily as needed for mild constipation.     . promethazine (PHENERGAN) 25 MG tablet Take 25 mg by mouth once.    . ranitidine (ZANTAC) 150 MG tablet Take 1 tablet by mouth 2 (two) times daily.    . sertraline (ZOLOFT)  100 MG tablet Take 100 mg by mouth daily.    Marland Kitchen spironolactone (ALDACTONE) 25 MG tablet Take 0.5 tablets (12.5 mg total) by mouth daily. At bedtime 45 tablet 3  . tiZANidine (ZANAFLEX) 4 MG tablet Take 2 mg by mouth 2 (two) times daily as needed for muscle spasms (for fibromyalgia).     . torsemide (DEMADEX) 20 MG tablet Take 1 tablet (20 mg total) by mouth daily as needed (FOR SWELLING). 30 tablet 6  . Turmeric 450 MG CAPS Take 450 mg by mouth daily.     No current facility-administered medications for this visit.      Past Surgical History:  Procedure Laterality Date  . ABDOMINAL HYSTERECTOMY    . BI-VENTRICULAR IMPLANTABLE CARDIOVERTER DEFIBRILLATOR N/A 01/12/2014   Procedure: BI-VENTRICULAR IMPLANTABLE CARDIOVERTER DEFIBRILLATOR  (CRT-D);  Surgeon: Deboraha Sprang, MD;  Location: [facility] CATH LAB;  Service: Cardiovascular;  Laterality: N/A;  . LAPAROSCOPIC GASTRIC BANDING    . LEFT AND RIGHT HEART CATHETERIZATION WITH CORONARY ANGIOGRAM N/A 09/08/2013   Procedure: LEFT AND RIGHT HEART CATHETERIZATION WITH CORONARY ANGIOGRAM;  Surgeon: Burnell Blanks, MD;  Location: [facility] CATH LAB;  Service: Cardiovascular;  Laterality: N/A;  . SHOULDER OPEN ROTATOR CUFF REPAIR Left      Allergies  Allergen Reactions  . Nexium [Esomeprazole Magnesium] Swelling and Rash  . Nucynta [Tapentadol] Itching  . Percocet [Oxycodone-Acetaminophen] Itching    Patient can tolerate acetaminophen      Family History  Problem Relation Age of Onset  . Hypertension Mother        deceased: adenocarcinoma, HLD  . Cancer Father        deceased: basal cell carcinoma  . Cancer Sister        breast  . Cancer Brother        skin  . Hyperlipidemia Brother      Social History Ms. Belen reports that she quit smoking about 27 years ago. Her smoking use included Cigarettes. She started smoking about 44 years ago. She has a 16.00 pack-year smoking history. She has never used smokeless tobacco. Ms. Pilkington reports  that she drinks alcohol.   Review of Systems CONSTITUTIONAL: No weight loss, fever, chills, weakness or fatigue.  HEENT: Eyes: No visual loss, blurred vision, double vision or yellow sclerae.No hearing loss, sneezing, congestion, runny nose or sore throat.  SKIN: No rash or itching.  CARDIOVASCULAR: per hpi RESPIRATORY: No shortness of breath, cough or sputum.  GASTROINTESTINAL: No anorexia, nausea, vomiting or diarrhea. No abdominal pain or blood.  GENITOURINARY: No burning on urination, no polyuria NEUROLOGICAL: No headache, dizziness, syncope, paralysis, ataxia, numbness or tingling in the extremities. No change in bowel or bladder control.  MUSCULOSKELETAL: No muscle, back pain, joint pain or stiffness.  LYMPHATICS: No enlarged nodes. No history of splenectomy.  PSYCHIATRIC: No history of depression or anxiety.  ENDOCRINOLOGIC: No reports of sweating, cold or heat intolerance. No polyuria or polydipsia.  Marland Kitchen   Physical Examination Vitals:   07/14/16 1121  BP: 98/72  Pulse: 60   Vitals:   07/14/16 1121  Weight: 186 lb (84.4 kg)  Height: 5\' 3"  (1.6 m)    Gen: resting comfortably, no acute distress HEENT: no scleral icterus, pupils equal round and reactive, no palptable cervical adenopathy,  CV: RRR, no m/r/g, no jvd Resp: Clear to auscultation bilaterally GI: abdomen is soft, non-tender, non-distended, normal bowel sounds, no hepatosplenomegaly MSK: extremities are warm, no edema.  Skin: warm, no rash Neuro:  no focal deficits Psych: appropriate affect   Diagnostic Studies 09/08/13 Cath Hemodynamic Findings: Ao: 94/66  LV: 93/4/8  RA: 2 RV: 26/0/2  PA: 25/9 (mean 15)  PCWP: 9  Fick Cardiac Output: 4.6 L/min  Fick Cardiac Index: 2.5 L/min/m2  Central Aortic Saturation: 96%  Pulmonary Artery Saturation: 68%  Angiographic Findings:  Left main: No obstructive disease.  Left Anterior Descending Artery: Large caliber vessel that courses to the apex.  There are two large caliber diagonal branches. No obstructive disease.  Circumflex Artery: Large caliber vessel with a small caliber first obtuse marginal Vrishank Moster and a large caliber bifurcating second obtuse marginal Diasia Henken. No obstructive disease.  Right Coronary Artery: Large dominant vessel with no obstructive disease.  Left Ventricular Angiogram: Deferred.  Impression:  1. No angiographic evidence of CAD  2. Normal filling pressures.  3. Non-ischemic cardiomyopathy  Recommendations: Continue medical management of her non-ischemic cardiomyopathy. Continue beta blocker. Would add Ace-inh as BP tolerates.  Complications: None; patient tolerated the procedure well.  08/2013 Echo Study Conclusions  - Left ventricle: There is diffuse hypokinesis with akinesis of the entire anterior, basal and mid anterolateral walls and paradoxical septal motion. The cavity size was severely dilated. Systolic function was severely reduced. The estimated ejection fraction was in the range of 15-20%. Features are consistent with a pseudonormal left ventricular filling pattern, with concomitant abnormal relaxation and increased filling pressure (grade 2 diastolic dysfunction). Doppler parameters are consistent with elevated ventricular end-diastolic filling pressure. - Aortic valve: Trileaflet; normal thickness leaflets. There was no regurgitation. - Aortic root: The aortic root was normal in size. - Left atrium: The atrium was mildly dilated. - Right ventricle: Systolic function was mildly reduced. - Right atrium: The atrium was normal in size. - Tricuspid valve: There was moderate regurgitation. - Pulmonary arteries: Systolic pressure was moderately to severely increased. PA peak pressure: 57 mm Hg (S).  Impressions:  - Severe dilatation of the left ventricle with severely decreased LVEF 15-20%. Elevated filling pressures. Regional wall motion abnormalities consistent with LBBB  and suggestive of infarct in LAD territory. Moderate mitral and tricuspid regurgitation. Mild right ventricular systolic impairement and moderate to severe pulmonary hypertension.    Assessment and Plan  1. Chronic systolic HF - appears euvolemic, no recent symptoms. LVEF has normalized - medical therapy limited by soft bp's - continue current meds  2. Preop evaluation - she tolerates greater than 4 METs regularly without limitation - no active acute cardiac conditions - recommend proceding with gallbladder surgery       Arnoldo Lenis, M.D

## 2016-07-14 NOTE — Patient Instructions (Signed)

## 2016-07-20 ENCOUNTER — Other Ambulatory Visit: Payer: Self-pay | Admitting: General Surgery

## 2016-07-20 DIAGNOSIS — K851 Biliary acute pancreatitis without necrosis or infection: Secondary | ICD-10-CM

## 2016-07-27 ENCOUNTER — Ambulatory Visit
Admission: RE | Admit: 2016-07-27 | Discharge: 2016-07-27 | Disposition: A | Discharge status: Home / Self Care | Payer: Medicare HMO | Source: Ambulatory Visit | Attending: General Surgery | Admitting: General Surgery

## 2016-07-27 ENCOUNTER — Other Ambulatory Visit: Payer: Self-pay | Admitting: General Surgery

## 2016-07-27 DIAGNOSIS — K851 Biliary acute pancreatitis without necrosis or infection: Secondary | ICD-10-CM

## 2016-08-05 ENCOUNTER — Other Ambulatory Visit: Payer: Self-pay | Admitting: General Surgery

## 2016-08-25 ENCOUNTER — Encounter (HOSPITAL_COMMUNITY)
Admission: RE | Admit: 2016-08-25 | Discharge: 2016-08-25 | Disposition: A | Discharge status: Home / Self Care | Payer: Medicare HMO | Source: Ambulatory Visit | Attending: General Surgery | Admitting: General Surgery

## 2016-08-25 ENCOUNTER — Encounter (HOSPITAL_COMMUNITY): Payer: Self-pay

## 2016-08-25 DIAGNOSIS — K851 Biliary acute pancreatitis without necrosis or infection: Secondary | ICD-10-CM | POA: Diagnosis not present

## 2016-08-25 DIAGNOSIS — Z01818 Encounter for other preprocedural examination: Secondary | ICD-10-CM | POA: Insufficient documentation

## 2016-08-25 HISTORY — DX: Chronic obstructive pulmonary disease, unspecified: J44.9

## 2016-08-25 LAB — COMPREHENSIVE METABOLIC PANEL
ALT: 24 U/L (ref 14–54)
ANION GAP: 6 (ref 5–15)
AST: 30 U/L (ref 15–41)
Albumin: 4.2 g/dL (ref 3.5–5.0)
Alkaline Phosphatase: 78 U/L (ref 38–126)
BUN: 28 mg/dL — ABNORMAL HIGH (ref 6–20)
CHLORIDE: 105 mmol/L (ref 101–111)
CO2: 27 mmol/L (ref 22–32)
CREATININE: 1.2 mg/dL — AB (ref 0.44–1.00)
Calcium: 10.6 mg/dL — ABNORMAL HIGH (ref 8.9–10.3)
GFR calc non Af Amer: 48 mL/min — ABNORMAL LOW (ref >60)
GFR, EST AFRICAN AMERICAN: 56 mL/min — AB (ref >60)
Glucose, Bld: 91 mg/dL (ref 65–99)
POTASSIUM: 5.1 mmol/L (ref 3.5–5.1)
SODIUM: 138 mmol/L (ref 135–145)
Total Bilirubin: 0.5 mg/dL (ref 0.3–1.2)
Total Protein: 7.1 g/dL (ref 6.5–8.1)

## 2016-08-25 LAB — LIPASE, BLOOD: Lipase: 37 U/L (ref 11–51)

## 2016-08-25 LAB — CBC WITH DIFFERENTIAL/PLATELET
BASOS ABS: 0 10*3/uL (ref 0.0–0.1)
Basophils Relative: 1 %
EOS ABS: 0.4 10*3/uL (ref 0.0–0.7)
EOS PCT: 6 %
HCT: 40.9 % (ref 36.0–46.0)
Hemoglobin: 13.1 g/dL (ref 12.0–15.0)
LYMPHS ABS: 2.4 10*3/uL (ref 0.7–4.0)
Lymphocytes Relative: 35 %
MCH: 29.9 pg (ref 26.0–34.0)
MCHC: 32 g/dL (ref 30.0–36.0)
MCV: 93.4 fL (ref 78.0–100.0)
Monocytes Absolute: 0.6 10*3/uL (ref 0.1–1.0)
Monocytes Relative: 9 %
Neutro Abs: 3.4 10*3/uL (ref 1.7–7.7)
Neutrophils Relative %: 49 %
PLATELETS: 169 10*3/uL (ref 150–400)
RBC: 4.38 MIL/uL (ref 3.87–5.11)
RDW: 13.5 % (ref 11.5–15.5)
WBC: 6.8 10*3/uL (ref 4.0–10.5)

## 2016-08-25 MED ORDER — CHLORHEXIDINE GLUCONATE CLOTH 2 % EX PADS: 6.0000 | MEDICATED_PAD | Freq: Once | CUTANEOUS | Status: DC

## 2016-08-25 NOTE — Pre-Procedure Instructions (Signed)
[name]  08/25/2016      [facility] - [facility] [facility], [place] [address] [place] [address] Phone: [phone] Fax: [place] [place] [address] Phone: [phone] Fax: [phone]    Your procedure is scheduled on August 22  Report to [place] at [place].M.  Call this number if you have problems the morning of surgery:  [phone]   Remember:  Do not eat food or drink liquids after midnight.  Continue all other medications as directed by your physician except follow these instructions about you medications  Please complete your 8oz of Boost Breeze or Water that was given to you at your preadmission appointment by 0545 on the day of your surgery   Take these medicines the morning of surgery with A SIP OF WATER acetaminophen (TYLENOL),  diazepam (VALIUM), gabapentin (NEURONTIN) , HYDROcodone-acetaminophen (NORCO) , levothyroxine (SYNTHROID, LEVOTHROID), metoprolol succinate (TOPROL-XL) , ranitidine (ZANTAC), sertraline (ZOLOFT) ,   7 days prior to surgery STOP taking any Aleve, Naproxen, Ibuprofen, Motrin, Advil, Goody's, BC's, all herbal medications, fish oil, and all vitamins  Follow your doctors instructions regarding your Aspirin.  If no instructions were given by the doctor you will need to call the office to get instructions.  Your pre admission RN will also call for those instructions    Do not wear jewelry, make-up or nail polish.  Do not wear lotions, powders, or perfumes, or deoderant.  Do not shave 48 hours prior to surgery.  Men may shave face and neck.  Do not bring valuables to the hospital.  [facility] is not responsible for any belongings or valuables.  Contacts, dentures or bridgework may not be worn into surgery.  Leave your suitcase in the car.  After surgery it may be brought to your room.  For  patients admitted to the hospital, discharge time will be determined by your treatment team.  Patients discharged the day of surgery will not be allowed to drive home.    Special instructions:   [place]- Preparing For Surgery  Before surgery, you can play an important role. Because skin is not sterile, your skin needs to be as free of germs as possible. You can reduce the number of germs on your skin by washing with CHG (chlorahexidine gluconate) Soap before surgery.  CHG is an antiseptic cleaner which kills germs and bonds with the skin to continue killing germs even after washing.  Please do not use if you have an allergy to CHG or antibacterial soaps. If your skin becomes reddened/irritated stop using the CHG.  Do not shave (including legs and underarms) for at least 48 hours prior to first CHG shower. It is OK to shave your face.  Please follow these instructions carefully.   1. Shower the NIGHT BEFORE SURGERY and the MORNING OF SURGERY with CHG.   2. If you chose to wash your hair, wash your hair first as usual with your normal shampoo.  3. After you shampoo, rinse your hair and body thoroughly to remove the shampoo.  4. Use CHG as you would any other liquid soap. You can apply CHG directly to the skin and wash gently with a scrungie or a clean washcloth.   5. Apply the CHG Soap to your body ONLY FROM THE NECK DOWN.  Do not use on open wounds or  open sores. Avoid contact with your eyes, ears, mouth and genitals (private parts). Wash genitals (private parts) with your normal soap.  6. Wash thoroughly, paying special attention to the area where your surgery will be performed.  7. Thoroughly rinse your body with warm water from the neck down.  8. DO NOT shower/wash with your normal soap after using and rinsing off the CHG Soap.  9. Pat yourself dry with a CLEAN TOWEL.   10. Wear CLEAN PAJAMAS   11. Place CLEAN SHEETS on your bed the night of your first shower and DO NOT SLEEP  WITH PETS.    Day of Surgery: Do not apply any deodorants/lotions. Please wear clean clothes to the hospital/surgery center.      Please read over the following fact sheets that you were given.

## 2016-08-25 NOTE — Progress Notes (Signed)
icd form faxed and [name] notified with [facility].

## 2016-08-28 NOTE — Progress Notes (Signed)
Anesthesia Chart Review: Patient is a 61 year old female scheduled for laparoscopic cholecystectomy on 08/30/2016 by Dr. Fanny Skates.  History includes former smoker, gallstone pancreatitis (hospitalized 6/12-6/15/18), hypothyroidism, fibromyalgia, asthma, anxiety, chronic systolic CHF (diagnosed 03/5454, EF 15-20%, last EF 12/25/14 55-60%), non-ischemic cardiomyopathy (etiology unclear), left BBB, St. Jude ICD (CRT-D) 01/12/14, COPD, dyspnea, OSA (CPAP), hysterectomy, laparoscopic gastric banding, tonsillectomy. BMI is consistent with obesity.  - PCP is Dr. Jerene Bears. - Cardiologist is Dr. Carlyle Dolly, last visit 07/14/16. Medical therapy for chronic systolic HF is limited due to "soft bp's". In regards to surgery, he wrote: Preop evaluation - she tolerates greater than 4 METs regularly without limitation - no active acute cardiac conditions - recommend proceding with gallbladder surgery - EP cardiologist is Dr. Virl Axe, last office visit 09/09/15. He was going to refer her to Dr. Rayann Heman since she preferred to be seen at their [facility] clinic. Last ICD interrogation 06/14/16 (remote transmission).  Meds include ASA 81 mg, Liptior, Zyrtec, Valium, Neurontin, Norco, levothyroxine, losartan, Toprol XL, Zantac, Zoloft, Aldactone, Zanaflex, torsemide, turmeric.  BP (!) 107/47   Pulse 84   Temp (!) 36.4 C   Resp 20   Ht 5\' 3"  (1.6 m)   Wt 181 lb 14.4 oz (82.5 kg)   SpO2 97%   BMI 32.22 kg/m   EKG 06/20/16: Atrial-sensed ventricular paced rhythm.  Echo 12/25/14: Study Conclusions - Left ventricle: The cavity size was normal. Wall thickness was   normal. Systolic function was normal. The estimated ejection   fraction was in the range of 55% to 60%. Wall motion was normal;   there were no regional wall motion abnormalities. Doppler   parameters are consistent with abnormal left ventricular   relaxation (grade 1 diastolic dysfunction). - Aortic valve: There was trivial regurgitation. -  Mitral valve: There was mild regurgitation. - Pulmonary arteries: Systolic pressure was mildly increased. PA   peak pressure: 31 mm Hg (S). Impressions: - Normal LV systolic function; grade 1 diastolic dysfunction; mild   MR; trace TR; mildly elevated pulmonary pressure.  RHC/LHC 09/08/13: Impression: 1. No angiographic evidence of CAD 2. Normal filling pressures.   3. Non-ischemic cardiomyopathy Recommendations: Continue medical management of her non-ischemic cardiomyopathy. Continue beta blocker. Would add Ace-inh as BP tolerates.   Preoperative labs noted. BUN 28, Cr 1.20, Ca 10.6. CBC WNL. Glucose 91. LFTs WNL.  If not acute changes then I anticipate that she can proceed as planned. Per ICD perioperative device RX form, industry should disable tachy therapies and set to asynchronous pacing during procedure return to normal program following the procedure.  George Hugh [facility]/Anesthesiology Phone [phone] 08/28/2016 1:02 PM

## 2016-08-28 NOTE — H&P (Signed)
[name] Location: [facility] Surgery Patient #: [id number] DOB: 1955-01-18 Single / Language: Undefined / Race: White Female        History of Present Illness The patient is a 61 year old female who presents for evaluation of gall stones. This patient returns with her husband to discuss gallbladder surgery. She is scheduled for laparoscopic cholecystectomy with cholangiogram on August 22 she returns for a preop conference. Her PCP is Dr. Woody Seller. Her cardiologist is Dr. Harl Bowie. Her electrophysiologist is Dr. Caryl Comes.  I initially evaluated her own July 03, 2016. She has chronic regurgitation and some heartburn and belching. She was admitted to [facility] on June 12 with epigastric pain and nausea and vomiting after a meal. Lipase was in the 300's. TSH and LFTs were normal. WBC was 13,000. Ultrasound showed some gallstones. CBD 7 mm. No inflammation. C. difficile was negative. She was discharged on June 15.  Comorbidities reveal lap band placed in [place] 10 years ago. Some adjustments. Has lost 40 pounds. She was questioning the lap band so we did an upper GI that showed the lap band to be in good position. She is not having any vomiting. She has not lost any weight lately.. She has nonischemic cardiomyopathy with EF of 15% followed by cardiology. She has an AICD and pacemaker in the left infraclavicular area. Cardiac catheterization 2015 without coronary artery disease. NICM. Depression. Fibromyalgia. CK D2. Obstructive sleep apnea on CPAP. She was told she had a norovirus infection. Family history positive for mother dying of small cell lung cancer. Brother died of bladder cancer and had neck cancer and lung cancer. Social history reveals she is a widow. She has a female friend with her today. 2 sons. Retired. Denies alcohol or tobacco.  She has been cleared for surgery from a cardiac standpoint She is scheduled for laparoscopic cholecystectomy on  August 22 Once again I discussed the indications, details, techniques and numerous risk of surgery with her and her female friend. This was detailed in my last note. If possible, I'm going to involve one of my bariatric surgeon colleagues to help me with the case because of the lap band being in place. Doubt that this will be a big problem, however. She understands all these issues. All of her questions are answered.   Allergies  NexIUM *ULCER DRUGS*  Rash, Swelling. No Known Allergies 08/11/2016 (Marked as Inactive) Nucynta *ANALGESICS - OPIOID*  Itching. Percocet *ANALGESICS - OPIOID*  Itching.  Medication History  Gabapentin (300MG  Capsule, Oral) Active. Polyethylene Glycol 3350 (Oral) Active. Acetaminophen (325MG  Tablet, Oral) Active. Aspirin EC (81MG  Tablet DR, Oral) Active. Biotin (Oral) Specific strength unknown - Active. Cetirizine HCl (10MG  Tablet, Oral) Active. Diclofenac Sodium (1% Cream, Transdermal) Active. EpiPen 2-Pak (0.3MG /0.3ML Soln Auto-inj, Injection) Active. HydrOXYzine HCl (10MG  Tablet, Oral) Active. Loperamide HCl (2MG  Tablet, Oral) Active. Multi Vitamin/Minerals (Oral) Active. Torsemide (20MG  Tablet, Oral) Active. Turmeric (450MG  Capsule, Oral) Active. Hydrocodone-Acetaminophen (7.5-325MG  Tablet, Oral) Active. DiazePAM (5MG  Tablet, Oral) Active. Levothyroxine Sodium (112MCG Tablet, Oral) Active. Metoprolol Succinate ER (25MG  Tablet ER 24HR, Oral) Active. TiZANidine HCl (4MG  Tablet, Oral) Active. Losartan Potassium (25MG  Tablet, Oral) Active. Atorvastatin Calcium (20MG  Tablet, Oral) Active. RaNITidine HCl (150MG  Tablet, Oral) Active. Sertraline HCl (100MG  Tablet, Oral) Active. Spironolactone (25MG  Tablet, Oral) Active. Medications Reconciled  Vitals Weight: 181.8 lb Height: 63in Body Surface Area: 1.86 m Body Mass Index: 32.2 kg/m  Temp.: 98.48F  Pulse: 71 (Regular)  BP: 100/70 (Sitting, Left Arm,  Standard)   Physical Exam  General Mental Status-Alert. General Appearance-Not in acute distress. Build & Nutrition-Well nourished. Posture-Normal posture. Gait-Normal. Note: Very pleasant. Female friend with her throughout the encounter. BMI 32.   Head and Neck Head-normocephalic, atraumatic with no lesions or palpable masses. Trachea-midline. Thyroid Gland Characteristics - normal size and consistency and no palpable nodules.  Chest and Lung Exam Chest and lung exam reveals -on auscultation, normal breath sounds, no adventitious sounds and normal vocal resonance.  Cardiovascular Cardiovascular examination reveals -normal heart sounds, regular rate and rhythm with no murmurs and femoral artery auscultation bilaterally reveals normal pulses, no bruits, no thrills.  Abdomen Inspection Inspection of the abdomen reveals - No Hernias. Palpation/Percussion Palpation and Percussion of the abdomen reveal - Soft, Non Tender, No Rigidity (guarding), No hepatosplenomegaly and No Palpable abdominal masses. Note: LAP-BAND port is palpable to the right of the umbilicus. Skin healthy. Multiple trocar and port sites well-healed. No hernias or masses.   Neurologic Neurologic evaluation reveals -alert and oriented x 3 with no impairment of recent or remote memory, normal attention span and ability to concentrate, normal sensation and normal coordination.  Musculoskeletal Normal Exam - Bilateral-Upper Extremity Strength Normal and Lower Extremity Strength Normal.    Assessment & Plan  ACUTE BILIARY PANCREATITIS, UNSPECIFIED COMPLICATION STATUS (X83.29)   You have seen Dr. Harl Bowie, and he has approved proceeding with gallbladder surgery from a cardiac standpoint. You have had a barium upper GI and it shows that the lap band is in good position  You're scheduled for laparoscopic cholecystectomy at [facility] on August 22 Once again we discussed the indications,  techniques, and risk of the surgery in detail Dr. Dalbert Batman will speak to you the morning of surgery prior to surgery  LAP-BAND SURGERY STATUS (Z98.84)  NONISCHEMIC CARDIOMYOPATHY (I42.8)  AICD (AUTOMATIC CARDIOVERTER/DEFIBRILLATOR) PRESENT (Z95.810)  FIBROMYALGIA (M79.7) DEPRESSION, CONTROLLED (F32.9) CKD (CHRONIC KIDNEY DISEASE), SYMPTOM MANAGEMENT ONLY, STAGE 2 (MILD) (N18.2) OBSTRUCTIVE SLEEP APNEA TREATED WITH CONTINUOUS POSITIVE AIRWAY PRESSURE (CPAP) (G47.33) INFECTION DUE TO NOROVIRUS SPECIES (A08.11)   Devine Klingel M. Dalbert Batman, M.D., [facility] General and Minimally invasive Surgery Breast and Colorectal Surgery Office:   [phone] Pager:   [phone]

## 2016-08-29 MED ORDER — DEXTROSE 5 % IV SOLN
3.0000 g | INTRAVENOUS | Status: AC
  Administered 2016-08-30: 2 g via INTRAVENOUS
  Filled 2016-08-29: qty 3000

## 2016-08-29 NOTE — Anesthesia Preprocedure Evaluation (Signed)
Anesthesia Evaluation  Patient identified by MRN, date of birth, ID band Patient awake    Reviewed: Allergy & Precautions, NPO status , Patient's Chart, lab work & pertinent test results  Airway Mallampati: II  TM Distance: >3 FB Neck ROM: Full    Dental no notable dental hx.    Pulmonary neg pulmonary ROS, former smoker,    Pulmonary exam normal breath sounds clear to auscultation       Cardiovascular +CHF  negative cardio ROS Normal cardiovascular exam+ dysrhythmias Supra Ventricular Tachycardia + pacemaker  Rhythm:Regular Rate:Normal     Neuro/Psych PSYCHIATRIC DISORDERS Anxiety Depression negative neurological ROS  negative psych ROS   GI/Hepatic negative GI ROS, Neg liver ROS,   Endo/Other  negative endocrine ROSHypothyroidism   Renal/GU negative Renal ROS  negative genitourinary   Musculoskeletal negative musculoskeletal ROS (+) Fibromyalgia -  Abdominal   Peds negative pediatric ROS (+)  Hematology negative hematology ROS (+)   Anesthesia Other Findings Echo 12/25/14:- Left ventricle: The cavity size was normal. Wall thickness was normal. Systolic function was normal. The estimated ejection fraction was in the range of 55% to 60%. Wall motion was normal; there were no regional wall motion abnormalities. Doppler parameters are consistent with abnormal left ventricular relaxation (grade 1 diastolic dysfunction). - Aortic valve: There was trivial regurgitation. - Mitral valve: There was mild regurgitation. - Pulmonary arteries: Systolic pressure was mildly increased. PA peak pressure: 31 mm Hg (S). Impressions: - Normal LV systolic function; grade 1 diastolic dysfunction; mild MR; trace TR; mildly elevated pulmonary pressure.  RHC/LHC 09/08/13: Impression: 1. No angiographic evidence of CAD 2. Normal filling pressures.  3. Non-ischemic cardiomyopathy  ICD (implantable  cardioverter-defibrillator), dual, in situ 01/12/14  EKG Atrial-sensed ventricular-paced rhythm No further analysis attempted due to paced rhythm  Reproductive/Obstetrics negative OB ROS                             Anesthesia Physical Anesthesia Plan  ASA: III  Anesthesia Plan: MAC   Post-op Pain Management:    Induction: Intravenous  PONV Risk Score and Plan: 2 and Ondansetron, Dexamethasone, Treatment may vary due to age or medical condition and Midazolam  Airway Management Planned: Mask, Natural Airway and Nasal Cannula  Additional Equipment:   Intra-op Plan:   Post-operative Plan:   Informed Consent:   Plan Discussed with:   Anesthesia Plan Comments:         Anesthesia Quick Evaluation

## 2016-08-30 ENCOUNTER — Ambulatory Visit (HOSPITAL_COMMUNITY): Payer: Medicare HMO

## 2016-08-30 ENCOUNTER — Observation Stay (HOSPITAL_COMMUNITY)
Admission: RE | Admit: 2016-08-30 | Discharge: 2016-08-31 | Disposition: A | Discharge status: Home / Self Care | Payer: Medicare HMO | Source: Ambulatory Visit | Attending: General Surgery | Admitting: General Surgery

## 2016-08-30 ENCOUNTER — Ambulatory Visit (HOSPITAL_COMMUNITY): Payer: Medicare HMO | Admitting: Anesthesiology

## 2016-08-30 ENCOUNTER — Ambulatory Visit (HOSPITAL_COMMUNITY): Payer: Medicare HMO | Admitting: Vascular Surgery

## 2016-08-30 ENCOUNTER — Encounter (HOSPITAL_COMMUNITY): Payer: Self-pay | Admitting: Surgery

## 2016-08-30 ENCOUNTER — Encounter (HOSPITAL_COMMUNITY)
Admission: RE | Disposition: A | Discharge status: Home / Self Care | Payer: Self-pay | Source: Ambulatory Visit | Attending: General Surgery

## 2016-08-30 DIAGNOSIS — K851 Biliary acute pancreatitis without necrosis or infection: Secondary | ICD-10-CM | POA: Diagnosis present

## 2016-08-30 DIAGNOSIS — Z950 Presence of cardiac pacemaker: Secondary | ICD-10-CM | POA: Diagnosis not present

## 2016-08-30 DIAGNOSIS — Z79899 Other long term (current) drug therapy: Secondary | ICD-10-CM | POA: Diagnosis not present

## 2016-08-30 DIAGNOSIS — E039 Hypothyroidism, unspecified: Secondary | ICD-10-CM | POA: Insufficient documentation

## 2016-08-30 DIAGNOSIS — F419 Anxiety disorder, unspecified: Secondary | ICD-10-CM | POA: Diagnosis not present

## 2016-08-30 DIAGNOSIS — Z95810 Presence of automatic (implantable) cardiac defibrillator: Secondary | ICD-10-CM | POA: Diagnosis not present

## 2016-08-30 DIAGNOSIS — Z9049 Acquired absence of other specified parts of digestive tract: Secondary | ICD-10-CM

## 2016-08-30 DIAGNOSIS — I428 Other cardiomyopathies: Secondary | ICD-10-CM | POA: Diagnosis not present

## 2016-08-30 DIAGNOSIS — Z9689 Presence of other specified functional implants: Secondary | ICD-10-CM | POA: Insufficient documentation

## 2016-08-30 DIAGNOSIS — Z6832 Body mass index (BMI) 32.0-32.9, adult: Secondary | ICD-10-CM | POA: Insufficient documentation

## 2016-08-30 DIAGNOSIS — Z7982 Long term (current) use of aspirin: Secondary | ICD-10-CM | POA: Diagnosis not present

## 2016-08-30 DIAGNOSIS — Z9989 Dependence on other enabling machines and devices: Secondary | ICD-10-CM | POA: Diagnosis not present

## 2016-08-30 DIAGNOSIS — M797 Fibromyalgia: Secondary | ICD-10-CM | POA: Diagnosis not present

## 2016-08-30 DIAGNOSIS — Z4589 Encounter for adjustment and management of other implanted devices: Secondary | ICD-10-CM | POA: Insufficient documentation

## 2016-08-30 DIAGNOSIS — F329 Major depressive disorder, single episode, unspecified: Secondary | ICD-10-CM | POA: Diagnosis not present

## 2016-08-30 DIAGNOSIS — N182 Chronic kidney disease, stage 2 (mild): Secondary | ICD-10-CM | POA: Diagnosis not present

## 2016-08-30 DIAGNOSIS — G4733 Obstructive sleep apnea (adult) (pediatric): Secondary | ICD-10-CM | POA: Diagnosis not present

## 2016-08-30 DIAGNOSIS — Z419 Encounter for procedure for purposes other than remedying health state, unspecified: Secondary | ICD-10-CM

## 2016-08-30 DIAGNOSIS — Z87891 Personal history of nicotine dependence: Secondary | ICD-10-CM | POA: Insufficient documentation

## 2016-08-30 DIAGNOSIS — I509 Heart failure, unspecified: Secondary | ICD-10-CM | POA: Insufficient documentation

## 2016-08-30 DIAGNOSIS — K8010 Calculus of gallbladder with chronic cholecystitis without obstruction: Principal | ICD-10-CM | POA: Insufficient documentation

## 2016-08-30 HISTORY — DX: Unspecified osteoarthritis, unspecified site: M19.90

## 2016-08-30 HISTORY — DX: Dorsalgia, unspecified: M54.9

## 2016-08-30 HISTORY — PX: LAPAROSCOPIC CHOLECYSTECTOMY: SUR755

## 2016-08-30 HISTORY — DX: Other chronic pain: G89.29

## 2016-08-30 HISTORY — DX: Gastro-esophageal reflux disease without esophagitis: K21.9

## 2016-08-30 HISTORY — DX: Chronic kidney disease, stage 2 (mild): N18.2

## 2016-08-30 HISTORY — DX: Pneumonia, unspecified organism: J18.9

## 2016-08-30 HISTORY — DX: Obstructive sleep apnea (adult) (pediatric): G47.33

## 2016-08-30 HISTORY — DX: Unspecified malignant neoplasm of skin of unspecified part of face: C44.300

## 2016-08-30 HISTORY — DX: Unspecified chronic bronchitis: J42

## 2016-08-30 HISTORY — DX: Dependence on other enabling machines and devices: Z99.89

## 2016-08-30 HISTORY — PX: CHOLECYSTECTOMY: SHX55

## 2016-08-30 SURGERY — LAPAROSCOPIC CHOLECYSTECTOMY WITH INTRAOPERATIVE CHOLANGIOGRAM
Anesthesia: General | Site: Abdomen

## 2016-08-30 MED ORDER — DEXAMETHASONE SODIUM PHOSPHATE 10 MG/ML IJ SOLN
INTRAMUSCULAR | Status: AC
  Filled 2016-08-30: qty 3

## 2016-08-30 MED ORDER — SODIUM CHLORIDE 0.9% FLUSH: 3.0000 mL | Freq: Two times a day (BID) | INTRAVENOUS | Status: DC

## 2016-08-30 MED ORDER — SPIRONOLACTONE 25 MG PO TABS: 12.5000 mg | ORAL_TABLET | Freq: Every day | ORAL | Status: DC

## 2016-08-30 MED ORDER — ACETAMINOPHEN 650 MG RE SUPP: 650.0000 mg | RECTAL | Status: DC | PRN

## 2016-08-30 MED ORDER — HEMOSTATIC AGENTS (NO CHARGE) OPTIME
TOPICAL | Status: DC | PRN
  Administered 2016-08-30: 1 via TOPICAL

## 2016-08-30 MED ORDER — ROCURONIUM BROMIDE 100 MG/10ML IV SOLN
INTRAVENOUS | Status: DC | PRN
  Administered 2016-08-30: 10 mg via INTRAVENOUS
  Administered 2016-08-30: 40 mg via INTRAVENOUS

## 2016-08-30 MED ORDER — PHENYLEPHRINE HCL 10 MG/ML IJ SOLN
INTRAMUSCULAR | Status: DC | PRN
  Administered 2016-08-30 (×3): 80 ug via INTRAVENOUS

## 2016-08-30 MED ORDER — LIDOCAINE 2% (20 MG/ML) 5 ML SYRINGE
INTRAMUSCULAR | Status: AC
  Filled 2016-08-30: qty 5

## 2016-08-30 MED ORDER — SUGAMMADEX SODIUM 200 MG/2ML IV SOLN
INTRAVENOUS | Status: DC | PRN
  Administered 2016-08-30: 200 mg via INTRAVENOUS

## 2016-08-30 MED ORDER — MIDAZOLAM HCL 5 MG/5ML IJ SOLN
INTRAMUSCULAR | Status: DC | PRN
  Administered 2016-08-30: 2 mg via INTRAVENOUS

## 2016-08-30 MED ORDER — BUPIVACAINE-EPINEPHRINE (PF) 0.5% -1:200000 IJ SOLN
INTRAMUSCULAR | Status: AC
  Filled 2016-08-30: qty 30

## 2016-08-30 MED ORDER — TIZANIDINE HCL 2 MG PO TABS
2.0000 mg | ORAL_TABLET | Freq: Three times a day (TID) | ORAL | Status: DC | PRN
  Administered 2016-08-30: 4 mg via ORAL
  Filled 2016-08-30: qty 2

## 2016-08-30 MED ORDER — METOPROLOL SUCCINATE ER 25 MG PO TB24: 12.5000 mg | ORAL_TABLET | Freq: Every day | ORAL | Status: DC

## 2016-08-30 MED ORDER — MIDAZOLAM HCL 2 MG/2ML IJ SOLN
INTRAMUSCULAR | Status: AC
  Filled 2016-08-30: qty 2

## 2016-08-30 MED ORDER — PHENYLEPHRINE 40 MCG/ML (10ML) SYRINGE FOR IV PUSH (FOR BLOOD PRESSURE SUPPORT)
PREFILLED_SYRINGE | INTRAVENOUS | Status: AC
  Filled 2016-08-30: qty 10

## 2016-08-30 MED ORDER — FAMOTIDINE 20 MG PO TABS
20.0000 mg | ORAL_TABLET | Freq: Two times a day (BID) | ORAL | Status: DC
  Administered 2016-08-30: 20 mg via ORAL
  Filled 2016-08-30: qty 1

## 2016-08-30 MED ORDER — ROCURONIUM BROMIDE 10 MG/ML (PF) SYRINGE
PREFILLED_SYRINGE | INTRAVENOUS | Status: AC
  Filled 2016-08-30: qty 5

## 2016-08-30 MED ORDER — SERTRALINE HCL 100 MG PO TABS: 100.0000 mg | ORAL_TABLET | Freq: Every day | ORAL | Status: DC

## 2016-08-30 MED ORDER — FENTANYL CITRATE (PF) 100 MCG/2ML IJ SOLN
INTRAMUSCULAR | Status: DC | PRN
Start: 2016-08-30 — End: 2016-08-30
  Administered 2016-08-30: 50 ug via INTRAVENOUS
  Administered 2016-08-30: 100 ug via INTRAVENOUS
  Administered 2016-08-30: 50 ug via INTRAVENOUS

## 2016-08-30 MED ORDER — SODIUM CHLORIDE 0.9 % IJ SOLN
INTRAMUSCULAR | Status: AC
  Filled 2016-08-30: qty 10

## 2016-08-30 MED ORDER — DEXAMETHASONE SODIUM PHOSPHATE 10 MG/ML IJ SOLN
INTRAMUSCULAR | Status: DC | PRN
  Administered 2016-08-30: 10 mg via INTRAVENOUS

## 2016-08-30 MED ORDER — ASPIRIN EC 81 MG PO TBEC: 81.0000 mg | DELAYED_RELEASE_TABLET | Freq: Every day | ORAL | Status: DC

## 2016-08-30 MED ORDER — ATORVASTATIN CALCIUM 20 MG PO TABS: 20.0000 mg | ORAL_TABLET | Freq: Every day | ORAL | Status: DC

## 2016-08-30 MED ORDER — RANITIDINE HCL 150 MG PO TABS
150.0000 mg | ORAL_TABLET | Freq: Two times a day (BID) | ORAL | Status: DC
  Filled 2016-08-30: qty 1

## 2016-08-30 MED ORDER — HYDROCODONE-ACETAMINOPHEN 5-325 MG PO TABS
1.0000 | ORAL_TABLET | ORAL | Status: DC | PRN
  Administered 2016-08-30 – 2016-08-31 (×3): 2 via ORAL
  Filled 2016-08-30 (×3): qty 2

## 2016-08-30 MED ORDER — ONDANSETRON HCL 4 MG/2ML IJ SOLN: INTRAMUSCULAR | Status: DC | PRN

## 2016-08-30 MED ORDER — ADULT MULTIVITAMIN W/MINERALS CH: 1.0000 | ORAL_TABLET | Freq: Every day | ORAL | Status: DC

## 2016-08-30 MED ORDER — SODIUM CHLORIDE 0.9 % IV SOLN: 250.0000 mL | INTRAVENOUS | Status: DC | PRN

## 2016-08-30 MED ORDER — ONDANSETRON HCL 4 MG/2ML IJ SOLN: 4.0000 mg | INTRAMUSCULAR | Status: DC | PRN

## 2016-08-30 MED ORDER — ACETAMINOPHEN 325 MG PO TABS: 650.0000 mg | ORAL_TABLET | ORAL | Status: DC | PRN

## 2016-08-30 MED ORDER — SODIUM CHLORIDE 0.9% FLUSH: 3.0000 mL | INTRAVENOUS | Status: DC | PRN

## 2016-08-30 MED ORDER — CETIRIZINE HCL 10 MG PO TABS
10.0000 mg | ORAL_TABLET | Freq: Every day | ORAL | Status: DC
  Administered 2016-08-30: 10 mg via ORAL
  Filled 2016-08-30: qty 1

## 2016-08-30 MED ORDER — SODIUM CHLORIDE 0.9 % IR SOLN
Status: DC | PRN
  Administered 2016-08-30: 1000 mL

## 2016-08-30 MED ORDER — FENTANYL CITRATE (PF) 100 MCG/2ML IJ SOLN
INTRAMUSCULAR | Status: AC
Start: 2016-08-30 — End: 2016-08-31
  Filled 2016-08-30: qty 2

## 2016-08-30 MED ORDER — HYDROXYZINE HCL 10 MG PO TABS
10.0000 mg | ORAL_TABLET | Freq: Four times a day (QID) | ORAL | Status: DC | PRN
  Filled 2016-08-30: qty 1

## 2016-08-30 MED ORDER — IOPAMIDOL (ISOVUE-300) INJECTION 61%
INTRAVENOUS | Status: AC
  Filled 2016-08-30: qty 50

## 2016-08-30 MED ORDER — 0.9 % SODIUM CHLORIDE (POUR BTL) OPTIME
TOPICAL | Status: DC | PRN
  Administered 2016-08-30: 1000 mL

## 2016-08-30 MED ORDER — POLYETHYLENE GLYCOL 3350 17 G PO PACK: 17.0000 g | PACK | Freq: Every day | ORAL | Status: DC | PRN

## 2016-08-30 MED ORDER — DIAZEPAM 5 MG PO TABS: 2.5000 mg | ORAL_TABLET | Freq: Two times a day (BID) | ORAL | Status: DC | PRN

## 2016-08-30 MED ORDER — ONDANSETRON HCL 4 MG/2ML IJ SOLN
INTRAMUSCULAR | Status: DC | PRN
  Administered 2016-08-30: 4 mg via INTRAVENOUS

## 2016-08-30 MED ORDER — LOPERAMIDE HCL 2 MG PO CAPS: 2.0000 mg | ORAL_CAPSULE | Freq: Four times a day (QID) | ORAL | Status: DC | PRN

## 2016-08-30 MED ORDER — PROMETHAZINE HCL 25 MG PO TABS: 25.0000 mg | ORAL_TABLET | Freq: Every day | ORAL | Status: DC | PRN

## 2016-08-30 MED ORDER — RISAQUAD PO CAPS: 1.0000 | ORAL_CAPSULE | Freq: Every day | ORAL | Status: DC

## 2016-08-30 MED ORDER — FENTANYL CITRATE (PF) 100 MCG/2ML IJ SOLN: 25.0000 ug | INTRAMUSCULAR | Status: DC | PRN

## 2016-08-30 MED ORDER — GABAPENTIN 300 MG PO CAPS: 600.0000 mg | ORAL_CAPSULE | Freq: Three times a day (TID) | ORAL | Status: DC

## 2016-08-30 MED ORDER — HYDROCODONE-ACETAMINOPHEN 5-325 MG PO TABS: 1.0000 | ORAL_TABLET | Freq: Four times a day (QID) | ORAL | 0 refills | Status: DC | PRN

## 2016-08-30 MED ORDER — PROPOFOL 10 MG/ML IV BOLUS
INTRAVENOUS | Status: DC | PRN
  Administered 2016-08-30: 120 mg via INTRAVENOUS

## 2016-08-30 MED ORDER — SODIUM CHLORIDE 0.9 % IV SOLN
INTRAVENOUS | Status: DC | PRN
  Administered 2016-08-30: 13 mL

## 2016-08-30 MED ORDER — MEPERIDINE HCL 25 MG/ML IJ SOLN: 6.2500 mg | INTRAMUSCULAR | Status: DC | PRN

## 2016-08-30 MED ORDER — TORSEMIDE 20 MG PO TABS: 20.0000 mg | ORAL_TABLET | Freq: Every day | ORAL | Status: DC | PRN

## 2016-08-30 MED ORDER — LACTATED RINGERS IV SOLN
INTRAVENOUS | Status: DC
  Administered 2016-08-30 (×2): via INTRAVENOUS

## 2016-08-30 MED ORDER — SUCCINYLCHOLINE CHLORIDE 200 MG/10ML IV SOSY
PREFILLED_SYRINGE | INTRAVENOUS | Status: AC
  Filled 2016-08-30: qty 10

## 2016-08-30 MED ORDER — FENTANYL CITRATE (PF) 100 MCG/2ML IJ SOLN
INTRAMUSCULAR | Status: AC
  Filled 2016-08-30: qty 2

## 2016-08-30 MED ORDER — SUGAMMADEX SODIUM 200 MG/2ML IV SOLN
INTRAVENOUS | Status: AC
  Filled 2016-08-30: qty 2

## 2016-08-30 MED ORDER — LACTATED RINGERS IV SOLN
INTRAVENOUS | Status: DC
  Administered 2016-08-30: 17:00:00 via INTRAVENOUS

## 2016-08-30 MED ORDER — LEVOTHYROXINE SODIUM 112 MCG PO TABS
112.0000 ug | ORAL_TABLET | Freq: Every day | ORAL | Status: DC
  Administered 2016-08-31: 112 ug via ORAL
  Filled 2016-08-30: qty 1

## 2016-08-30 MED ORDER — ONDANSETRON HCL 4 MG/2ML IJ SOLN: 4.0000 mg | Freq: Once | INTRAMUSCULAR | Status: DC | PRN

## 2016-08-30 MED ORDER — LACTATED RINGERS IV SOLN
INTRAVENOUS | Status: DC
  Administered 2016-08-31: 04:00:00 via INTRAVENOUS

## 2016-08-30 MED ORDER — HYDROMORPHONE HCL 1 MG/ML IJ SOLN
1.0000 mg | INTRAMUSCULAR | Status: DC | PRN
  Administered 2016-08-30: 1 mg via INTRAVENOUS
  Filled 2016-08-30: qty 1

## 2016-08-30 MED ORDER — SPIRONOLACTONE 25 MG PO TABS
12.5000 mg | ORAL_TABLET | Freq: Every day | ORAL | Status: DC
  Administered 2016-08-30: 12.5 mg via ORAL
  Filled 2016-08-30: qty 1

## 2016-08-30 MED ORDER — FENTANYL CITRATE (PF) 100 MCG/2ML IJ SOLN
25.0000 ug | INTRAMUSCULAR | Status: DC | PRN
  Administered 2016-08-30 (×5): 25 ug via INTRAVENOUS

## 2016-08-30 MED ORDER — BUPIVACAINE-EPINEPHRINE 0.5% -1:200000 IJ SOLN
INTRAMUSCULAR | Status: DC | PRN
  Administered 2016-08-30: 10 mL

## 2016-08-30 MED ORDER — PROPOFOL 10 MG/ML IV BOLUS
INTRAVENOUS | Status: AC
  Filled 2016-08-30: qty 20

## 2016-08-30 MED ORDER — LOSARTAN POTASSIUM 25 MG PO TABS
12.5000 mg | ORAL_TABLET | Freq: Every day | ORAL | Status: DC
  Filled 2016-08-30: qty 0.5

## 2016-08-30 MED ORDER — OXYCODONE HCL 5 MG PO TABS
5.0000 mg | ORAL_TABLET | ORAL | Status: DC | PRN
  Administered 2016-08-30: 10 mg via ORAL
  Filled 2016-08-30: qty 2

## 2016-08-30 MED ORDER — GABAPENTIN 300 MG PO CAPS
600.0000 mg | ORAL_CAPSULE | Freq: Three times a day (TID) | ORAL | Status: DC
  Administered 2016-08-30: 600 mg via ORAL
  Filled 2016-08-30: qty 2

## 2016-08-30 MED ORDER — LIDOCAINE HCL (CARDIAC) 20 MG/ML IV SOLN
INTRAVENOUS | Status: DC | PRN
  Administered 2016-08-30: 60 mg via INTRAVENOUS

## 2016-08-30 MED ORDER — DOCUSATE SODIUM 100 MG PO CAPS: 200.0000 mg | ORAL_CAPSULE | Freq: Every evening | ORAL | Status: DC | PRN

## 2016-08-30 MED ORDER — ONDANSETRON HCL 4 MG/2ML IJ SOLN
INTRAMUSCULAR | Status: AC
  Filled 2016-08-30: qty 6

## 2016-08-30 MED ORDER — FENTANYL CITRATE (PF) 250 MCG/5ML IJ SOLN
INTRAMUSCULAR | Status: AC
  Filled 2016-08-30: qty 5

## 2016-08-30 SURGICAL SUPPLY — 48 items
APPLICATOR ARISTA FLEXITIP XL (MISCELLANEOUS) ×3 IMPLANT
APPLIER CLIP ROT 10 11.4 M/L (STAPLE) ×3
BENZOIN TINCTURE PRP APPL 2/3 (GAUZE/BANDAGES/DRESSINGS) ×3 IMPLANT
CANISTER SUCT 3000ML PPV (MISCELLANEOUS) ×3 IMPLANT
CHLORAPREP W/TINT 26ML (MISCELLANEOUS) ×3 IMPLANT
CLIP APPLIE ROT 10 11.4 M/L (STAPLE) ×1 IMPLANT
CLOSURE STERI-STRIP 1/2X4 (GAUZE/BANDAGES/DRESSINGS) ×1
CLSR STERI-STRIP ANTIMIC 1/2X4 (GAUZE/BANDAGES/DRESSINGS) ×2 IMPLANT
COVER MAYO STAND STRL (DRAPES) ×3 IMPLANT
COVER SURGICAL LIGHT HANDLE (MISCELLANEOUS) ×3 IMPLANT
DECANTER SPIKE VIAL GLASS SM (MISCELLANEOUS) ×3 IMPLANT
DERMABOND ADVANCED (GAUZE/BANDAGES/DRESSINGS) ×2
DERMABOND ADVANCED .7 DNX12 (GAUZE/BANDAGES/DRESSINGS) ×1 IMPLANT
DRAPE C-ARM 42X72 X-RAY (DRAPES) ×3 IMPLANT
DRSG TEGADERM 2-3/8X2-3/4 SM (GAUZE/BANDAGES/DRESSINGS) ×3 IMPLANT
ELECT REM PT RETURN 9FT ADLT (ELECTROSURGICAL) ×3
ELECTRODE REM PT RTRN 9FT ADLT (ELECTROSURGICAL) ×1 IMPLANT
GAUZE SPONGE 2X2 8PLY STRL LF (GAUZE/BANDAGES/DRESSINGS) ×1 IMPLANT
GLOVE BIO SURGEON STRL SZ 6 (GLOVE) ×3 IMPLANT
GLOVE BIO SURGEON STRL SZ 6.5 (GLOVE) ×2 IMPLANT
GLOVE BIO SURGEONS STRL SZ 6.5 (GLOVE) ×1
GLOVE BIOGEL PI IND STRL 6.5 (GLOVE) ×3 IMPLANT
GLOVE BIOGEL PI INDICATOR 6.5 (GLOVE) ×6
GLOVE EUDERMIC 7 POWDERFREE (GLOVE) ×3 IMPLANT
GLOVE SURG SS PI 6.5 STRL IVOR (GLOVE) ×3 IMPLANT
GOWN STRL REUS W/ TWL LRG LVL3 (GOWN DISPOSABLE) ×4 IMPLANT
GOWN STRL REUS W/ TWL XL LVL3 (GOWN DISPOSABLE) ×1 IMPLANT
GOWN STRL REUS W/TWL LRG LVL3 (GOWN DISPOSABLE) ×8
GOWN STRL REUS W/TWL XL LVL3 (GOWN DISPOSABLE) ×2
KIT BASIN OR (CUSTOM PROCEDURE TRAY) ×3 IMPLANT
KIT ROOM TURNOVER OR (KITS) ×3 IMPLANT
NS IRRIG 1000ML POUR BTL (IV SOLUTION) ×3 IMPLANT
PAD ARMBOARD 7.5X6 YLW CONV (MISCELLANEOUS) ×3 IMPLANT
POUCH SPECIMEN RETRIEVAL 10MM (ENDOMECHANICALS) ×3 IMPLANT
SCISSORS LAP 5X35 DISP (ENDOMECHANICALS) ×3 IMPLANT
SET CHOLANGIOGRAPH 5 50 .035 (SET/KITS/TRAYS/PACK) ×3 IMPLANT
SET IRRIG TUBING LAPAROSCOPIC (IRRIGATION / IRRIGATOR) ×3 IMPLANT
SLEEVE ENDOPATH XCEL 5M (ENDOMECHANICALS) ×3 IMPLANT
SPECIMEN JAR SMALL (MISCELLANEOUS) ×3 IMPLANT
SPONGE GAUZE 2X2 STER 10/PKG (GAUZE/BANDAGES/DRESSINGS) ×2
SUT MNCRL AB 4-0 PS2 18 (SUTURE) ×3 IMPLANT
TOWEL OR 17X24 6PK STRL BLUE (TOWEL DISPOSABLE) ×3 IMPLANT
TOWEL OR 17X26 10 PK STRL BLUE (TOWEL DISPOSABLE) ×3 IMPLANT
TRAY LAPAROSCOPIC MC (CUSTOM PROCEDURE TRAY) ×3 IMPLANT
TROCAR XCEL BLUNT TIP 100MML (ENDOMECHANICALS) ×3 IMPLANT
TROCAR XCEL NON-BLD 11X100MML (ENDOMECHANICALS) ×3 IMPLANT
TROCAR XCEL NON-BLD 5MMX100MML (ENDOMECHANICALS) ×3 IMPLANT
TUBING INSUFFLATION (TUBING) ×3 IMPLANT

## 2016-08-30 NOTE — Anesthesia Postprocedure Evaluation (Signed)
Anesthesia Post Note  Patient: YASENIA REEDY  Procedure(s) Performed: Procedure(s) (LRB): LAPAROSCOPIC CHOLECYSTECTOMY WITH INTRAOPERATIVE CHOLANGIOGRAM (N/A)     Patient location during evaluation: PACU Anesthesia Type: General Level of consciousness: awake and alert Pain management: pain level controlled Vital Signs Assessment: post-procedure vital signs reviewed and stable Respiratory status: spontaneous breathing, nonlabored ventilation, respiratory function stable and patient connected to nasal cannula oxygen Cardiovascular status: blood pressure returned to baseline and stable Postop Assessment: no signs of nausea or vomiting Anesthetic complications: no    Last Vitals:  Vitals:   08/30/16 0803 08/30/16 1155  BP: (!) 101/34   Pulse: 64   Resp: 18   Temp: 36.6 C (!) 36.2 C  SpO2: 97%     Last Pain:  Vitals:   08/30/16 0813  PainSc: 6                  Chesley Veasey

## 2016-08-30 NOTE — Interval H&P Note (Signed)
History and Physical Interval Note:  08/30/2016 7:59 AM  [name]  has presented today for surgery, with the diagnosis of gallstone pancreatitis  The various methods of treatment have been discussed with the patient and family. After consideration of risks, benefits and other options for treatment, the patient has consented to  Procedure(s): LAPAROSCOPIC CHOLECYSTECTOMY WITH INTRAOPERATIVE CHOLANGIOGRAM (N/A) as a surgical intervention .  The patient's history has been reviewed, patient examined, no change in status, stable for surgery.  I have reviewed the patient's chart and labs.  Questions were answered to the patient's satisfaction.     Adin Hector

## 2016-08-30 NOTE — Anesthesia Procedure Notes (Signed)
Procedure Name: Intubation Date/Time: 08/30/2016 10:18 AM Performed by: Candis Shine Pre-anesthesia Checklist: Patient identified, Emergency Drugs available, Suction available and Patient being monitored Patient Re-evaluated:Patient Re-evaluated prior to induction Oxygen Delivery Method: Circle System Utilized Preoxygenation: Pre-oxygenation with 100% oxygen Induction Type: IV induction Ventilation: Mask ventilation without difficulty Laryngoscope Size: Mac and 3 Grade View: Grade I Tube type: Oral Tube size: 7.0 mm Number of attempts: 1 Airway Equipment and Method: Stylet Placement Confirmation: ETT inserted through vocal cords under direct vision,  positive ETCO2 and breath sounds checked- equal and bilateral Secured at: 22 cm Tube secured with: Tape Dental Injury: Teeth and Oropharynx as per pre-operative assessment

## 2016-08-30 NOTE — Transfer of Care (Signed)
Immediate Anesthesia Transfer of Care Note  Patient: [name]  Procedure(s) Performed: Procedure(s): LAPAROSCOPIC CHOLECYSTECTOMY WITH INTRAOPERATIVE CHOLANGIOGRAM (N/A)  Patient Location: PACU  Anesthesia Type:General  Level of Consciousness: awake, alert , oriented and patient cooperative  Airway & Oxygen Therapy: Patient Spontanous Breathing and Patient connected to nasal cannula oxygen  Post-op Assessment: Report given to RN and Post -op Vital signs reviewed and stable  Post vital signs: Reviewed and stable  Last Vitals:  Vitals:   08/30/16 0803  BP: (!) 101/34  Pulse: 64  Resp: 18  Temp: 36.6 C  SpO2: 97%    Last Pain:  Vitals:   08/30/16 0813  PainSc: 6          Complications: No apparent anesthesia complications

## 2016-08-30 NOTE — Discharge Instructions (Signed)
[facility] ______[facility] °LAPAROSCOPIC SURGERY: POST OP INSTRUCTIONS °Always review your discharge instruction sheet given to you by the facility where your surgery was performed. °IF YOU HAVE DISABILITY OR FAMILY LEAVE FORMS, YOU MUST BRING THEM TO THE OFFICE FOR PROCESSING.   °DO NOT GIVE THEM TO YOUR DOCTOR. ° °1. A prescription for pain medication may be given to you upon discharge.  Take your pain medication as prescribed, if needed.  If narcotic pain medicine is not needed, then you may take acetaminophen (Tylenol) or ibuprofen (Advil) as needed. °2. Take your usually prescribed medications unless otherwise directed. °3. If you need a refill on your pain medication, please contact your pharmacy.  They will contact our office to request authorization. Prescriptions will not be filled after 5pm or on week-ends. °4. You should follow a light diet the first few days after arrival home, such as soup and crackers, etc.  Be sure to include lots of fluids daily. °5. Most patients will experience some swelling and bruising in the area of the incisions.  Ice packs will help.  Swelling and bruising can take several days to resolve.  °6. It is common to experience some constipation if taking pain medication after surgery.  Increasing fluid intake and taking a stool softener (such as Colace) will usually help or prevent this problem from occurring.  A mild laxative (Milk of Magnesia or Miralax) should be taken according to package instructions if there are no bowel movements after 48 hours. °7. Unless discharge instructions indicate otherwise, you may remove your bandages 24-48 hours after surgery, and you may shower at that time.  You may have steri-strips (small skin tapes) in place directly over the incision.  These strips should be left on the skin for 7-10 days.  If your surgeon used skin glue on the incision, you may shower in 24 hours.  The glue will flake off over the next 2-3 weeks.  Any sutures or  staples will be removed at the office during your follow-up visit. °8. ACTIVITIES:  You may resume regular (light) daily activities beginning the next day--such as daily self-care, walking, climbing stairs--gradually increasing activities as tolerated.  You may have sexual intercourse when it is comfortable.  Refrain from any heavy lifting or straining until approved by your doctor. °a. You may drive when you are no longer taking prescription pain medication, you can comfortably wear a seatbelt, and you can safely maneuver your car and apply brakes. °b. RETURN TO WORK:  __________________________________________________________ °9. You should see your doctor in the office for a follow-up appointment approximately 2-3 weeks after your surgery.  Make sure that you call for this appointment within a day or two after you arrive home to insure a convenient appointment time. °10. OTHER INSTRUCTIONS: __________________________________________________________________________________________________________________________ __________________________________________________________________________________________________________________________ °WHEN TO CALL YOUR DOCTOR: °1. Fever over 101.0 °2. Inability to urinate °3. Continued bleeding from incision. °4. Increased pain, redness, or drainage from the incision. °5. Increasing abdominal pain ° °The clinic staff is available to answer your questions during regular business hours.  Please don’t hesitate to call and ask to speak to one of the nurses for clinical concerns.  If you have a medical emergency, go to the nearest emergency room or call 911.  A surgeon from [facility] is always on call at the hospital. °[address], [address], [place], [place]  [address] ? [address] [address], [place], [place]   [address] °[phone] ? [phone] ? FAX [phone] °Web site:   [email or web address] °

## 2016-08-30 NOTE — Op Note (Signed)
Patient Name:           [name]   Date of Surgery:        08/30/2016  Pre op Diagnosis:      Gallstones and biliary pancreatitis  Post op Diagnosis:    Same  Procedure:                 Laparoscopic cholecystectomy with cholangiogram  Surgeon:                     Edsel Petrin. Dalbert Batman, M.D., FACS  Assistant:                      Jens Som, M.D.   Indication for Assistant: Complex exposure, prior bariatric surgery, intense chronic inflammation  Operative Indications:    This patient is brought to the operating room electively for gallbladder surgery.  Her PCP is Dr. Woody Seller. Her cardiologist is Dr. Harl Bowie. Her electrophysiologist is Dr. Caryl Comes.      I initially evaluated her own July 03, 2016. She has chronic regurgitation and some heartburn and belching. She was admitted to [facility] on June 12 with epigastric pain and nausea and vomiting after a meal. Lipase was in the 300's. TSH and LFTs were normal. WBC was 13,000. Ultrasound showed some gallstones. CBD 7 mm. No inflammation. C. difficile was negative. She was discharged on June 15.      Comorbidities reveal lap band placed in [place] 10 years ago. Some adjustments. Has lost 40 pounds. She was questioning the lap band so we did an upper GI that showed the lap band to be in good position. She is not having any vomiting. She has not lost any weight lately.. She has nonischemic cardiomyopathy with EF of 15% followed by cardiology. She has an AICD and pacemaker in the left infraclavicular area. Cardiac catheterization 2015 without coronary artery disease. NICM. Depression. Fibromyalgia. CKD2. Obstructive sleep apnea on CPAP. She was told she had a norovirus infection.      She has been cleared for surgery from a cardiac standpoint .  Operative Findings:       The band tubing was in a normal position and was not injured during the case.  It was not adherent to any structure.  The gallbladder was  chronically inflamed, somewhat thin-walled but with very poor tissue integrity.  The bed of the gallbladder was very raw and bled easily requiring hemostatic application of Arista.  The cholangiogram was normal showing normal intrahepatic and extrahepatic biliary anatomy, no filling defect, and no obstruction with good flow of contrast into the duodenum.  The liver, stomach, duodenum, small intestine, and large intestine were otherwise grossly normal to inspection.  Procedure in Detail:          Following the induction of general endotracheal anesthesia the patient's abdomen was prepped and draped in a sterile fashion.  Her AICD had been de-programmed preop and will be reprogrammed postop.  Surgical timeout was performed.  Intravenous antibiotics were given.  0.5% Marcaine with epinephrine was used as local infiltration anesthetic.     A vertical incision was made at the lower rim of the umbilicus.  The fascia was incised in the midline and the abdominal cavity entered under direct vision.  An 11 mm Hassan trocar was inserted with an open technique and secured with the Purstring suture of 0 Vicryl.  Pneumoperitoneum was created and video camera inserted.  Four-quadrant inspection revealed findings as  described above and no evidence of bleeding or intestinal injury.  11 mm trochar was  placed in subxiphoid region and two 5 mm trochars placed in the right upper quadrant.  Gallbladder fundus was identified and elevated.  The infundibulum was retracted.  Slowly took down adhesions in the neck of the gallbladder into isolated the cystic duct and the cystic artery.  Cholangiogram catheter was inserted into the cystic duct and a cholangiogram was obtained using the C-arm.  The cholangiogram was normal as described above.  The cholangiogram catheter was removed, the cystic duct secured with multiple metal clips and divided.  I found an anterior branch and posterior branch of the cystic artery.  These were isolated  separately and secured separately with multiple metal  clips and divided.  The gallbladder was dissected from its bed with electrocautery.  I spilled a little bit of bile from the gallbladder but no stones.  The gallbladder was placed in a specimen bag and removed.      I irrigated the abdomen subhepatic and subphrenic spaces extensively with a 10 mm suction device.  I applied Arista hemostatic powder to the gallbladder bed and we had excellent hemostasis.  Irrigation fluid was completely clear.  There was no bile leak.  The trochars were removed under direct vision and there was no bleeding.  The pneumoperitoneum was released.  The fascia at the umbilicus was closed with 0 Vicryl sutures.  The skin incisions were closed with subcuticular sutures of 4-0 Monocryl and Steri-Strips.  The patient tolerated the procedure well was taken to PACU in stable condition.  EBL 30-40 mL.  Counts correct.  Complications none.     Edsel Petrin. Dalbert Batman, M.D., FACS General and Minimally Invasive Surgery Breast and Colorectal Surgery   Addendum: I logged on to the [facility] website and reviewed her prescription medication history.  08/30/2016 11:48 AM

## 2016-08-31 ENCOUNTER — Encounter (HOSPITAL_COMMUNITY): Payer: Self-pay | Admitting: General Surgery

## 2016-08-31 DIAGNOSIS — K8010 Calculus of gallbladder with chronic cholecystitis without obstruction: Secondary | ICD-10-CM | POA: Diagnosis not present

## 2016-08-31 LAB — CBC
HEMATOCRIT: 36.5 % (ref 36.0–46.0)
HEMOGLOBIN: 11.3 g/dL — AB (ref 12.0–15.0)
MCH: 28.4 pg (ref 26.0–34.0)
MCHC: 31 g/dL (ref 30.0–36.0)
MCV: 91.7 fL (ref 78.0–100.0)
Platelets: 168 10*3/uL (ref 150–400)
RBC: 3.98 MIL/uL (ref 3.87–5.11)
RDW: 13 % (ref 11.5–15.5)
WBC: 9.4 10*3/uL (ref 4.0–10.5)

## 2016-08-31 LAB — BASIC METABOLIC PANEL
Anion gap: 3 — ABNORMAL LOW (ref 5–15)
BUN: 15 mg/dL (ref 6–20)
CHLORIDE: 108 mmol/L (ref 101–111)
CO2: 26 mmol/L (ref 22–32)
CREATININE: 0.86 mg/dL (ref 0.44–1.00)
Calcium: 9.5 mg/dL (ref 8.9–10.3)
GFR calc non Af Amer: >60 mL/min (ref >60)
Glucose, Bld: 103 mg/dL — ABNORMAL HIGH (ref 65–99)
POTASSIUM: 4.7 mmol/L (ref 3.5–5.1)
Sodium: 137 mmol/L (ref 135–145)

## 2016-08-31 NOTE — Progress Notes (Signed)
Patient discharged to home with instructions and prescription. 

## 2016-08-31 NOTE — Discharge Summary (Signed)
Patient ID: [name] [id number] 61 y.o. August 27, 1955  Admit date: 08/30/2016  Discharge date and time: 08/31/2016  Admitting Physician: Adin Hector  Discharge Physician: Adin Hector  Admission Diagnoses: gallstone pancreatitis  Discharge Diagnoses: gallstone pancreatitis                                         History lap band surgery                                         Fibromyalgia                                         Nonischemic cardiomyopathy                                         AICD present (Z95.84)                                         Depression, controlled                                         CKD, stage II                                         Obstructive sleep apnea                                         BMI 32  Operations: Procedure(s): LAPAROSCOPIC CHOLECYSTECTOMY WITH INTRAOPERATIVE CHOLANGIOGRAM  Admission Condition: good  Discharged Condition: good  Indication for Admission:   This patient is brought to the operating room electively for gallbladder surgery.  Her PCP is Dr. Woody Seller. Her cardiologist is Dr. Harl Bowie. Her electrophysiologist is Dr. Caryl Comes.      I initially evaluated her own July 03, 2016. She has chronic regurgitation and some heartburn and belching. She was admitted to [facility] on June 12 with epigastric pain and nausea and vomiting after a meal. Lipase was in the 300's. TSH and LFTs were normal. WBC was 13,000. Ultrasound showed some gallstones. CBD 7 mm. No inflammation. C. difficile was negative. She was discharged on June 15.      Comorbidities reveal lap band placed in [place] 10 years ago. Some adjustments. Has lost 40 pounds. She was questioning the lap band so we did an upper GI that showed the lap band to be in good position. She is not having any vomiting. She has not lost any weight lately.. She has nonischemic cardiomyopathy with EF of 15% followed by cardiology. She has an AICD and  pacemaker in the left infraclavicular area. Cardiac catheterization 2015 without coronary artery disease. NICM. Depression. Fibromyalgia. CKD2. Obstructive sleep apnea on CPAP. She was  told she had a norovirus infection.      She has been cleared for surgery from a cardiac standpoint  Hospital Course: on the day of admission the patient was taken to the operating room and underwent laparoscopic cholecystectomy with cholangiogram.     Operative findings were The band tubing was in a normal position and was not injured during the case.  It was not adherent to any structure.  The gallbladder was chronically inflamed, somewhat thin-walled but with very poor tissue integrity.  The bed of the gallbladder was very raw and bled easily requiring hemostatic application of [place].  The cholangiogram was normal showing normal intrahepatic and extrahepatic biliary anatomy, no filling defect, and no obstruction with good flow of contrast into the duodenum.  The liver, stomach, duodenum, small intestine, and large intestine were otherwise grossly normal to inspection.     Postoperatively the patient did well.  She had no problems with her AICD programming.  She was having too much pain to go home so we kept her overnight.  She did well and on postop day 1 the patient and her family was asking for her to be discharged home.  She was able to tolerate bariatric diet somewhat.  She was ambulating in the hall.  She was voiding uneventfully.  Physical exam the morning of discharge reveals she was alert and in no distress.  Abdomen was soft.  Appropriately tender.  Nondistended.  Wounds look good.  Lab work was drawn and her be met and CBC were unremarkable for postop state.  No signs of inflammation or bleeding.     The patient and I both believe that her incisional pain was simply amplified by her fibromyalgia.  She is followed at a pain clinic in [facility]     She was given instructions in diet, activities,  restrictions, follow-up with me.  She has a prescription for Norco for pain.       Consults: None  Significant Diagnostic Studies: lab work and surgical pathology.  Treatments: surgery: laparoscopic cholecystectomy with cholangiogram  Disposition: Home  Patient Instructions:  Allergies as of 08/31/2016      Reactions   Nexium [esomeprazole Magnesium] Swelling, Rash   Nucynta [tapentadol] Itching   Oxycodone Itching      Medication List    TAKE these medications   acetaminophen 500 MG tablet Commonly known as:  TYLENOL Take 1,000 mg by mouth 2 (two) times daily as needed for moderate pain or headache.   aspirin 81 MG EC tablet Take 1 tablet (81 mg total) by mouth daily.   atorvastatin 20 MG tablet Commonly known as:  LIPITOR Take 20 mg by mouth at bedtime.   cetirizine 10 MG tablet Commonly known as:  ZYRTEC Take 10 mg by mouth at bedtime.   diazepam 5 MG tablet Commonly known as:  VALIUM Take 2.5-5 mg by mouth 2 (two) times daily as needed for anxiety.   diclofenac sodium 1 % Gel Commonly known as:  VOLTAREN Apply 1 application topically 3 (three) times daily as needed (fibromyalgia).   docusate sodium 100 MG capsule Commonly known as:  COLACE Take 200 mg by mouth at bedtime as needed for mild constipation.   EPINEPHrine 0.3 mg/0.3 mL Soaj injection Commonly known as:  EPIPEN 2-PAK Inject 0.3 mLs (0.3 mg total) into the muscle once as needed (for severe allergic reaction). CAll 911 immediately if you have to use this medicine   gabapentin 300 MG capsule Commonly known as:  NEURONTIN  Take 600 mg by mouth 3 (three) times daily.   HYDROcodone-acetaminophen 7.5-325 MG tablet Commonly known as:  NORCO Take 1 tablet by mouth every 6 (six) hours. What changed:  Another medication with the same name was added. Make sure you understand how and when to take each.   HYDROcodone-acetaminophen 5-325 MG tablet Commonly known as:  NORCO Take 1-2 tablets by mouth every  6 (six) hours as needed for moderate pain or severe pain. What changed:  You were already taking a medication with the same name, and this prescription was added. Make sure you understand how and when to take each.   hydrocortisone cream 1 % Apply 1 application topically 2 (two) times daily as needed for itching.   hydrOXYzine 10 MG tablet Commonly known as:  ATARAX/VISTARIL Take 1 tablet (10 mg total) by mouth every 6 (six) hours as needed for itching.   levothyroxine 112 MCG tablet Commonly known as:  SYNTHROID, LEVOTHROID Take 112 mcg by mouth daily before breakfast.   loperamide 2 MG capsule Commonly known as:  IMODIUM Take 1 capsule (2 mg total) by mouth 4 (four) times daily as needed for diarrhea or loose stools.   losartan 25 MG tablet Commonly known as:  COZAAR Take 0.5 tablets (12.5 mg total) by mouth daily.   metoprolol succinate 25 MG 24 hr tablet Commonly known as:  TOPROL-XL Take 0.5 tablets (12.5 mg total) by mouth daily.   multivitamin with minerals Tabs tablet Take 1 tablet by mouth daily.   polyethylene glycol packet Commonly known as:  MIRALAX / GLYCOLAX Take 17 g by mouth daily as needed for mild constipation.   Probiotic Caps Take 1 capsule by mouth daily.   promethazine 25 MG tablet Commonly known as:  PHENERGAN Take 25 mg by mouth daily as needed for nausea or vomiting.   ranitidine 150 MG tablet Commonly known as:  ZANTAC Take 150 mg by mouth 2 (two) times daily.   sertraline 100 MG tablet Commonly known as:  ZOLOFT Take 100 mg by mouth daily.   spironolactone 25 MG tablet Commonly known as:  ALDACTONE Take 0.5 tablets (12.5 mg total) by mouth daily. At bedtime   tiZANidine 4 MG tablet Commonly known as:  ZANAFLEX Take 2-4 mg by mouth every 8 (eight) hours as needed for muscle spasms (for fibromyalgia).   torsemide 20 MG tablet Commonly known as:  DEMADEX Take 1 tablet (20 mg total) by mouth daily as needed (FOR SWELLING).   TURMERIC  PO Take 100 mg by mouth daily.   WOMENS MULTIVITAMIN Tabs Take 1 tablet by mouth daily.            Discharge Care Instructions        Start     Ordered   08/31/16 0000  Increase activity slowly     08/31/16 0700   08/31/16 0000  Diet - low sodium heart healthy     08/31/16 0700   08/31/16 0000  Discharge instructions    Comments:  Continue all of your usual medications Stay well hydrated Follow a bariatric diet Take a walk around the block every day No lifting more than 20 pounds You may shower  Continue routine pain management at your pain clinic        [facility] ______[facility] LAPAROSCOPIC SURGERY: POST OP INSTRUCTIONS Always review your discharge instruction sheet given to you by the facility where your surgery was performed. IF YOU HAVE DISABILITY OR FAMILY LEAVE FORMS, YOU MUST BRING  THEM TO THE OFFICE FOR PROCESSING.   DO NOT GIVE THEM TO YOUR DOCTOR.  A prescription for pain medication may be given to you upon discharge.  Take your pain medication as prescribed, if needed.  If narcotic pain medicine is not needed, then you may take acetaminophen (Tylenol) or ibuprofen (Advil) as needed. Take your usually prescribed medications unless otherwise directed. If you need a refill on your pain medication, please contact your pharmacy.  They will contact our office to request authorization. Prescriptions will not be filled after 5pm or on week-ends. You should follow a light diet the first few days after arrival home, such as soup and crackers, etc.  Be sure to include lots of fluids daily. Most patients will experience some swelling and bruising in the area of the incisions.  Ice packs will help.  Swelling and bruising can take several days to resolve.  It is common to experience some constipation if taking pain medication after surgery.  Increasing fluid intake and taking a stool softener (such as Colace) will usually help or prevent this problem from  occurring.  A mild laxative (Milk of Magnesia or Miralax) should be taken according to package instructions if there are no bowel movements after 48 hours. Unless discharge instructions indicate otherwise, you may remove your bandages 24-48 hours after surgery, and you may shower at that time.  You may have steri-strips (small skin tapes) in place directly over the incision.  These strips should be left on the skin for 7-10 days.  If your surgeon used skin glue on the incision, you may shower in 24 hours.  The glue will flake off over the next 2-3 weeks.  Any sutures or staples will be removed at the office during your follow-up visit. ACTIVITIES:  You may resume regular (light) daily activities beginning the next day-such as daily self-care, walking, climbing stairs-gradually increasing activities as tolerated.  You may have sexual intercourse when it is comfortable.  Refrain from any heavy lifting or straining until approved by your doctor. You may drive when you are no longer taking prescription pain medication, you can comfortably wear a seatbelt, and you can safely maneuver your car and apply brakes. RETURN TO WORK:  __________________________________________________________ Dennis Bast should see your doctor in the office for a follow-up appointment approximately 2-3 weeks after your surgery.  Make sure that you call for this appointment within a day or two after you arrive home to insure a convenient appointment time. OTHER INSTRUCTIONS: __________________________________________________________________________________________________________________________ __________________________________________________________________________________________________________________________ WHEN TO CALL YOUR DOCTOR: Fever over 101.0 Inability to urinate Continued bleeding from incision. Increased pain, redness, or drainage from the incision. Increasing abdominal pain  The clinic staff is available to answer your  questions during regular business hours.  Please don't hesitate to call and ask to speak to one of the nurses for clinical concerns.  If you have a medical emergency, go to the nearest emergency room or call 911.  A surgeon from [facility] is always on call at the hospital. [address], [place], [place], [place]  [address] ? [address], [place], [place]   [address] [phone] ? [phone] ? FAX [phone] Web site: [email or web address]   08/31/16 0700   08/31/16 0000  May walk up steps     08/31/16 0700   08/31/16 0000  May shower / [place]     08/31/16 0700   08/31/16 0000  Driving Restrictions    Comments:  2-4 days   08/31/16 0700  08/31/16 0000  Lifting restrictions    Comments:  20 lbs   08/31/16 0700   08/31/16 0000  Discharge wound care:    Comments:  The small bandages can stay on for 2-3 days When they are removed, simply replace with a Band-Aid for about one week After that, the wounds can be left open to air The Steri-Strips on the skin can be removed in 10 days if they are still present Okay to shower but no tub baths   08/31/16 0700   08/31/16 0000  Call MD for:  persistant dizziness or light-headedness     08/31/16 0700   08/31/16 0000  Call MD for:  hives     08/31/16 0700   08/31/16 0000  Call MD for:  difficulty breathing, headache or visual disturbances     08/31/16 0700   08/31/16 0000  Call MD for:  redness, tenderness, or signs of infection (pain, swelling, redness, odor or green/yellow discharge around incision site)     08/31/16 0700   08/31/16 0000  Call MD for:  severe uncontrolled pain     08/31/16 0700   08/31/16 0000  Call MD for:  persistant nausea and vomiting     08/31/16 0700   08/31/16 0000  Call MD for:  temperature >100.4     08/31/16 0700   08/30/16 0000  HYDROcodone-acetaminophen (NORCO) 5-325 MG tablet  Every 6 hours PRN     08/30/16 1201      Activity: no heavy lifting for 3 weeks Diet:  bariatric diet Wound Care: as directed  Follow-up:  With Dr. Dalbert Batman in 3 weeks.  Signed: Edsel Petrin. Dalbert Batman, M.D., FACS General and minimally invasive surgery Breast and Colorectal Surgery  08/31/2016, 7:01 AM

## 2016-08-31 NOTE — Care Management Note (Signed)
Case Management Note  Patient Details  Name: [name] MRN: [id number] Date of Birth: 15-Mar-1955  Subjective/Objective:    POD 1 lap chole, dc home, no NCM consults, no needs.                Action/Plan:   Expected Discharge Date:  08/31/16               Expected Discharge Plan:  Home/Self Care  In-House Referral:     Discharge planning Services  CM Consult  Post Acute Care Choice:    Choice offered to:     DME Arranged:    DME Agency:     HH Arranged:    HH Agency:     Status of Service:  Completed, signed off  If discussed at [employer] of Stay Meetings, dates discussed:    Additional Comments:  Zenon Mayo, RN 08/31/2016, 11:02 AM

## 2016-09-13 ENCOUNTER — Encounter: Payer: Medicare HMO | Admitting: *Deleted

## 2016-09-13 ENCOUNTER — Telehealth: Payer: Self-pay | Admitting: Cardiology

## 2016-09-13 NOTE — Telephone Encounter (Signed)
Spoke with pt and reminded pt of remote transmission that is due today. Pt verbalized understanding.   

## 2016-09-15 ENCOUNTER — Encounter: Payer: Self-pay | Admitting: Internal Medicine

## 2016-09-15 ENCOUNTER — Ambulatory Visit (INDEPENDENT_AMBULATORY_CARE_PROVIDER_SITE_OTHER): Payer: Medicare HMO | Admitting: Internal Medicine

## 2016-09-15 VITALS — BP 92/60 | HR 62 | Ht 63.0 in | Wt 184.0 lb

## 2016-09-15 DIAGNOSIS — I5022 Chronic systolic (congestive) heart failure: Secondary | ICD-10-CM | POA: Diagnosis not present

## 2016-09-15 DIAGNOSIS — I447 Left bundle-branch block, unspecified: Secondary | ICD-10-CM | POA: Diagnosis not present

## 2016-09-15 DIAGNOSIS — I428 Other cardiomyopathies: Secondary | ICD-10-CM

## 2016-09-15 LAB — CUP PACEART INCLINIC DEVICE CHECK
Brady Statistic RA Percent Paced: 34 %
Brady Statistic RV Percent Paced: 92 %
Date Time Interrogation Session: 20180907123729
HIGH POWER IMPEDANCE MEASURED VALUE: 73.125
Implantable Lead Implant Date: 20160104
Implantable Lead Implant Date: 20160104
Implantable Lead Location: 753858
Implantable Lead Location: 753860
Implantable Pulse Generator Implant Date: 20160104
Lead Channel Impedance Value: 462.5 Ohm
Lead Channel Impedance Value: 912.5 Ohm
Lead Channel Pacing Threshold Amplitude: 0.75 V
Lead Channel Pacing Threshold Amplitude: 0.75 V
Lead Channel Pacing Threshold Amplitude: 0.75 V
Lead Channel Pacing Threshold Amplitude: 0.75 V
Lead Channel Pacing Threshold Pulse Width: 0.5 ms
Lead Channel Pacing Threshold Pulse Width: 0.5 ms
Lead Channel Pacing Threshold Pulse Width: 0.5 ms
Lead Channel Sensing Intrinsic Amplitude: 2.1 mV
Lead Channel Setting Pacing Amplitude: 1.5 V
Lead Channel Setting Sensing Sensitivity: 0.5 mV
MDC IDC LEAD IMPLANT DT: 20160104
MDC IDC LEAD LOCATION: 753859
MDC IDC MSMT BATTERY REMAINING LONGEVITY: 56 mo
MDC IDC MSMT LEADCHNL LV PACING THRESHOLD AMPLITUDE: 0.75 V
MDC IDC MSMT LEADCHNL LV PACING THRESHOLD PULSEWIDTH: 0.5 ms
MDC IDC MSMT LEADCHNL LV PACING THRESHOLD PULSEWIDTH: 0.5 ms
MDC IDC MSMT LEADCHNL RA PACING THRESHOLD PULSEWIDTH: 0.5 ms
MDC IDC MSMT LEADCHNL RV IMPEDANCE VALUE: 437.5 Ohm
MDC IDC MSMT LEADCHNL RV PACING THRESHOLD AMPLITUDE: 0.75 V
MDC IDC MSMT LEADCHNL RV SENSING INTR AMPL: 11.7 mV
MDC IDC SET LEADCHNL LV PACING AMPLITUDE: 2 V
MDC IDC SET LEADCHNL LV PACING PULSEWIDTH: 0.5 ms
MDC IDC SET LEADCHNL RV PACING AMPLITUDE: 2.5 V
MDC IDC SET LEADCHNL RV PACING PULSEWIDTH: 0.5 ms
Pulse Gen Serial Number: 7222096

## 2016-09-15 NOTE — Patient Instructions (Addendum)
Medication Instructions:   Your physician recommends that you continue on your current medications as directed. Please refer to the Current Medication list given to you today.  Labwork:  NONE  Testing/Procedures:  NONE  Follow-Up: Your physician recommends that you schedule a follow-up appointment in: 1 year. Please schedule this appointment today before leaving the office.  Any Other Special Instructions Will Be Listed Below (If Applicable).  Your next device check is on 12/18/16.  You are being enrolled in the [facility] clinic. Please refer to the letter given to you today for details about this clinic.   If you need a refill on your cardiac medications before your next appointment, please call your pharmacy.

## 2016-09-15 NOTE — Progress Notes (Signed)
PCP: Glenda Chroman, MD Primary Cardiologist:  Dr Harl Bowie Primary EP: previously Dr Dyke Brackett is a 61 y.o. female who presents today for routine electrophysiology followup.  Since last being seen in our clinic, the patient reports doing very well. She has a SJM BIV ICD implanted by Dr Caryl Comes. Follow-up echo 2016 revealed good response to CRT.  She does have SOB with moderate activity.  Today, she denies symptoms of palpitations, chest pain, lower extremity edema, dizziness, presyncope, syncope, or ICD shocks.  The patient is otherwise without complaint today.   Past Medical History:  Diagnosis Date  . Anxiety   . Arthritis    "joints; elbows; knees; back; neck:" (08/30/2016)  . Asthma   . Cancer of skin, face   . Chronic back pain    "mid to upper" (08/30/2016)  . Chronic bronchitis ([place])   . Chronic kidney failure, stage 2 (mild)   . Chronic systolic HF (heart failure) ([place])    a. NICM b. RHC (09/08/13): RA: 2, RV 26/0/2, PA: 25/9 (15), PCWP: 9, Fick CO/CI: 4.6 / 2.5, PA 68% c. ECHO (08/2013) EF 20-25%, grade II DD, RV sys fx mildly reduced, mod TR  . COPD (chronic obstructive pulmonary disease) ([place])   . Depression   . Fibromyalgia   . GERD (gastroesophageal reflux disease)   . Hypothyroidism   . ICD (implantable cardioverter-defibrillator), dual, in situ 01/12/2014   [employer] Jude ; for primary prevention for EF<35% by Dr Caryl Comes  . Left bundle branch block   . NICM (nonischemic cardiomyopathy) ([place])    a. LHC (08/2013): no angiographic evidence of CAD  . OSA on CPAP   . Pneumonia    "several times" (08/30/2016)   Past Surgical History:  Procedure Laterality Date  . BI-VENTRICULAR IMPLANTABLE CARDIOVERTER DEFIBRILLATOR N/A 01/12/2014   SJM Quadra Assura CRT-D biv ICD implanted by Dr Caryl Comes for primary prevention of sudden death  . CARDIAC CATHETERIZATION    . CHOLECYSTECTOMY N/A 08/30/2016   Procedure: LAPAROSCOPIC CHOLECYSTECTOMY WITH INTRAOPERATIVE CHOLANGIOGRAM;  Surgeon:  Fanny Skates, MD;  Location: [facility];  Service: General;  Laterality: N/A;  . FRACTURE SURGERY    . HUMERUS FRACTURE SURGERY Left    "fell after I had my RCR"  . LAPAROSCOPIC CHOLECYSTECTOMY  08/30/2016   w/IOC  . LAPAROSCOPIC GASTRIC BANDING    . LEFT AND RIGHT HEART CATHETERIZATION WITH CORONARY ANGIOGRAM N/A 09/08/2013   Procedure: LEFT AND RIGHT HEART CATHETERIZATION WITH CORONARY ANGIOGRAM;  Surgeon: Burnell Blanks, MD;  Location: [facility] CATH LAB;  Service: Cardiovascular;  Laterality: N/A;  . SHOULDER OPEN ROTATOR CUFF REPAIR Left   . TONSILLECTOMY    . TUBAL LIGATION    . VAGINAL HYSTERECTOMY      ROS- all systems are reviewed and negative except as per HPI above  Current Outpatient Prescriptions  Medication Sig Dispense Refill  . acetaminophen (TYLENOL) 500 MG tablet Take 1,000 mg by mouth 2 (two) times daily as needed for moderate pain or headache.    Marland Kitchen aspirin EC 81 MG EC tablet Take 1 tablet (81 mg total) by mouth daily.    Marland Kitchen atorvastatin (LIPITOR) 20 MG tablet Take 20 mg by mouth at bedtime.    . cetirizine (ZYRTEC) 10 MG tablet Take 10 mg by mouth at bedtime.     . diazepam (VALIUM) 5 MG tablet Take 2.5-5 mg by mouth 2 (two) times daily as needed for anxiety.     . diclofenac sodium (VOLTAREN) 1 %  GEL Apply 1 application topically 3 (three) times daily as needed (fibromyalgia).     Marland Kitchen docusate sodium (COLACE) 100 MG capsule Take 200 mg by mouth at bedtime as needed for mild constipation.    Marland Kitchen EPINEPHrine (EPIPEN 2-PAK) 0.3 mg/0.3 mL IJ SOAJ injection Inject 0.3 mLs (0.3 mg total) into the muscle once as needed (for severe allergic reaction). CAll 911 immediately if you have to use this medicine 1 Device 1  . gabapentin (NEURONTIN) 300 MG capsule Take 600 mg by mouth 3 (three) times daily.     Marland Kitchen HYDROcodone-acetaminophen (NORCO) 5-325 MG tablet Take 1-2 tablets by mouth every 6 (six) hours as needed for moderate pain or severe pain. 20 tablet 0  . HYDROcodone-acetaminophen  (NORCO) 7.5-325 MG per tablet Take 1 tablet by mouth every 6 (six) hours.     . hydrocortisone cream 1 % Apply 1 application topically 2 (two) times daily as needed for itching.    . hydrOXYzine (ATARAX/VISTARIL) 10 MG tablet Take 1 tablet (10 mg total) by mouth every 6 (six) hours as needed for itching. 20 tablet 0  . levothyroxine (SYNTHROID, LEVOTHROID) 112 MCG tablet Take 112 mcg by mouth daily before breakfast.    . loperamide (IMODIUM) 2 MG capsule Take 1 capsule (2 mg total) by mouth 4 (four) times daily as needed for diarrhea or loose stools. 30 capsule 0  . losartan (COZAAR) 25 MG tablet Take 0.5 tablets (12.5 mg total) by mouth daily. 45 tablet 3  . metoprolol succinate (TOPROL-XL) 25 MG 24 hr tablet Take 0.5 tablets (12.5 mg total) by mouth daily. 45 tablet 3  . Multiple Vitamin (MULTIVITAMIN WITH MINERALS) TABS tablet Take 1 tablet by mouth daily.    . Multiple Vitamins-Minerals (WOMENS MULTIVITAMIN) TABS Take 1 tablet by mouth daily.    . polyethylene glycol (MIRALAX / GLYCOLAX) packet Take 17 g by mouth daily as needed for mild constipation.     . Probiotic CAPS Take 1 capsule by mouth daily.    . promethazine (PHENERGAN) 25 MG tablet Take 25 mg by mouth daily as needed for nausea or vomiting.     . ranitidine (ZANTAC) 150 MG tablet Take 150 mg by mouth 2 (two) times daily.     . sertraline (ZOLOFT) 100 MG tablet Take 100 mg by mouth daily.    Marland Kitchen spironolactone (ALDACTONE) 25 MG tablet Take 0.5 tablets (12.5 mg total) by mouth daily. At bedtime 45 tablet 3  . tiZANidine (ZANAFLEX) 4 MG tablet Take 2-4 mg by mouth every 8 (eight) hours as needed for muscle spasms (for fibromyalgia).     . torsemide (DEMADEX) 20 MG tablet Take 1 tablet (20 mg total) by mouth daily as needed (FOR SWELLING). 30 tablet 6  . TURMERIC PO Take 100 mg by mouth daily.     No current facility-administered medications for this visit.     Physical Exam: Vitals:   09/15/16 1126  BP: 92/60  Pulse: 62  SpO2:  98%  Weight: 184 lb (83.5 kg)  Height: 5\' 3"  (1.6 m)    GEN- The patient is well appearing, alert and oriented x 3 today.   Head- normocephalic, atraumatic Eyes-  Sclera clear, conjunctiva pink Ears- hearing intact Oropharynx- clear Lungs- Clear to ausculation bilaterally, normal work of breathing Chest- ICD pocket is well healed Heart- Regular rate and rhythm, no murmurs, rubs or gallops, PMI not laterally displaced GI- soft, NT, ND, + BS Extremities- no clubbing, cyanosis, or edema  ICD interrogation- reviewed  in detail today,  See PACEART report  ekg tracing ordered today is personally reviewed and shows sinus rhythm with Biv Pacing  Assessment and Plan:  1.  Chronic systolic dysfunction/ LBBB/ nonischemic CM euvolemic today Stable on an appropriate medical regimen Normal ICD function See Pace Art report No changes today Will enroll in ICM device clinic  Merlin Return to see me in a year Follow-up with Dr Harl Bowie as scheduled  Thompson Grayer MD, [facility] 09/15/2016 12:00 PM

## 2016-09-19 ENCOUNTER — Telehealth: Payer: Self-pay

## 2016-09-19 NOTE — Telephone Encounter (Signed)
Referred to ICM clinic by Dr. Rayann Heman.   Call to patient and provided ICM intro.  She agreed to monthly ICM follow up.  She takes PRN Torsemide when she experiences fluid symptoms which are stomach bloating and swelling in feet and hands.  1st ICM remote transmission scheduled for 10/26/2016.  Provided ICM direct number.

## 2016-10-26 ENCOUNTER — Ambulatory Visit (INDEPENDENT_AMBULATORY_CARE_PROVIDER_SITE_OTHER): Payer: Medicare HMO

## 2016-10-26 DIAGNOSIS — I5022 Chronic systolic (congestive) heart failure: Secondary | ICD-10-CM | POA: Diagnosis not present

## 2016-10-26 DIAGNOSIS — Z95810 Presence of automatic (implantable) cardiac defibrillator: Secondary | ICD-10-CM

## 2016-10-26 NOTE — Progress Notes (Signed)
EPIC Encounter for ICM Monitoring  Patient Name: [name] is a 61 y.o. female Date: 10/26/2016 Primary Care Physican: Glenda Chroman, MD Primary Cardiologist: Branch Electrophysiologist: Allred Dry Weight: 181 lbs Bi-V Pacing:   97%       Heart Failure questions reviewed, pt asymptomatic.  She takes PRN Torsemide when she experiences fluid symptoms which are stomach bloating and swelling in feet and hands.   She took Torsemide during decreased impedance.    Thoracic impedance normal.  Prescribed dosage: Torsemide 20 mg 1 tablet as needed  Recommendations:  No changes.   Encouraged to call for fluid symptoms.  Follow-up plan: ICM clinic phone appointment on 11/27/2016.    Copy of ICM check sent to Dr. Rayann Heman.   3 month ICM trend: 10/26/2016   1 Year ICM trend:      [name], RN 10/26/2016 3:10 PM

## 2016-11-27 ENCOUNTER — Ambulatory Visit (INDEPENDENT_AMBULATORY_CARE_PROVIDER_SITE_OTHER): Payer: Medicare HMO

## 2016-11-27 DIAGNOSIS — I5022 Chronic systolic (congestive) heart failure: Secondary | ICD-10-CM

## 2016-11-27 DIAGNOSIS — Z95810 Presence of automatic (implantable) cardiac defibrillator: Secondary | ICD-10-CM

## 2016-11-27 NOTE — Progress Notes (Signed)
EPIC Encounter for ICM Monitoring  Patient Name: [name] is a 61 y.o. female Date: 11/27/2016 Primary Care Physican: Glenda Chroman, MD Primary Cardiologist: Branch Electrophysiologist: Allred Dry Weight:  182.1 lbs Bi-V Pacing:   97%       Heart Failure questions reviewed, pt symptomatic with swelling in feet and hands and weight gain but does resolve after taking Torsemide most days.  She knows she does not always follow a low salt diet but does try to.     Thoracic impedance normal but was abnormal suggesting fluid accumulation from 11/15/2016 to 11/23/2016 and 10/22/2016 to 11/02/2016.  Prescribed dosage: Torsemide 20 mg 1 tablet as needed  Recommendations:  Patient is taking Torsemide more often to manage fluid symptoms of swelling and weight gain.  She asked if she should start taking a daily dose of Torsemide since she is taking it more often.  Advised to discuss with Dr Harl Bowie at her next visit with she is due in January.    Follow-up plan: ICM clinic phone appointment on 12/28/2016.    Copy of ICM check sent to Dr. Rayann Heman and Dr Harl Bowie   3 month ICM trend: 11/27/2016    1 Year ICM trend:       [name], RN 11/27/2016 12:38 PM

## 2016-11-28 DIAGNOSIS — I5022 Chronic systolic (congestive) heart failure: Secondary | ICD-10-CM

## 2016-11-28 DIAGNOSIS — Z95810 Presence of automatic (implantable) cardiac defibrillator: Secondary | ICD-10-CM | POA: Diagnosis not present

## 2016-12-26 ENCOUNTER — Other Ambulatory Visit: Payer: Self-pay | Admitting: Cardiology

## 2016-12-28 ENCOUNTER — Telehealth: Payer: Self-pay | Admitting: Cardiology

## 2016-12-28 ENCOUNTER — Ambulatory Visit (INDEPENDENT_AMBULATORY_CARE_PROVIDER_SITE_OTHER): Payer: Medicare HMO | Admitting: *Deleted

## 2016-12-28 DIAGNOSIS — Z95810 Presence of automatic (implantable) cardiac defibrillator: Secondary | ICD-10-CM

## 2016-12-28 DIAGNOSIS — I5022 Chronic systolic (congestive) heart failure: Secondary | ICD-10-CM | POA: Diagnosis not present

## 2016-12-28 DIAGNOSIS — I428 Other cardiomyopathies: Secondary | ICD-10-CM

## 2016-12-28 NOTE — Progress Notes (Signed)
Remote ICD transmission.   

## 2016-12-28 NOTE — Progress Notes (Signed)
EPIC Encounter for ICM Monitoring  Patient Name: [name] is a 61 y.o. female Date: 12/28/2016 Primary Care Physican: Glenda Chroman, MD Primary Cardiologist:Branch Electrophysiologist:Allred Dry Weight: 185.2 lbs ( baseline 180 lbs) Bi-V Pacing:98%            Heart Failure questions reviewed, pt symptomatic with swelling in legs, abdomen and for the 1st time some puffy around.  Weight gain of 3-4 pounds.  She is unsure if Torsemide is working and advised to discuss with Dr Harl Bowie at next appointment due in January.  She feels like she is having a hard time getting rid of the fluid   Thoracic impedance abnormal suggesting fluid accumulation since 12/23/2016 but is trending back to baseline.  Prescribed dosage: Torsemide20 mg 1 tablet as needed.     Recommendations:  She has taken 1 tablet of Torsemide for last 2 days.  Advised to take Torsemide 20 mg take 2 tablets for 2 days or if if symptoms resolved before 2nd day to go back to PRN.  Advised to limit salt intake.   Follow-up plan: ICM clinic phone appointment on 01/05/2017. She is due to make an appointment with Dr Harl Bowie for January.   Copy of ICM check sent to Dr. Harl Bowie and Dr. Rayann Heman.   3 month ICM trend: 12/28/2016    1 Year ICM trend:       [name], RN 12/28/2016 2:01 PM

## 2016-12-28 NOTE — Telephone Encounter (Signed)
Spoke with pt and reminded pt of remote transmission that is due today. Pt verbalized understanding.   

## 2016-12-29 ENCOUNTER — Encounter: Payer: Self-pay | Admitting: Cardiology

## 2016-12-29 NOTE — Progress Notes (Signed)
Letter  

## 2017-01-05 ENCOUNTER — Ambulatory Visit (INDEPENDENT_AMBULATORY_CARE_PROVIDER_SITE_OTHER): Payer: Self-pay

## 2017-01-05 DIAGNOSIS — Z95810 Presence of automatic (implantable) cardiac defibrillator: Secondary | ICD-10-CM

## 2017-01-05 DIAGNOSIS — I5022 Chronic systolic (congestive) heart failure: Secondary | ICD-10-CM

## 2017-01-05 NOTE — Progress Notes (Signed)
EPIC Encounter for ICM Monitoring  Patient Name: [name] is a 61 y.o. female Date: 01/05/2017 Primary Care Physican: Glenda Chroman, MD Primary Cardiologist:Branch Electrophysiologist:Allred Dry Weight:178 lbs ( baseline 180 lbs) Bi-V Pacing:98%       Heart Failure questions reviewed, pt lost 7 lbs after taking Torsemide daily until today.  Leg swelling has resolved.    Thoracic impedance returned to normal since taking Torsemide.  Prescribed dosage: Torsemide20 mg 1 tablet as needed.     Recommendations:  Advised to return to PRN because report is suggesting she may be a litte dehydrated.    Follow-up plan: ICM clinic phone appointment on 01/29/2017.  She will be making an appointment with Dr Harl Bowie for January.   Copy of ICM check sent to Dr. Rayann Heman.   3 month ICM trend: 01/05/2017    1 Year ICM trend:       Sandra Billings, RN 01/05/2017 3:36 PM

## 2017-01-10 LAB — CUP PACEART REMOTE DEVICE CHECK
Battery Remaining Longevity: 54 mo
Battery Remaining Percentage: 61 %
Battery Voltage: 2.98 V
Brady Statistic RA Percent Paced: 33 %
Date Time Interrogation Session: 20181228070018
HIGH POWER IMPEDANCE MEASURED VALUE: 80 Ohm
HIGH POWER IMPEDANCE MEASURED VALUE: 80 Ohm
Implantable Lead Implant Date: 20160104
Implantable Lead Implant Date: 20160104
Implantable Lead Location: 753859
Implantable Lead Location: 753860
Implantable Pulse Generator Implant Date: 20160104
Lead Channel Impedance Value: 1100 Ohm
Lead Channel Impedance Value: 460 Ohm
Lead Channel Pacing Threshold Amplitude: 0.5 V
Lead Channel Pacing Threshold Amplitude: 0.75 V
Lead Channel Pacing Threshold Pulse Width: 0.5 ms
Lead Channel Pacing Threshold Pulse Width: 0.5 ms
Lead Channel Sensing Intrinsic Amplitude: 11.7 mV
Lead Channel Setting Pacing Amplitude: 2 V
Lead Channel Setting Sensing Sensitivity: 0.5 mV
MDC IDC LEAD IMPLANT DT: 20160104
MDC IDC LEAD LOCATION: 753858
MDC IDC MSMT LEADCHNL RA IMPEDANCE VALUE: 510 Ohm
MDC IDC MSMT LEADCHNL RA PACING THRESHOLD AMPLITUDE: 0.625 V
MDC IDC MSMT LEADCHNL RA SENSING INTR AMPL: 2.4 mV
MDC IDC MSMT LEADCHNL RV PACING THRESHOLD PULSEWIDTH: 0.5 ms
MDC IDC PG SERIAL: 7222096
MDC IDC SET LEADCHNL LV PACING PULSEWIDTH: 0.5 ms
MDC IDC SET LEADCHNL RA PACING AMPLITUDE: 1.625
MDC IDC SET LEADCHNL RV PACING AMPLITUDE: 2.5 V
MDC IDC SET LEADCHNL RV PACING PULSEWIDTH: 0.5 ms
MDC IDC STAT BRADY AP VP PERCENT: 32 %
MDC IDC STAT BRADY AP VS PERCENT: 1 %
MDC IDC STAT BRADY AS VP PERCENT: 66 %
MDC IDC STAT BRADY AS VS PERCENT: 1.4 %

## 2017-01-29 ENCOUNTER — Ambulatory Visit (INDEPENDENT_AMBULATORY_CARE_PROVIDER_SITE_OTHER): Payer: Self-pay

## 2017-01-29 ENCOUNTER — Telehealth: Payer: Self-pay | Admitting: Cardiology

## 2017-01-29 DIAGNOSIS — Z95810 Presence of automatic (implantable) cardiac defibrillator: Secondary | ICD-10-CM

## 2017-01-29 DIAGNOSIS — I5022 Chronic systolic (congestive) heart failure: Secondary | ICD-10-CM

## 2017-01-29 NOTE — Telephone Encounter (Signed)
LMOVM reminding pt to send remote transmission.   

## 2017-02-01 NOTE — Progress Notes (Signed)
EPIC Encounter for ICM Monitoring  Patient Name: [name] is a 62 y.o. female Date: 02/01/2017 Primary Care Physican: Glenda Chroman, MD Primary Cardiologist:Branch Electrophysiologist:Allred Dry Weight:180 lbs( baseline 180 lbs) Bi-V Pacing:98%      Heart Failure questions reviewed, pt asymptomatic now but was symptomatic with shortness of breath during decreased impedance.   Thoracic impedance normal but was abnormal suggesting fluid accumulation from 01/12/2017 to 01/23/2017.  Prescribed dosage: Torsemide20 mg 1 tablet as needed.Taking differently: She is taking Torsemide 20 mg 0.5 tablet (10 mg total) daily.  Takes whole tablet if having fluid symptoms.   Recommendations: No changes.  She will discuss with Dr Harl Bowie about taking Torsemide daily instead of PRN. Encouraged to call for fluid symptoms.  Follow-up plan: ICM clinic phone appointment on 03/06/2017.  Office appointment scheduled 03/07/2017 with Dr. Harl Bowie.  Copy of ICM check sent to Dr. Rayann Heman.   3 month ICM trend: 02/01/2017    1 Year ICM trend:       [name], RN 02/01/2017 2:15 PM

## 2017-03-01 DIAGNOSIS — M542 Cervicalgia: Secondary | ICD-10-CM | POA: Diagnosis not present

## 2017-03-01 DIAGNOSIS — M545 Low back pain: Secondary | ICD-10-CM | POA: Diagnosis not present

## 2017-03-01 DIAGNOSIS — Z79891 Long term (current) use of opiate analgesic: Secondary | ICD-10-CM | POA: Diagnosis not present

## 2017-03-01 DIAGNOSIS — M79602 Pain in left arm: Secondary | ICD-10-CM | POA: Diagnosis not present

## 2017-03-06 ENCOUNTER — Ambulatory Visit (INDEPENDENT_AMBULATORY_CARE_PROVIDER_SITE_OTHER): Payer: Medicare HMO

## 2017-03-06 ENCOUNTER — Telehealth: Payer: Self-pay

## 2017-03-06 DIAGNOSIS — Z95810 Presence of automatic (implantable) cardiac defibrillator: Secondary | ICD-10-CM

## 2017-03-06 DIAGNOSIS — I5022 Chronic systolic (congestive) heart failure: Secondary | ICD-10-CM | POA: Diagnosis not present

## 2017-03-06 NOTE — Telephone Encounter (Signed)
Remote ICM transmission received.  Attempted call to patient and left detailed message per DPR regarding transmission and next ICM scheduled for 04/10/2017.  Advised to return call for any fluid symptoms or questions.

## 2017-03-06 NOTE — Progress Notes (Signed)
EPIC Encounter for ICM Monitoring  Patient Name: [name] is a 62 y.o. female Date: 03/06/2017 Primary Care Physican: Glenda Chroman, MD Primary Cardiologist:Branch Electrophysiologist:Allred Dry Weight:Previous weight 180 lbs( baseline 180 lbs) Bi-V Pacing:98%         Attempted call to patient and unable to reach.  Left detailed message regarding transmission.  Transmission reviewed.    Thoracic impedance normal.  Prescribed dosage: Torsemide20 mg 1 tablet as needed.Taking differently: She is taking Torsemide 20 mg 0.5 tablet (10 mg total) daily.  Takes whole tablet if having fluid symptoms.   Recommendations: Left voice mail with ICM number and encouraged to call if experiencing any fluid symptoms.  Follow-up plan: ICM clinic phone appointment on 04/10/2017.  Office appointment scheduled 03/07/2017 with Dr. Harl Bowie.  Copy of ICM check sent to Dr. Rayann Heman and Dr. Harl Bowie.   3 month ICM trend: 03/05/2017    1 Year ICM trend:       [name], RN 03/06/2017 10:49 AM

## 2017-03-07 ENCOUNTER — Ambulatory Visit (INDEPENDENT_AMBULATORY_CARE_PROVIDER_SITE_OTHER): Payer: Medicare HMO | Admitting: Cardiology

## 2017-03-07 ENCOUNTER — Encounter: Payer: Self-pay | Admitting: Cardiology

## 2017-03-07 VITALS — BP 100/68 | HR 77 | Ht 63.0 in | Wt 192.6 lb

## 2017-03-07 DIAGNOSIS — I5022 Chronic systolic (congestive) heart failure: Secondary | ICD-10-CM | POA: Diagnosis not present

## 2017-03-07 NOTE — Progress Notes (Signed)
Clinical Summary [name] is a 62 y.o.female seen today for follow up of the following medical problems.   1. Chronic systolic HF/NICM - new diagnosis during admit 08/2013, LVEF 15-20%, grade II diastolic dysfunction, moderate TR with PASP 57. Post CRT-D placement LVEF increased to 45-50%.  - cath 09/08/13 patent coronaries with normal filling pressures - echo 12/2014 LVEF 63-78%, grade I diastolic dysfunction - CRT-D placed Jan 2016 by Dr Caryl Comes - medical therapy limited by soft bp's - was started on spironolactone, had severe dizziness and discontinued. ACE-I changed to ARB by Dr Caryl Comes due to cough.    - occasional SOB at times, mainly with higher levels of exertion. - no recent edema.         Past Medical History:  Diagnosis Date  . Anxiety   . Arthritis    "joints; elbows; knees; back; neck:" (08/30/2016)  . Asthma   . Cancer of skin, face   . Chronic back pain    "mid to upper" (08/30/2016)  . Chronic bronchitis ([place])   . Chronic kidney failure, stage 2 (mild)   . Chronic systolic HF (heart failure) ([place])    a. NICM b. RHC (09/08/13): RA: 2, RV 26/0/2, PA: 25/9 (15), PCWP: 9, Fick CO/CI: 4.6 / 2.5, PA 68% c. ECHO (08/2013) EF 20-25%, grade II DD, RV sys fx mildly reduced, mod TR  . COPD (chronic obstructive pulmonary disease) ([place])   . Depression   . Fibromyalgia   . GERD (gastroesophageal reflux disease)   . Hypothyroidism   . ICD (implantable cardioverter-defibrillator), dual, in situ 01/12/2014   [employer] ; for primary prevention for EF<35% by Dr Caryl Comes  . Left bundle [name] block   . NICM (nonischemic cardiomyopathy) ([place])    a. LHC (08/2013): no angiographic evidence of CAD  . OSA on CPAP   . Pneumonia    "several times" (08/30/2016)     Allergies  Allergen Reactions  . Nexium [Esomeprazole Magnesium] Swelling and Rash  . Nucynta [Tapentadol] Itching  . Oxycodone Itching     Current Outpatient Medications  Medication Sig Dispense Refill    . acetaminophen (TYLENOL) 500 MG tablet Take 1,000 mg by mouth 2 (two) times daily as needed for moderate pain or headache.    Marland Kitchen aspirin EC 81 MG EC tablet Take 1 tablet (81 mg total) by mouth daily.    Marland Kitchen atorvastatin (LIPITOR) 20 MG tablet Take 20 mg by mouth at bedtime.    . cetirizine (ZYRTEC) 10 MG tablet Take 10 mg by mouth at bedtime.     . diazepam (VALIUM) 5 MG tablet Take 2.5-5 mg by mouth 2 (two) times daily as needed for anxiety.     . diclofenac sodium (VOLTAREN) 1 % GEL Apply 1 application topically 3 (three) times daily as needed (fibromyalgia).     Marland Kitchen docusate sodium (COLACE) 100 MG capsule Take 200 mg by mouth at bedtime as needed for mild constipation.    Marland Kitchen EPINEPHrine (EPIPEN 2-PAK) 0.3 mg/0.3 mL IJ SOAJ injection Inject 0.3 mLs (0.3 mg total) into the muscle once as needed (for severe allergic reaction). CAll 911 immediately if you have to use this medicine 1 Device 1  . gabapentin (NEURONTIN) 300 MG capsule Take 600 mg by mouth 3 (three) times daily.     Marland Kitchen HYDROcodone-acetaminophen (NORCO) 5-325 MG tablet Take 1-2 tablets by mouth every 6 (six) hours as needed for moderate pain or severe pain. 20 tablet 0  . HYDROcodone-acetaminophen ([place])  7.5-325 MG per tablet Take 1 tablet by mouth every 6 (six) hours.     . hydrocortisone cream 1 % Apply 1 application topically 2 (two) times daily as needed for itching.    . hydrOXYzine (ATARAX/VISTARIL) 10 MG tablet Take 1 tablet (10 mg total) by mouth every 6 (six) hours as needed for itching. 20 tablet 0  . levothyroxine (SYNTHROID, LEVOTHROID) 112 MCG tablet Take 112 mcg by mouth daily before breakfast.    . loperamide (IMODIUM) 2 MG capsule Take 1 capsule (2 mg total) by mouth 4 (four) times daily as needed for diarrhea or loose stools. 30 capsule 0  . losartan (COZAAR) 25 MG tablet Take 0.5 tablets (12.5 mg total) by mouth daily. 45 tablet 3  . metoprolol succinate (TOPROL-XL) 25 MG 24 hr tablet TAKE 1/2 TABLET DAILY 45 tablet 1  .  Multiple Vitamin (MULTIVITAMIN WITH MINERALS) TABS tablet Take 1 tablet by mouth daily.    . Multiple Vitamins-Minerals (WOMENS MULTIVITAMIN) TABS Take 1 tablet by mouth daily.    . polyethylene glycol (MIRALAX / GLYCOLAX) packet Take 17 g by mouth daily as needed for mild constipation.     . Probiotic CAPS Take 1 capsule by mouth daily.    . promethazine (PHENERGAN) 25 MG tablet Take 25 mg by mouth daily as needed for nausea or vomiting.     . ranitidine (ZANTAC) 150 MG tablet Take 150 mg by mouth 2 (two) times daily.     . sertraline (ZOLOFT) 100 MG tablet Take 100 mg by mouth daily.    Marland Kitchen spironolactone (ALDACTONE) 25 MG tablet Take 0.5 tablets (12.5 mg total) by mouth daily. At bedtime 45 tablet 3  . tiZANidine (ZANAFLEX) 4 MG tablet Take 2-4 mg by mouth every 8 (eight) hours as needed for muscle spasms (for fibromyalgia).     . torsemide (DEMADEX) 20 MG tablet Take 1 tablet (20 mg total) by mouth daily as needed (FOR SWELLING). 30 tablet 6  . TURMERIC PO Take 100 mg by mouth daily.     No current facility-administered medications for this visit.      Past Surgical History:  Procedure Laterality Date  . BI-VENTRICULAR IMPLANTABLE CARDIOVERTER DEFIBRILLATOR N/A 01/12/2014   SJM Quadra Assura CRT-D biv ICD implanted by Dr Caryl Comes for primary prevention of sudden death  . CARDIAC CATHETERIZATION    . CHOLECYSTECTOMY N/A 08/30/2016   Procedure: LAPAROSCOPIC CHOLECYSTECTOMY WITH INTRAOPERATIVE CHOLANGIOGRAM;  Surgeon: Fanny Skates, MD;  Location: [facility];  Service: General;  Laterality: N/A;  . FRACTURE SURGERY    . HUMERUS FRACTURE SURGERY Left    "fell after I had my RCR"  . LAPAROSCOPIC CHOLECYSTECTOMY  08/30/2016   w/IOC  . LAPAROSCOPIC GASTRIC BANDING    . LEFT AND RIGHT HEART CATHETERIZATION WITH CORONARY ANGIOGRAM N/A 09/08/2013   Procedure: LEFT AND RIGHT HEART CATHETERIZATION WITH CORONARY ANGIOGRAM;  Surgeon: Burnell Blanks, MD;  Location: [facility] C[facility];  Service:  Cardiovascular;  Laterality: N/A;  . SHOULDER OPEN ROTATOR CUFF REPAIR Left   . TONSILLECTOMY    . TUBAL LIGATION    . VAGINAL HYSTERECTOMY       Allergies  Allergen Reactions  . Nexium [Esomeprazole Magnesium] Swelling and Rash  . Nucynta [Tapentadol] Itching  . Oxycodone Itching      Family History  Problem Relation Age of Onset  . Hypertension Mother        deceased: adenocarcinoma, HLD  . Cancer Father        deceased: basal  cell carcinoma  . Cancer Sister        breast  . Cancer Brother        skin  . Hyperlipidemia Brother      Social History Ms. Cendejas reports that she quit smoking about 28 years ago. Her smoking use included cigarettes. She started smoking about 44 years ago. She has a 16.00 pack-year smoking history. she has never used smokeless tobacco. Ms. Sherrow reports that she drinks alcohol.   Review of Systems CONSTITUTIONAL: No weight loss, fever, chills, weakness or fatigue.  HEENT: Eyes: No visual loss, blurred vision, double vision or yellow sclerae.No hearing loss, sneezing, congestion, runny nose or sore throat.  SKIN: No rash or itching.  CARDIOVASCULAR: per hpi RESPIRATORY: per hpi GASTROINTESTINAL: No anorexia, nausea, vomiting or diarrhea. No abdominal pain or blood.  GENITOURINARY: No burning on urination, no polyuria NEUROLOGICAL: No headache, dizziness, syncope, paralysis, ataxia, numbness or tingling in the extremities. No change in bowel or bladder control.  MUSCULOSKELETAL: No muscle, back pain, joint pain or stiffness.  LYMPHATICS: No enlarged nodes. No history of splenectomy.  PSYCHIATRIC: No history of depression or anxiety.  ENDOCRINOLOGIC: No reports of sweating, cold or heat intolerance. No polyuria or polydipsia.  Marland Kitchen   Physical Examination Vitals:   03/07/17 1459  BP: 100/68  Pulse: 77  SpO2: 98%   Vitals:   03/07/17 1459  Weight: 192 lb 9.6 oz (87.4 kg)  Height: 5\' 3"  (1.6 m)    Gen: resting comfortably, no acute  distress HEENT: no scleral icterus, pupils equal round and reactive, no palptable cervical adenopathy,  CV: RRR, no m/r/g, no jvd Resp: Clear to auscultation bilaterally GI: abdomen is soft, non-tender, non-distended, normal bowel sounds, no hepatosplenomegaly MSK: extremities are warm, no edema.  Skin: warm, no rash Neuro:  no focal deficits Psych: appropriate affect   Diagnostic Studies 09/08/13 Cath Hemodynamic Findings: Ao: 94/66  LV: 93/4/8  RA: 2 RV: 26/0/2  PA: 25/9 (mean 15)  PCWP: 9  Fick Cardiac Output: 4.6 L/min  Fick Cardiac Index: 2.5 L/min/m2  Central Aortic Saturation: 96%  Pulmonary Artery Saturation: 68%  Angiographic Findings:  Left main: No obstructive disease.  Left Anterior Descending Artery: Large caliber vessel that courses to the apex. There are two large caliber diagonal branches. No obstructive disease.  Circumflex Artery: Large caliber vessel with a small caliber first obtuse marginal [name] and a large caliber bifurcating second obtuse marginal [name]. No obstructive disease.  Right Coronary Artery: Large dominant vessel with no obstructive disease.  Left Ventricular Angiogram: Deferred.  Impression:  1. No angiographic evidence of CAD  2. Normal filling pressures.  3. Non-ischemic cardiomyopathy  Recommendations: Continue medical management of her non-ischemic cardiomyopathy. Continue beta blocker. Would add Ace-inh as BP tolerates.  Complications: None; patient tolerated the procedure well.  08/2013 Echo Study Conclusions  - Left ventricle: There is diffuse hypokinesis with akinesis of the entire anterior, basal and mid anterolateral walls and paradoxical septal motion. The cavity size was severely dilated. Systolic function was severely reduced. The estimated ejection fraction was in the range of 15-20%. Features are consistent with a pseudonormal left ventricular filling pattern, with concomitant abnormal relaxation  and increased filling pressure (grade 2 diastolic dysfunction). Doppler parameters are consistent with elevated ventricular end-diastolic filling pressure. - Aortic valve: Trileaflet; normal thickness leaflets. There was no regurgitation. - Aortic root: The aortic root was normal in size. - Left atrium: The atrium was mildly dilated. - Right ventricle: Systolic function was mildly  reduced. - Right atrium: The atrium was normal in size. - Tricuspid valve: There was moderate regurgitation. - Pulmonary arteries: Systolic pressure was moderately to severely increased. PA peak pressure: 57 mm Hg (S).  Impressions:  - Severe dilatation of the left ventricle with severely decreased LVEF 15-20%. Elevated filling pressures. Regional wall motion abnormalities consistent with LBBB and suggestive of infarct in LAD territory. Moderate mitral and tricuspid regurgitation. Mild right ventricular systolic impairement and moderate to severe pulmonary hypertension.     Assessment and Plan   1. Chronic systolic HF - medical therapy limited by soft bp's - no recent symptoms, continue current meds       Arnoldo Lenis, M.D.

## 2017-03-07 NOTE — Patient Instructions (Signed)

## 2017-03-08 ENCOUNTER — Encounter: Payer: Self-pay | Admitting: *Deleted

## 2017-03-11 ENCOUNTER — Encounter: Payer: Self-pay | Admitting: Cardiology

## 2017-03-13 ENCOUNTER — Other Ambulatory Visit: Payer: Self-pay | Admitting: Cardiology

## 2017-03-13 DIAGNOSIS — Z9189 Other specified personal risk factors, not elsewhere classified: Secondary | ICD-10-CM

## 2017-03-13 DIAGNOSIS — I428 Other cardiomyopathies: Secondary | ICD-10-CM

## 2017-03-13 DIAGNOSIS — Z4502 Encounter for adjustment and management of automatic implantable cardiac defibrillator: Secondary | ICD-10-CM

## 2017-03-13 DIAGNOSIS — I5022 Chronic systolic (congestive) heart failure: Secondary | ICD-10-CM

## 2017-03-26 ENCOUNTER — Other Ambulatory Visit: Payer: Self-pay | Admitting: Cardiology

## 2017-03-26 MED ORDER — METOPROLOL SUCCINATE ER 25 MG PO TB24: 12.5000 mg | ORAL_TABLET | Freq: Every day | ORAL | 3 refills | Status: DC

## 2017-03-26 NOTE — Telephone Encounter (Signed)
°*  STAT* If patient is at the pharmacy, call can be transferred to refill team.   1. Which medications need to be refilled?metoprolol succinate (TOPROL-XL) 25 MG 24 hr tablet   2. Which pharmacy/location (including street and city if local pharmacy) is medication to be sent to? [facility] [place], [place]   3. Do they need a 30 day or 90 day supply?

## 2017-04-10 ENCOUNTER — Ambulatory Visit (INDEPENDENT_AMBULATORY_CARE_PROVIDER_SITE_OTHER): Payer: Medicare HMO | Admitting: *Deleted

## 2017-04-10 DIAGNOSIS — I428 Other cardiomyopathies: Secondary | ICD-10-CM

## 2017-04-10 DIAGNOSIS — Z95810 Presence of automatic (implantable) cardiac defibrillator: Secondary | ICD-10-CM | POA: Diagnosis not present

## 2017-04-10 DIAGNOSIS — I5022 Chronic systolic (congestive) heart failure: Secondary | ICD-10-CM

## 2017-04-10 NOTE — Progress Notes (Signed)
Remote ICD transmission.   

## 2017-04-10 NOTE — Progress Notes (Signed)
EPIC Encounter for ICM Monitoring  Patient Name: [name] is a 62 y.o. female Date: 04/10/2017 Primary Care Physican: Glenda Chroman, MD Primary Cardiologist:Branch Electrophysiologist:Allred Dry Weight:189lbs(baseline 180 lbs) Bi-V Pacing:98%      Heart Failure questions reviewed, pt feeling SOB, some leg swelling and weight gain with decreased impedance.     Thoracic impedance normal but was abnormal suggesting fluid accumulation from 03/17/2017 to 04/01/2017.  Prescribed dosage: Torsemide20 mg 1 tablet as needed.Taking differently: She is taking Torsemide 20 mg 0.5 tablet (10 mg total) daily.Takes whole tablet if having fluid symptoms.   Recommendations:  She is going to take extra Torsemide for a couple of days.  Reinforced limiting dietary salt intake and she is aware that she needs to limit the salt.  Encouraged to call for fluid symptoms.  Follow-up plan: ICM clinic phone appointment on 05/11/2017.    Copy of ICM check sent to Dr. Rayann Heman.   3 month ICM trend: 04/10/2017    1 Year ICM trend:       [name], RN 04/10/2017 3:56 PM

## 2017-04-11 ENCOUNTER — Encounter: Payer: Self-pay | Admitting: Cardiology

## 2017-04-23 ENCOUNTER — Other Ambulatory Visit: Payer: Self-pay | Admitting: Cardiology

## 2017-04-23 DIAGNOSIS — I5022 Chronic systolic (congestive) heart failure: Secondary | ICD-10-CM

## 2017-04-23 DIAGNOSIS — Z9189 Other specified personal risk factors, not elsewhere classified: Secondary | ICD-10-CM

## 2017-04-23 DIAGNOSIS — Z4502 Encounter for adjustment and management of automatic implantable cardiac defibrillator: Secondary | ICD-10-CM

## 2017-04-23 DIAGNOSIS — I428 Other cardiomyopathies: Secondary | ICD-10-CM

## 2017-05-03 LAB — CUP PACEART REMOTE DEVICE CHECK
Battery Remaining Percentage: 58 %
Battery Voltage: 2.96 V
Brady Statistic AP VP Percent: 37 %
Brady Statistic AS VP Percent: 61 %
Brady Statistic RA Percent Paced: 38 %
Date Time Interrogation Session: 20190402060020
HIGH POWER IMPEDANCE MEASURED VALUE: 78 Ohm
HighPow Impedance: 78 Ohm
Implantable Lead Implant Date: 20160104
Implantable Lead Location: 753859
Lead Channel Impedance Value: 1200 Ohm
Lead Channel Impedance Value: 440 Ohm
Lead Channel Pacing Threshold Pulse Width: 0.5 ms
Lead Channel Pacing Threshold Pulse Width: 0.5 ms
Lead Channel Sensing Intrinsic Amplitude: 1.5 mV
Lead Channel Sensing Intrinsic Amplitude: 11.6 mV
Lead Channel Setting Pacing Amplitude: 1.625
Lead Channel Setting Pacing Amplitude: 2.5 V
MDC IDC LEAD IMPLANT DT: 20160104
MDC IDC LEAD IMPLANT DT: 20160104
MDC IDC LEAD LOCATION: 753858
MDC IDC LEAD LOCATION: 753860
MDC IDC MSMT BATTERY REMAINING LONGEVITY: 50 mo
MDC IDC MSMT LEADCHNL LV PACING THRESHOLD AMPLITUDE: 0.625 V
MDC IDC MSMT LEADCHNL RA IMPEDANCE VALUE: 510 Ohm
MDC IDC MSMT LEADCHNL RA PACING THRESHOLD AMPLITUDE: 0.625 V
MDC IDC MSMT LEADCHNL RA PACING THRESHOLD PULSEWIDTH: 0.5 ms
MDC IDC MSMT LEADCHNL RV PACING THRESHOLD AMPLITUDE: 0.75 V
MDC IDC PG IMPLANT DT: 20160104
MDC IDC PG SERIAL: 7222096
MDC IDC SET LEADCHNL LV PACING AMPLITUDE: 2 V
MDC IDC SET LEADCHNL LV PACING PULSEWIDTH: 0.5 ms
MDC IDC SET LEADCHNL RV PACING PULSEWIDTH: 0.5 ms
MDC IDC SET LEADCHNL RV SENSING SENSITIVITY: 0.5 mV
MDC IDC STAT BRADY AP VS PERCENT: 1 %
MDC IDC STAT BRADY AS VS PERCENT: 1 %

## 2017-05-07 DIAGNOSIS — Z79891 Long term (current) use of opiate analgesic: Secondary | ICD-10-CM | POA: Diagnosis not present

## 2017-05-07 DIAGNOSIS — M542 Cervicalgia: Secondary | ICD-10-CM | POA: Diagnosis not present

## 2017-05-07 DIAGNOSIS — M545 Low back pain: Secondary | ICD-10-CM | POA: Diagnosis not present

## 2017-05-07 DIAGNOSIS — M79602 Pain in left arm: Secondary | ICD-10-CM | POA: Diagnosis not present

## 2017-05-11 ENCOUNTER — Ambulatory Visit (INDEPENDENT_AMBULATORY_CARE_PROVIDER_SITE_OTHER): Payer: Medicare HMO

## 2017-05-11 DIAGNOSIS — I5022 Chronic systolic (congestive) heart failure: Secondary | ICD-10-CM

## 2017-05-11 DIAGNOSIS — Z95810 Presence of automatic (implantable) cardiac defibrillator: Secondary | ICD-10-CM | POA: Diagnosis not present

## 2017-05-11 NOTE — Progress Notes (Signed)
No ICM remote transmission received for 05/11/2017 and next ICM transmission scheduled for 05/24/2017.

## 2017-05-14 NOTE — Progress Notes (Signed)
EPIC Encounter for ICM Monitoring  Patient Name: [name] is a 62 y.o. female Date: 05/14/2017 Primary Care Physican: Glenda Chroman, MD Primary Cardiologist:Branch Electrophysiologist:Allred Dry Weight:186.5lbs(baseline 180 lbs) Bi-V Pacing:98%       Heart Failure questions reviewed, pt knew she has been having fluid.    Thoracic impedance normal but was abnormal suggesting fluid accumulation from 04/27/2017 - 05/10/2017.  Prescribed dosage:Torsemide20 mg 1 tablet as needed.Taking differently: Torsemide 20 mg every other day alternating with 0.5 tablet every other day  Recommendations: No changes.    Encouraged to call for fluid symptoms.  Follow-up plan: ICM clinic phone appointment on 06/14/2017.    Copy of ICM check sent to Dr. Rayann Heman.   3 month ICM trend: 05/14/2017    1 Year ICM trend:       [name], RN 05/14/2017 9:41 AM

## 2017-06-02 ENCOUNTER — Other Ambulatory Visit: Payer: Self-pay | Admitting: Cardiology

## 2017-06-02 DIAGNOSIS — Z4502 Encounter for adjustment and management of automatic implantable cardiac defibrillator: Secondary | ICD-10-CM

## 2017-06-02 DIAGNOSIS — Z9189 Other specified personal risk factors, not elsewhere classified: Secondary | ICD-10-CM

## 2017-06-02 DIAGNOSIS — I428 Other cardiomyopathies: Secondary | ICD-10-CM

## 2017-06-02 DIAGNOSIS — I5022 Chronic systolic (congestive) heart failure: Secondary | ICD-10-CM
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
## 2017-06-14 ENCOUNTER — Telehealth: Payer: Self-pay | Admitting: Cardiology

## 2017-06-14 NOTE — Telephone Encounter (Signed)
Spoke with pt and reminded pt of remote transmission that is due today. Pt verbalized understanding.   

## 2017-06-18 ENCOUNTER — Encounter (HOSPITAL_COMMUNITY): Payer: Self-pay | Admitting: Psychiatry

## 2017-06-18 ENCOUNTER — Ambulatory Visit (INDEPENDENT_AMBULATORY_CARE_PROVIDER_SITE_OTHER): Payer: Medicare HMO | Admitting: Psychiatry

## 2017-06-18 VITALS — BP 109/74 | HR 78 | Ht 63.0 in | Wt 184.0 lb

## 2017-06-18 DIAGNOSIS — F331 Major depressive disorder, recurrent, moderate: Secondary | ICD-10-CM | POA: Diagnosis not present

## 2017-06-18 DIAGNOSIS — Z62810 Personal history of physical and sexual abuse in childhood: Secondary | ICD-10-CM

## 2017-06-18 DIAGNOSIS — G4700 Insomnia, unspecified: Secondary | ICD-10-CM

## 2017-06-18 DIAGNOSIS — Z560 Unemployment, unspecified: Secondary | ICD-10-CM

## 2017-06-18 DIAGNOSIS — Z95810 Presence of automatic (implantable) cardiac defibrillator: Secondary | ICD-10-CM

## 2017-06-18 DIAGNOSIS — R413 Other amnesia: Secondary | ICD-10-CM

## 2017-06-18 DIAGNOSIS — F1099 Alcohol use, unspecified with unspecified alcohol-induced disorder: Secondary | ICD-10-CM

## 2017-06-18 DIAGNOSIS — I5022 Chronic systolic (congestive) heart failure: Secondary | ICD-10-CM | POA: Diagnosis not present

## 2017-06-18 DIAGNOSIS — I428 Other cardiomyopathies: Secondary | ICD-10-CM

## 2017-06-18 DIAGNOSIS — M797 Fibromyalgia: Secondary | ICD-10-CM

## 2017-06-18 DIAGNOSIS — R450 Nervousness: Secondary | ICD-10-CM | POA: Diagnosis not present

## 2017-06-18 DIAGNOSIS — F410 Panic disorder [episodic paroxysmal anxiety] without agoraphobia: Secondary | ICD-10-CM

## 2017-06-18 DIAGNOSIS — R45851 Suicidal ideations: Secondary | ICD-10-CM | POA: Diagnosis not present

## 2017-06-18 DIAGNOSIS — Z811 Family history of alcohol abuse and dependence: Secondary | ICD-10-CM

## 2017-06-18 DIAGNOSIS — F419 Anxiety disorder, unspecified: Secondary | ICD-10-CM | POA: Diagnosis not present

## 2017-06-18 DIAGNOSIS — Z638 Other specified problems related to primary support group: Secondary | ICD-10-CM

## 2017-06-18 DIAGNOSIS — Z62898 Other specified problems related to upbringing: Secondary | ICD-10-CM

## 2017-06-18 DIAGNOSIS — Z87891 Personal history of nicotine dependence: Secondary | ICD-10-CM

## 2017-06-18 DIAGNOSIS — Z630 Problems in relationship with spouse or partner: Secondary | ICD-10-CM

## 2017-06-18 MED ORDER — SERTRALINE HCL 100 MG PO TABS: 150.0000 mg | ORAL_TABLET | Freq: Every day | ORAL | 0 refills | Status: DC

## 2017-06-18 NOTE — Patient Instructions (Addendum)
1. Increase sertraline 150 mg daily  2. Continue valium 5 mg daily prn for anxiety 3. Referral to IOP 4. Referral to therapy  5. CONTACT INFORMATION  What to do if you need to get in touch with someone regarding a psychiatric issue:  1. EMERGENCY: For psychiatric emergencies (if you are suicidal or if there are any other safety issues) call 911 and/or go to your nearest Emergency Room immediately.   2. IF YOU NEED SOMEONE TO TALK TO RIGHT NOW: Given my clinical responsibilities, I may not be able to speak with you over the phone for a prolonged period of time.  a. You may always call The National Suicide Prevention Lifeline at 1-800-273-TALK 609-677-0724).  b. Your county of residence will also have local crisis services. For [facility]: [place] at [phone]

## 2017-06-19 ENCOUNTER — Telehealth (HOSPITAL_COMMUNITY): Payer: Self-pay | Admitting: Psychiatry

## 2017-06-19 NOTE — Telephone Encounter (Signed)
D:  Dr. Modesta Messing referred pt to [facility].  A:  Placed call to pt ([phone]); but there was no answer.  Left vm for pt to return writer's call.  Inform Dr. Modesta Messing.

## 2017-06-22 NOTE — Progress Notes (Signed)
No ICM remote transmission received for 06/14/2017 and next ICM transmission scheduled for 07/16/2017.

## 2017-06-25 ENCOUNTER — Other Ambulatory Visit (HOSPITAL_COMMUNITY): Payer: Medicare HMO | Attending: Psychiatry | Admitting: Psychiatry

## 2017-06-25 ENCOUNTER — Encounter (HOSPITAL_COMMUNITY): Payer: Self-pay | Admitting: Psychiatry

## 2017-06-25 DIAGNOSIS — K219 Gastro-esophageal reflux disease without esophagitis: Secondary | ICD-10-CM | POA: Diagnosis not present

## 2017-06-25 DIAGNOSIS — J449 Chronic obstructive pulmonary disease, unspecified: Secondary | ICD-10-CM | POA: Diagnosis not present

## 2017-06-25 DIAGNOSIS — Z95810 Presence of automatic (implantable) cardiac defibrillator: Secondary | ICD-10-CM | POA: Diagnosis not present

## 2017-06-25 DIAGNOSIS — E039 Hypothyroidism, unspecified: Secondary | ICD-10-CM | POA: Diagnosis not present

## 2017-06-25 DIAGNOSIS — Z811 Family history of alcohol abuse and dependence: Secondary | ICD-10-CM | POA: Insufficient documentation

## 2017-06-25 DIAGNOSIS — Z808 Family history of malignant neoplasm of other organs or systems: Secondary | ICD-10-CM | POA: Insufficient documentation

## 2017-06-25 DIAGNOSIS — G4733 Obstructive sleep apnea (adult) (pediatric): Secondary | ICD-10-CM | POA: Insufficient documentation

## 2017-06-25 DIAGNOSIS — Z79899 Other long term (current) drug therapy: Secondary | ICD-10-CM | POA: Insufficient documentation

## 2017-06-25 DIAGNOSIS — F329 Major depressive disorder, single episode, unspecified: Secondary | ICD-10-CM | POA: Insufficient documentation

## 2017-06-25 DIAGNOSIS — Z79890 Hormone replacement therapy (postmenopausal): Secondary | ICD-10-CM | POA: Insufficient documentation

## 2017-06-25 DIAGNOSIS — Z87891 Personal history of nicotine dependence: Secondary | ICD-10-CM | POA: Diagnosis not present

## 2017-06-25 DIAGNOSIS — Z803 Family history of malignant neoplasm of breast: Secondary | ICD-10-CM | POA: Insufficient documentation

## 2017-06-25 DIAGNOSIS — Z888 Allergy status to other drugs, medicaments and biological substances status: Secondary | ICD-10-CM | POA: Insufficient documentation

## 2017-06-25 DIAGNOSIS — Z7982 Long term (current) use of aspirin: Secondary | ICD-10-CM | POA: Insufficient documentation

## 2017-06-25 DIAGNOSIS — Z79891 Long term (current) use of opiate analgesic: Secondary | ICD-10-CM | POA: Insufficient documentation

## 2017-06-25 DIAGNOSIS — I447 Left bundle-branch block, unspecified: Secondary | ICD-10-CM | POA: Diagnosis not present

## 2017-06-25 DIAGNOSIS — Z8249 Family history of ischemic heart disease and other diseases of the circulatory system: Secondary | ICD-10-CM | POA: Insufficient documentation

## 2017-06-25 DIAGNOSIS — I5022 Chronic systolic (congestive) heart failure: Secondary | ICD-10-CM | POA: Diagnosis not present

## 2017-06-25 DIAGNOSIS — Z885 Allergy status to narcotic agent status: Secondary | ICD-10-CM | POA: Insufficient documentation

## 2017-06-25 DIAGNOSIS — Z9049 Acquired absence of other specified parts of digestive tract: Secondary | ICD-10-CM | POA: Diagnosis not present

## 2017-06-25 DIAGNOSIS — F419 Anxiety disorder, unspecified: Secondary | ICD-10-CM | POA: Diagnosis not present

## 2017-06-25 DIAGNOSIS — F331 Major depressive disorder, recurrent, moderate: Secondary | ICD-10-CM

## 2017-06-25 DIAGNOSIS — Z9889 Other specified postprocedural states: Secondary | ICD-10-CM | POA: Diagnosis not present

## 2017-06-25 NOTE — Progress Notes (Signed)
    Daily Group Progress Note  Program: IOP  Group Time: 9:00-12:00  Participation Level: Active  Behavioral Response: Appropriate  Type of Therapy:  Group Therapy  Summary of Progress: Pt.'s first day in group. Pt. Presented as quiet, and alert. Pt. Completed intake assessment with the case manager. Pt. Shared her history of being a people pleaser and feeling used by people who had no genuine interest in her. Pt.  participated in discussion about resisting pressure to people please. Pt. Participated in medication management education group with the pharmacist.      Nancie Neas, [facility]
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
## 2017-06-26 ENCOUNTER — Other Ambulatory Visit (HOSPITAL_COMMUNITY): Payer: Medicare HMO | Admitting: Family

## 2017-06-26 DIAGNOSIS — F331 Major depressive disorder, recurrent, moderate: Secondary | ICD-10-CM

## 2017-06-26 DIAGNOSIS — F329 Major depressive disorder, single episode, unspecified: Secondary | ICD-10-CM | POA: Diagnosis not present

## 2017-06-26 NOTE — Progress Notes (Signed)
    Daily Group Progress Note  Program: IOP  Group Time: 9:00-12:00  Participation Level: Active  Behavioral Response: Appropriate  Type of Therapy:  Group Therapy  Summary of Progress: Pt. Presented as alert, observant and engaged in the group process. Pt. Shared her heartbreak related to the end of of relationship with a boyfriend, learning to trust herself again, and fears that a new relationship will not be available to her because of her age. Pt. Participated in grief and loss session with the Chaplain.     Nancie Neas, LPC
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
## 2017-06-27 ENCOUNTER — Encounter (HOSPITAL_COMMUNITY): Payer: Self-pay | Admitting: Family

## 2017-06-27 ENCOUNTER — Other Ambulatory Visit (HOSPITAL_COMMUNITY): Payer: Medicare HMO | Admitting: Psychiatry

## 2017-06-27 DIAGNOSIS — F331 Major depressive disorder, recurrent, moderate: Secondary | ICD-10-CM

## 2017-06-27 DIAGNOSIS — F329 Major depressive disorder, single episode, unspecified: Secondary | ICD-10-CM | POA: Diagnosis not present

## 2017-06-27 NOTE — Progress Notes (Signed)
    Daily Group Progress Note  Program: IOP  Group Time: 9:00-12:00  Participation Level: Active  Behavioral Response: Appropriate  Type of Therapy:  Group Therapy  Summary of Progress: Pt. Presented as talkative, smiled appropriately, engaged in the group process. Pt. Shared her frustration with family members who ask to borrow money from her, do not pay her back and make assumptions about her ability to help them financially. Pt. Participated in discussion about learning to say "No" and developing healthy interpersonal boundaries. Pt. Participated in wellness session facilitated by Frederich Balding; developing proper nutrition, regular exercise and sleep habits were discussed.     Nancie Neas, LPC

## 2017-06-28 ENCOUNTER — Other Ambulatory Visit: Payer: Self-pay | Admitting: Cardiology

## 2017-06-28 ENCOUNTER — Other Ambulatory Visit (HOSPITAL_COMMUNITY): Payer: Medicare HMO | Admitting: Psychiatry

## 2017-06-28 DIAGNOSIS — I5022 Chronic systolic (congestive) heart failure: Secondary | ICD-10-CM

## 2017-06-28 DIAGNOSIS — F329 Major depressive disorder, single episode, unspecified: Secondary | ICD-10-CM | POA: Diagnosis not present

## 2017-06-28 DIAGNOSIS — F331 Major depressive disorder, recurrent, moderate: Secondary | ICD-10-CM

## 2017-06-28 MED ORDER — TORSEMIDE 20 MG PO TABS: 20.0000 mg | ORAL_TABLET | Freq: Every day | ORAL | 6 refills | Status: DC | PRN

## 2017-06-28 NOTE — Telephone Encounter (Signed)
Patient called requesting a refill on torsemide (DEMADEX) 20 MG tablet  [facility]  [place]

## 2017-06-28 NOTE — Progress Notes (Signed)
    Daily Group Progress Note  Program: IOP  Group Time: 9:00-12:00  Participation Level: Active  Behavioral Response: Appropriate  Type of Therapy:  Group Therapy  Summary of Progress: Pt. Presented as talkative, engaged in the group process. Pt. Shared her loss and grief related to end of long-term relationship. Pt. Participated in discussion about identifying barriers to meeting goals and discussion of [place] of Rights.     Nancie Neas, LPC

## 2017-06-28 NOTE — Telephone Encounter (Signed)
Done

## 2017-06-29 ENCOUNTER — Other Ambulatory Visit (HOSPITAL_COMMUNITY): Payer: Medicare HMO | Admitting: Psychiatry

## 2017-06-29 DIAGNOSIS — F331 Major depressive disorder, recurrent, moderate: Secondary | ICD-10-CM

## 2017-06-29 DIAGNOSIS — F329 Major depressive disorder, single episode, unspecified: Secondary | ICD-10-CM | POA: Diagnosis not present

## 2017-07-02 ENCOUNTER — Other Ambulatory Visit (HOSPITAL_COMMUNITY): Payer: Medicare HMO | Admitting: Psychiatry

## 2017-07-02 DIAGNOSIS — F329 Major depressive disorder, single episode, unspecified: Secondary | ICD-10-CM | POA: Diagnosis not present

## 2017-07-02 DIAGNOSIS — F331 Major depressive disorder, recurrent, moderate: Secondary | ICD-10-CM

## 2017-07-03 ENCOUNTER — Other Ambulatory Visit (HOSPITAL_COMMUNITY): Payer: Medicare HMO | Admitting: Psychiatry

## 2017-07-03 DIAGNOSIS — F331 Major depressive disorder, recurrent, moderate: Secondary | ICD-10-CM

## 2017-07-03 DIAGNOSIS — F329 Major depressive disorder, single episode, unspecified: Secondary | ICD-10-CM | POA: Diagnosis not present

## 2017-07-03 NOTE — Progress Notes (Signed)
    Daily Group Progress Note  Program: IOP  Group Time: 9:00-12:00  Participation Level: Active  Behavioral Response: Appropriate  Type of Therapy:  Group Therapy  Summary of Progress: Pt. Presented with bright affect, talkative, engaged in the group process. Pt. Discussed that she was feeling better and was looking forward to riding her [employer] Daft and going to a formal event over the weekend. Pt. Discussed the challenge of missing her work and needing to find a support system in her hometown. Pt. Participated in discussion about rediscovering laughter and self-care activities.     Nancie Neas, LPC

## 2017-07-03 NOTE — Progress Notes (Signed)
    Daily Group Progress Note  Program: IOP  Group Time: 9:00-12:00  Participation Level: Active  Behavioral Response: Appropriate  Type of Therapy:  Group Therapy  Summary of Progress:  Pt. Presented with bright affect, talkative, engaged int he group process. Pt. Connected with other group members about having poor relationship boundaries and losing self in the midst of a difficult relationship and break-up. Pt. Participated in discussion about bibliotherapy (I.e., Co-Dependent No More, Five Love Languages, and Getting the Love that You Want) to work through relationship issues. Pt. Participated in grief and loss session with the Chaplain.    Nancie Neas, LPC

## 2017-07-03 NOTE — Patient Instructions (Signed)
D:  Patient will successfully complete MH-IOP on 07-09-17.  A:  Follow up with Dr. Modesta Messing on 07-30-17 @ 9 a.m.  Encouraged support groups.  Recommended [facility] After[facility] with Binnie Rail, LCAS on Tuesdays 5:30 - 6:30 pm.  [phone].*Call her before attending.  R:  Patient receptive.

## 2017-07-03 NOTE — Progress Notes (Signed)
    Daily Group Progress Note  Program: IOP Time: 9:00-12:00  Type of Therapy:  Psychoeducational Skills   Participation Level:  Active   Participation Quality:  Appropriate   Affect:  Appropriate   Cognitive:  Appropriate   Insight:  Appropriate   Engagement in Group:  Engaged   Modes of Intervention:  Problem-solving  [name] is a 62 y.o. is a female that presented to Psych IOP. Counselor utilized motivation interviewing skills and group processing as a means for therapeutic intervention. Pt has an hx of depression, grief, and anxiety This first and second hour of today's session focused on "Mind Set Matters" with focus on changing a "Fixed Mindset" to a "Growth Mindset".  Counselor elicited examples/feedback from patient and other group members to share experiences that are associated with moving toward a growth. Counselor provided a handout to provided examples. Clients were then asked to practice Mindset Matters" with each other. Client shared an example, "Instead of saying I can't do it I will say I'm right on track" or "Instead of stating I'm not good enough I will say I'm just getting started".  Counselor and group praised Raymonda for her motivation towards self-improvement.  Pt is dressed in street clothes, alert, oriented x4 with normal speech and normal motor behavior. Eye contact is fair. Pt's mood is appropriate and affect is congruent with mood. Thought process is coherent and relevant. Pt's insight is fair and judgement is fair. There is no indication that patient  is currently responding to internal stimuli or experiencing delusional thought content. Pt was cooperative throughout assessment and relevant. Pt's insight is fair and judgement is fair. Pt was cooperative throughout the group session on this day.  Nancie Neas, LPC

## 2017-07-04 ENCOUNTER — Other Ambulatory Visit (HOSPITAL_COMMUNITY): Payer: Medicare HMO | Admitting: Family

## 2017-07-05 ENCOUNTER — Other Ambulatory Visit (HOSPITAL_COMMUNITY): Payer: Medicare HMO

## 2017-07-06 ENCOUNTER — Other Ambulatory Visit (HOSPITAL_COMMUNITY): Payer: Medicare HMO | Admitting: Family

## 2017-07-06 NOTE — Progress Notes (Deleted)
  [facility] Discharge Summary  [name] [id number]  Admission date: *** Discharge date: 07/06/2017  Reason for admission: ***  Chemical Use History: ***  Family of Origin Issues: ***  Progress in Program Toward Treatment Goals: ***  Progress (rationale): *** Dr. Modesta Messing    Take all medications as prescribed. Keep all follow-up appointments as scheduled.  Do not consume alcohol or use illegal drugs while on prescription medications. Report any adverse effects from your medications to your primary care provider promptly.  In the event of recurrent symptoms or worsening symptoms, call 911, a crisis hotline, or go to the nearest emergency department for evaluation.   Derrill Center, NP 07/06/2017

## 2017-07-09 ENCOUNTER — Encounter (HOSPITAL_COMMUNITY): Payer: Self-pay | Admitting: Family

## 2017-07-09 ENCOUNTER — Other Ambulatory Visit (HOSPITAL_COMMUNITY): Payer: Medicare HMO | Attending: Psychiatry | Admitting: Psychiatry

## 2017-07-09 DIAGNOSIS — F331 Major depressive disorder, recurrent, moderate: Secondary | ICD-10-CM

## 2017-07-09 NOTE — Progress Notes (Signed)
  [facility] Discharge Summary  [name] [id number]  Admission Morgan: 06/25/2017 Discharge Morgan: 07/06/2017  Reason for admission: Depression    Per assessment note:[name] 62 year old Caucasian female presents for worsening depression and anxiety.  Reports a recent break-up with her long-term boyfriend who recently got a divorce.  Reports her boyfriend lives in [place] and states he emailed her and told her that it was over between them.  Patient reports she is unsure what happened during the relationship or why he decided to break-up with her as he recently filed for divorce between his wife.  She reports her relationship has been on and off for many years and they rekindled for 5 years ago.  Reports this event is causing her to have severe depression and anxiety as she has so many unanswered questions.  Patient reports she is currently followed by a psychiatrist and a therapist.  Reports she was prescribed Neurontin and Zoloft she reports she is taking and tolerating medications well has a prescription for Valium for severe anxiety.    Chemical Use History: was denied   Progress in Program Toward Treatment Goals: Attended and participated in daily group session.  As she reports group session has been helpful with coping skills.  Patient was previously admitted to intensive outpatient programming before.  Patient scheduled discharge 07/06/2017 however was not in attendance on that Morgan.  Patient to continue taking medication as prescribed and keep all follow-up appointment with therapist and psychiatrist.  Progress (rationale): ongoing, [name] to keep f/u appt with MD [name]   Take all medications as prescribed. Keep all follow-up appointments as scheduled.  Do not consume alcohol or use illegal drugs while on prescription medications. Report any adverse effects from your medications to your primary care provider promptly.  In the event of  recurrent symptoms or worsening symptoms, call 911, a crisis hotline, or go to the nearest emergency department for evaluation.     Derrill Center, NP 07/09/2017

## 2017-07-10 ENCOUNTER — Other Ambulatory Visit (HOSPITAL_COMMUNITY): Payer: Medicare HMO

## 2017-07-10 NOTE — Progress Notes (Signed)
[name] is a 62 y.o. , widowed, retired, Caucasian female who was referred per Dr. Modesta Messing due to worsening depressive and anxiety symptoms.  Continues to deny SI/HI or A/V hallucinations.  Stressors:  1) Loss of relationship:  According to pt, she was in a relationship with a married man for three years.  He lived in [place], but would communicate with patient everyday.  On March 23, 2017; pt received a text stating that he had to end the relationship and for her not to try and contact him.  "He even went so far to say that his doctor suggested that he end our relationship."  Pt went on to say that she had know him since age 62 and they had dated for three years back then.  "He went his way and got married and so did I; but we found eachother again through his sister.  He met my kids and family.  He would spend the holidays with [name].  He was just with [name] at Christmas and had decided he would leave his wife."  According to pt, whenever he went back home (Dec. 2018), he discovered that his wife had packed up and left him d/t the affair.  Pt states she feels "broken."  2)  Assists with caregiving for oldest son.  He has neuropathy in both legs.  Has had three surgeries.  He is severely depressed and sees a therapist and psychiatrist.  "He can't drive or work; so I assist him financially."  3) Medical Issues:  Fibromyalgia (currently in pain mgmt), chronic pain, heart issues (has pacemaker/defibilator), stage 2 kidney failure.  Denies any prior psychiatric hospitalizations.  Has only seen Dr. Modesta Messing for one visit; never seen a therapist.  Family Issues:  Father (ETOH).   Reports overall mood improved.  Admission [place] Depression Checklist was 39 and D/C Checklist was a score of 7.  States the group really helped with talking about her issues.  Pt is excited about her upcoming trip to [place].  "I will end up with visiting with my youngest son for one week.  A:  D/C'd yesterday.  F/U with Dr. Modesta Messing 07-30-17 @ 9 a.m.   Pt would like to discuss therapist options with Dr. Modesta Messing first before being scheduled.  Encouraged support groups.  Recommended the aftercare group with [name], LCAS.  R:  Pt receptive.                      Carlis Abbott, RITA, M.Ed,CNA

## 2017-07-11 ENCOUNTER — Other Ambulatory Visit (HOSPITAL_COMMUNITY): Payer: Medicare HMO

## 2017-07-13 ENCOUNTER — Other Ambulatory Visit (HOSPITAL_COMMUNITY): Payer: Medicare HMO

## 2017-07-16 ENCOUNTER — Ambulatory Visit (INDEPENDENT_AMBULATORY_CARE_PROVIDER_SITE_OTHER): Payer: Medicare HMO | Admitting: *Deleted

## 2017-07-16 ENCOUNTER — Other Ambulatory Visit (HOSPITAL_COMMUNITY): Payer: Medicare HMO

## 2017-07-16 DIAGNOSIS — I428 Other cardiomyopathies: Secondary | ICD-10-CM | POA: Diagnosis not present

## 2017-07-17 ENCOUNTER — Other Ambulatory Visit (HOSPITAL_COMMUNITY): Payer: Medicare HMO

## 2017-07-17 NOTE — Progress Notes (Signed)
Remote ICD transmission.   

## 2017-07-18 ENCOUNTER — Other Ambulatory Visit (HOSPITAL_COMMUNITY): Payer: Medicare HMO

## 2017-07-18 ENCOUNTER — Other Ambulatory Visit (HOSPITAL_COMMUNITY): Payer: Self-pay | Admitting: Psychiatry

## 2017-07-18 MED ORDER — SERTRALINE HCL 100 MG PO TABS
150.0000 mg | ORAL_TABLET | Freq: Every day | ORAL | 0 refills | Status: DC
Start: 2017-07-18 — End: 2017-07-30

## 2017-07-19 ENCOUNTER — Other Ambulatory Visit (HOSPITAL_COMMUNITY): Payer: Medicare HMO

## 2017-07-20 ENCOUNTER — Other Ambulatory Visit (HOSPITAL_COMMUNITY): Payer: Medicare HMO

## 2017-07-20 NOTE — Progress Notes (Signed)
  No ICM remote transmission received for 07/16/2017 and next ICM transmission scheduled for 08/13/2017.

## 2017-07-22 LAB — CUP PACEART REMOTE DEVICE CHECK
Battery Remaining Longevity: 48 mo
Battery Remaining Percentage: 54 %
Brady Statistic AS VS Percent: 1 %
Date Time Interrogation Session: 20190702151225
HIGH POWER IMPEDANCE MEASURED VALUE: 79 Ohm
HighPow Impedance: 79 Ohm
Implantable Lead Implant Date: 20160104
Implantable Lead Implant Date: 20160104
Implantable Lead Location: 753858
Implantable Lead Location: 753859
Implantable Lead Location: 753860
Implantable Lead Model: 7122
Implantable Pulse Generator Implant Date: 20160104
Lead Channel Impedance Value: 510 Ohm
Lead Channel Pacing Threshold Amplitude: 0.625 V
Lead Channel Pacing Threshold Amplitude: 0.625 V
Lead Channel Pacing Threshold Amplitude: 0.75 V
Lead Channel Pacing Threshold Pulse Width: 0.5 ms
Lead Channel Pacing Threshold Pulse Width: 0.5 ms
Lead Channel Sensing Intrinsic Amplitude: 2.5 mV
Lead Channel Setting Pacing Amplitude: 2 V
Lead Channel Setting Pacing Amplitude: 2.5 V
Lead Channel Setting Pacing Pulse Width: 0.5 ms
Lead Channel Setting Pacing Pulse Width: 0.5 ms
MDC IDC LEAD IMPLANT DT: 20160104
MDC IDC MSMT BATTERY VOLTAGE: 2.96 V
MDC IDC MSMT LEADCHNL LV IMPEDANCE VALUE: 1150 Ohm
MDC IDC MSMT LEADCHNL RV IMPEDANCE VALUE: 440 Ohm
MDC IDC MSMT LEADCHNL RV PACING THRESHOLD PULSEWIDTH: 0.5 ms
MDC IDC MSMT LEADCHNL RV SENSING INTR AMPL: 10.7 mV
MDC IDC SET LEADCHNL RA PACING AMPLITUDE: 1.625
MDC IDC SET LEADCHNL RV SENSING SENSITIVITY: 0.5 mV
MDC IDC STAT BRADY AP VP PERCENT: 38 %
MDC IDC STAT BRADY AP VS PERCENT: 1 %
MDC IDC STAT BRADY AS VP PERCENT: 61 %
MDC IDC STAT BRADY RA PERCENT PACED: 38 %
Pulse Gen Serial Number: 7222096

## 2017-07-23 ENCOUNTER — Other Ambulatory Visit (HOSPITAL_COMMUNITY): Payer: Medicare HMO

## 2017-07-24 ENCOUNTER — Other Ambulatory Visit (HOSPITAL_COMMUNITY): Payer: Medicare HMO

## 2017-07-25 ENCOUNTER — Other Ambulatory Visit (HOSPITAL_COMMUNITY): Payer: Medicare HMO

## 2017-07-26 ENCOUNTER — Other Ambulatory Visit (HOSPITAL_COMMUNITY): Payer: Medicare HMO

## 2017-07-26 NOTE — Progress Notes (Signed)
BH MD/PA/NP OP Progress Note  07/30/2017 9:37 AM [name]  MRN:  [id number]  Chief Complaint:  Chief Complaint    Follow-up; Depression; Anxiety     HPI:  Patient completed IOP.  Patient presents for follow-up appointment for depression.  She states that she felt IOP was helpful, although she initially thought she would not be open to other people.  She also notices that her issue is smaller than other people, which made her feel guilty.  She feels that she passed the crying stage. She has not checked her phone to see if she receives text message as frequently. She believes that she is getting back to herself and hopes to "live the life again." She have recently been back from vacation; she was in [place] with her friend, and also visited her son in [place]. She enjoyed those time. She reports her frustration against her sister in law, who texted messages many times and made comment about her son. She feels less depressed. She has more motivation and energy. She occasionally has insomnia. She denies SI. She feels less anxious. She denies panic attacks. She took valium a few times for anxiety.    Per PMP,  On hydrocodone, diazepam filled on 03/26/2017   Visit Diagnosis:    ICD-10-CM   1. MDD (major depressive disorder), recurrent episode, moderate (HCC) F33.1 sertraline (ZOLOFT) 100 MG tablet    Past Psychiatric History: Please see initial evaluation for full details. I have reviewed the history. No updates at this time.     Past Medical History:  Past Medical History:  Diagnosis Date  . Anxiety   . Arthritis    "joints; elbows; knees; back; neck:" (08/30/2016)  . Asthma   . Cancer of skin, face   . Chronic back pain    "mid to upper" (08/30/2016)  . Chronic bronchitis ([facility])   . Chronic kidney failure, stage 2 (mild)   . Chronic systolic HF (heart failure) ([facility])    a. NICM b. RHC (09/08/13): RA: 2, RV 26/0/2, PA: 25/9 (15), PCWP: 9, Fick CO/CI: 4.6 / 2.5, PA 68% c. ECHO (08/2013)  EF 20-25%, grade II DD, RV sys fx mildly reduced, mod TR  . COPD (chronic obstructive pulmonary disease) ([facility])   . Depression   . Fibromyalgia   . GERD (gastroesophageal reflux disease)   . Hypothyroidism   . ICD (implantable cardioverter-defibrillator), dual, in situ 01/12/2014   St. Jude ; for primary prevention for EF<35% by Dr Caryl Comes  . Left bundle branch block   . NICM (nonischemic cardiomyopathy) ([facility])    a. LHC (08/2013): no angiographic evidence of CAD  . OSA on CPAP   . Pneumonia    "several times" (08/30/2016)    Past Surgical History:  Procedure Laterality Date  . BI-VENTRICULAR IMPLANTABLE CARDIOVERTER DEFIBRILLATOR N/A 01/12/2014   SJM Quadra Assura CRT-D biv ICD implanted by Dr Caryl Comes for primary prevention of sudden death  . CARDIAC CATHETERIZATION    . CHOLECYSTECTOMY N/A 08/30/2016   Procedure: LAPAROSCOPIC CHOLECYSTECTOMY WITH INTRAOPERATIVE CHOLANGIOGRAM;  Surgeon: Fanny Skates, MD;  Location: [facility];  Service: General;  Laterality: N/A;  . FRACTURE SURGERY    . HUMERUS FRACTURE SURGERY Left    "fell after I had my RCR"  . LAPAROSCOPIC CHOLECYSTECTOMY  08/30/2016   w/IOC  . LAPAROSCOPIC GASTRIC BANDING    . LEFT AND RIGHT HEART CATHETERIZATION WITH CORONARY ANGIOGRAM N/A 09/08/2013   Procedure: LEFT AND RIGHT HEART CATHETERIZATION WITH CORONARY ANGIOGRAM;  Surgeon: Annita Brod  Angelena Form, MD;  Location: [facility];  Service: Cardiovascular;  Laterality: N/A;  . SHOULDER OPEN ROTATOR CUFF REPAIR Left   . TONSILLECTOMY    . TUBAL LIGATION    . VAGINAL HYSTERECTOMY      Family Psychiatric History: Please see initial evaluation for full details. I have reviewed the history. No updates at this time.     Family History:  Family History  Problem Relation Age of Onset  . Hypertension Mother        deceased: adenocarcinoma, HLD  . Cancer Father        deceased: basal cell carcinoma  . Alcohol abuse Father   . Cancer Sister        breast  . Cancer Brother         skin  . Hyperlipidemia Brother     Social History:  Social History   Socioeconomic History  . Marital status: Widowed    Spouse name: Not on file  . Number of children: Not on file  . Years of education: Not on file  . Highest education level: Not on file  Occupational History  . Not on file  Social Needs  . Financial resource strain: Not on file  . Food insecurity:    Worry: Not on file    Inability: Not on file  . Transportation needs:    Medical: Not on file    Non-medical: Not on file  Tobacco Use  . Smoking status: Former Smoker    Packs/day: 1.00    Years: 16.00    Pack years: 16.00    Types: Cigarettes    Start date: 04/22/1972    Last attempt to quit: 09/25/1988    Years since quitting: 28.8  . Smokeless tobacco: Never Used  Substance and Sexual Activity  . Alcohol use: Yes    Alcohol/week: 0.0 oz    Comment: 08/30/2016 "mixed drink q 2-3 months"  . Drug use: No  . Sexual activity: Not Currently  Lifestyle  . Physical activity:    Days per week: Not on file    Minutes per session: Not on file  . Stress: Not on file  Relationships  . Social connections:    Talks on phone: Not on file    Gets together: Not on file    Attends religious service: Not on file    Active member of club or organization: Not on file    Attends meetings of clubs or organizations: Not on file    Relationship status: Not on file  Other Topics Concern  . Not on file  Social History Narrative   Lives in [place] by herself and daughter lives next door. Retired [employer]    Allergies:  Allergies  Allergen Reactions  . Nexium [Esomeprazole Magnesium] Swelling and Rash  . Nucynta [Tapentadol] Itching  . Oxycodone Itching    Metabolic Disorder Labs: No results found for: HGBA1C, MPG No results found for: PROLACTIN Lab Results  Component Value Date   CHOL 226 (H) 01/18/2014   TRIG 202 (H) 01/18/2014   HDL 62 01/18/2014   CHOLHDL 3.6 01/18/2014   VLDL 40 01/18/2014   LDLCALC 124 (H)  01/18/2014   Lab Results  Component Value Date   TSH 0.841 06/20/2016   TSH 0.085 (L) 09/11/2014    Therapeutic Level Labs: No results found for: LITHIUM No results found for: VALPROATE No components found for:  CBMZ  Current Medications: Current Outpatient Medications  Medication Sig Dispense Refill  . acetaminophen (TYLENOL)  500 MG tablet Take 1,000 mg by mouth 2 (two) times daily as needed for moderate pain or headache.    Marland Kitchen aspirin EC 81 MG EC tablet Take 1 tablet (81 mg total) by mouth daily.    Marland Kitchen atorvastatin (LIPITOR) 20 MG tablet Take 20 mg by mouth at bedtime.    . cetirizine (ZYRTEC) 10 MG tablet Take 10 mg by mouth at bedtime.     . diazepam (VALIUM) 5 MG tablet Take 2.5-5 mg by mouth 2 (two) times daily as needed for anxiety.     . diclofenac sodium (VOLTAREN) 1 % GEL Apply 1 application topically 3 (three) times daily as needed (fibromyalgia).     Marland Kitchen docusate sodium (COLACE) 100 MG capsule Take 200 mg by mouth at bedtime as needed for mild constipation.    Marland Kitchen EPINEPHrine (EPIPEN 2-PAK) 0.3 mg/0.3 mL IJ SOAJ injection Inject 0.3 mLs (0.3 mg total) into the muscle once as needed (for severe allergic reaction). CAll 911 immediately if you have to use this medicine 1 Device 1  . gabapentin (NEURONTIN) 300 MG capsule Take 600 mg by mouth 3 (three) times daily.     Marland Kitchen HYDROcodone-acetaminophen (NORCO) 7.5-325 MG per tablet Take 1 tablet by mouth every 6 (six) hours.     . hydrocortisone cream 1 % Apply 1 application topically 2 (two) times daily as needed for itching.    . hydrOXYzine (ATARAX/VISTARIL) 10 MG tablet Take 1 tablet (10 mg total) by mouth every 6 (six) hours as needed for itching. 20 tablet 0  . levothyroxine (SYNTHROID, LEVOTHROID) 112 MCG tablet Take 112 mcg by mouth daily before breakfast.    . loperamide (IMODIUM) 2 MG capsule Take 1 capsule (2 mg total) by mouth 4 (four) times daily as needed for diarrhea or loose stools. 30 capsule 0  . losartan (COZAAR) 25 MG  tablet TAKE 1/2 TABLET BY MOUTH DAILY 45 tablet 4  . metoprolol succinate (TOPROL-XL) 25 MG 24 hr tablet Take 0.5 tablets (12.5 mg total) by mouth daily. 45 tablet 3  . Multiple Vitamin (MULTIVITAMIN WITH MINERALS) TABS tablet Take 1 tablet by mouth daily.    . Multiple Vitamins-Minerals (WOMENS MULTIVITAMIN) TABS Take 1 tablet by mouth daily.    Marland Kitchen OVER THE COUNTER MEDICATION Take by mouth 2 (two) times daily. AND RED :  VITAMIN: MACULUAR DEGENERATION    . polyethylene glycol (MIRALAX / GLYCOLAX) packet Take 17 g by mouth daily as needed for mild constipation.     . Probiotic CAPS Take 1 capsule by mouth daily.    . promethazine (PHENERGAN) 25 MG tablet Take 25 mg by mouth daily as needed for nausea or vomiting.     . ranitidine (ZANTAC) 150 MG tablet Take 150 mg by mouth 2 (two) times daily.     . sertraline (ZOLOFT) 100 MG tablet Take 1.5 tablets (150 mg total) by mouth daily. 135 tablet 0  . spironolactone (ALDACTONE) 25 MG tablet TAKE 1/2 TABLET BY MOUTH AT BEDTIME 45 tablet 3  . tiZANidine (ZANAFLEX) 4 MG tablet Take 2-4 mg by mouth every 8 (eight) hours as needed for muscle spasms (for fibromyalgia).     . torsemide (DEMADEX) 20 MG tablet Take 1 tablet (20 mg total) by mouth daily as needed (FOR SWELLING). 30 tablet 6  . TURMERIC PO Take 100 mg by mouth daily.     No current facility-administered medications for this visit.      Musculoskeletal: Strength & Muscle Tone: within normal limits Gait &  Station: normal Patient leans: N/A  Psychiatric Specialty Exam: Review of Systems  Psychiatric/Behavioral: Positive for depression. Negative for hallucinations, memory loss, substance abuse and suicidal ideas. The patient is nervous/anxious and has insomnia.   All other systems reviewed and are negative.   Blood pressure 102/65, pulse 82, height 5\' 3"  (1.6 m), weight 191 lb (86.6 kg), SpO2 99 %.Body mass index is 33.83 kg/m.  General Appearance: Fairly Groomed  Eye Contact:  Good   Speech:  Clear and Coherent  Volume:  Normal  Mood:  Anxious and Depressed  Affect:  Appropriate, Congruent and slightly down, but less restricted  Thought Process:  Coherent  Orientation:  Full (Time, Place, and Person)  Thought Content: Logical   Suicidal Thoughts:  No  Homicidal Thoughts:  No  Memory:  Immediate;   Good  Judgement:  Good  Insight:  Fair  Psychomotor Activity:  Normal  Concentration:  Concentration: Good and Attention Span: Good  Recall:  Good  Fund of Knowledge: Good  Language: Good  Akathisia:  No  Handed:  Right  AIMS (if indicated): not done  Assets:  Communication Skills Desire for Improvement  ADL's:  Intact  Cognition: WNL  Sleep:  Fair   Screenings: PHQ2-9     Nutrition from 04/12/2016 in [facility]  PHQ-2 Total Score  1       Assessment and Plan:  [name] is a 62 y.o. year old female with a history of depression,  fibromyalgia, Chronic systolic HF/NICM, s/p ICD, who presents for follow up appointment for MDD (major depressive disorder), recurrent episode, moderate ([place]) - Plan: sertraline (ZOLOFT) 100 MG tablet  # MDD, moderate, recurrent without psychotic features There has been overall improvement in neurovegetative symptoms after uptitration of sertraline and completing IOP. Psychosocial stressors including recent break up, pain from fibromyalgia, loss of her husband several years ago. Will continue sertraline to target depression. She is on valium prn for anxiety. Discussed risk of dependence and somnolence. She will greatly benefit from CBT; referral is made.   Plan 1. Continue sertraline 150 mg daily  2. Continue valium 5 mg daily prn for anxiety (prescribed by PCP) 3. Referral to therapy  5. Return to clinic in two months for 15 mins - on Gabapentin for pruritis from shingles, pain  The patient demonstrates the following risk factors for suicide: Chronic risk factors for suicide  include: psychiatric disorder of depression. Acute risk factors for suicide include: family or marital conflict and unemployment. Protective factors for this patient include: responsibility to others (children, family), coping skills and hope for the future. Considering these factors, the overall suicide risk at this point appears to be low. Patient is appropriate for outpatient follow up. Although she does have gus at home, she adamantly denies any plan/intent and is amenable to the treatment.   The duration of this appointment visit was 30 minutes of face-to-face time with the patient.  Greater than 50% of this time was spent in counseling, explanation of  diagnosis, planning of further management, and coordination of care.  Norman Clay, MD 07/30/2017, 9:37 AM

## 2017-07-27 ENCOUNTER — Other Ambulatory Visit (HOSPITAL_COMMUNITY): Payer: Medicare HMO

## 2017-07-29 ENCOUNTER — Other Ambulatory Visit: Payer: Self-pay | Admitting: Cardiology

## 2017-07-29 DIAGNOSIS — I428 Other cardiomyopathies: Secondary | ICD-10-CM

## 2017-07-29 DIAGNOSIS — Z4502 Encounter for adjustment and management of automatic implantable cardiac defibrillator: Secondary | ICD-10-CM

## 2017-07-29 DIAGNOSIS — Z9189 Other specified personal risk factors, not elsewhere classified: Secondary | ICD-10-CM

## 2017-07-29 DIAGNOSIS — I5022 Chronic systolic (congestive) heart failure: Secondary | ICD-10-CM

## 2017-07-30 ENCOUNTER — Encounter (HOSPITAL_COMMUNITY): Payer: Self-pay | Admitting: Psychiatry

## 2017-07-30 ENCOUNTER — Ambulatory Visit (INDEPENDENT_AMBULATORY_CARE_PROVIDER_SITE_OTHER): Payer: Medicare HMO | Admitting: Psychiatry

## 2017-07-30 VITALS — BP 102/65 | HR 82 | Ht 63.0 in | Wt 191.0 lb

## 2017-07-30 DIAGNOSIS — G4700 Insomnia, unspecified: Secondary | ICD-10-CM | POA: Diagnosis not present

## 2017-07-30 DIAGNOSIS — F331 Major depressive disorder, recurrent, moderate: Secondary | ICD-10-CM

## 2017-07-30 DIAGNOSIS — R450 Nervousness: Secondary | ICD-10-CM

## 2017-07-30 DIAGNOSIS — Z87891 Personal history of nicotine dependence: Secondary | ICD-10-CM

## 2017-07-30 MED ORDER — SERTRALINE HCL 100 MG PO TABS: 150.0000 mg | ORAL_TABLET | Freq: Every day | ORAL | 0 refills | Status: DC

## 2017-07-30 NOTE — Patient Instructions (Signed)
1. Continue sertraline 150 mgdaily  2. Referral to therapy  3. Return to clinic in two months for 15 mins

## 2017-07-31 ENCOUNTER — Other Ambulatory Visit (HOSPITAL_COMMUNITY): Payer: Medicare HMO

## 2017-08-01 ENCOUNTER — Other Ambulatory Visit (HOSPITAL_COMMUNITY): Payer: Medicare HMO

## 2017-08-02 ENCOUNTER — Other Ambulatory Visit (HOSPITAL_COMMUNITY): Payer: Medicare HMO

## 2017-08-03 ENCOUNTER — Other Ambulatory Visit (HOSPITAL_COMMUNITY): Payer: Medicare HMO

## 2017-08-06 ENCOUNTER — Other Ambulatory Visit (HOSPITAL_COMMUNITY): Payer: Medicare HMO

## 2017-08-06 ENCOUNTER — Other Ambulatory Visit: Payer: Self-pay | Admitting: *Deleted

## 2017-08-06 DIAGNOSIS — Z4502 Encounter for adjustment and management of automatic implantable cardiac defibrillator: Secondary | ICD-10-CM

## 2017-08-06 DIAGNOSIS — I5022 Chronic systolic (congestive) heart failure: Secondary | ICD-10-CM

## 2017-08-06 DIAGNOSIS — I428 Other cardiomyopathies: Secondary | ICD-10-CM

## 2017-08-06 DIAGNOSIS — Z9189 Other specified personal risk factors, not elsewhere classified: Secondary | ICD-10-CM

## 2017-08-06 MED ORDER — SPIRONOLACTONE 25 MG PO TABS: 12.5000 mg | ORAL_TABLET | Freq: Every day | ORAL | 3 refills | Status: DC

## 2017-08-07 ENCOUNTER — Other Ambulatory Visit (HOSPITAL_COMMUNITY): Payer: Medicare HMO

## 2017-08-08 ENCOUNTER — Other Ambulatory Visit (HOSPITAL_COMMUNITY): Payer: Medicare HMO

## 2017-08-09 ENCOUNTER — Other Ambulatory Visit (HOSPITAL_COMMUNITY): Payer: Medicare HMO

## 2017-08-10 ENCOUNTER — Other Ambulatory Visit (HOSPITAL_COMMUNITY): Payer: Medicare HMO

## 2017-08-13 ENCOUNTER — Other Ambulatory Visit (HOSPITAL_COMMUNITY): Payer: Medicare HMO

## 2017-08-13 ENCOUNTER — Ambulatory Visit (INDEPENDENT_AMBULATORY_CARE_PROVIDER_SITE_OTHER): Payer: Medicare HMO

## 2017-08-13 DIAGNOSIS — I5022 Chronic systolic (congestive) heart failure: Secondary | ICD-10-CM

## 2017-08-13 DIAGNOSIS — Z95810 Presence of automatic (implantable) cardiac defibrillator: Secondary | ICD-10-CM | POA: Diagnosis not present

## 2017-08-14 ENCOUNTER — Telehealth: Payer: Self-pay

## 2017-08-14 ENCOUNTER — Other Ambulatory Visit (HOSPITAL_COMMUNITY): Payer: Medicare HMO

## 2017-08-14 NOTE — Progress Notes (Signed)
EPIC Encounter for ICM Monitoring  Patient Name: [name] is a 62 y.o. female Date: 08/14/2017 Primary Care Physican: Glenda Chroman, MD Primary Cardiologist:Branch Electrophysiologist:Allred Dry Weight:Previous weight 186.5lbs(baseline 180 lbs) Bi-V Pacing:98%      Attempted call to patient and unable to reach.  Left detailed message, per DPR, regarding transmission.  Transmission reviewed.    Thoracic impedance normal.   Prescribed dosage: Torsemide20 mg 1 tablet as needed.Taking differently: Torsemide 20 mg every other day alternating with 0.5 tablet every other day  Recommendations: Left voice mail with ICM number and encouraged to call if experiencing any fluid symptoms.  Follow-up plan: ICM clinic phone appointment on 10/15/2017.   Office appointment scheduled 09/14/2017 with Dr. Rayann Heman.    Copy of ICM check sent to Dr. Rayann Heman.   3 month ICM trend: 08/14/2017    1 Year ICM trend:       [name], RN 08/14/2017 12:41 PM

## 2017-08-14 NOTE — Telephone Encounter (Signed)
Remote ICM transmission received.  Attempted call to patient and left detailed message, per DPR, regarding transmission and next ICM scheduled for 10/15/2017.  Advised to return call for any fluid symptoms or questions.

## 2017-08-15 ENCOUNTER — Other Ambulatory Visit (HOSPITAL_COMMUNITY): Payer: Medicare HMO

## 2017-08-16 ENCOUNTER — Other Ambulatory Visit (HOSPITAL_COMMUNITY): Payer: Medicare HMO

## 2017-08-17 ENCOUNTER — Other Ambulatory Visit (HOSPITAL_COMMUNITY): Payer: Medicare HMO

## 2017-08-20 ENCOUNTER — Other Ambulatory Visit (HOSPITAL_COMMUNITY): Payer: Medicare HMO

## 2017-08-21 ENCOUNTER — Other Ambulatory Visit (HOSPITAL_COMMUNITY): Payer: Medicare HMO

## 2017-08-22 ENCOUNTER — Other Ambulatory Visit (HOSPITAL_COMMUNITY): Payer: Medicare HMO

## 2017-08-23 ENCOUNTER — Other Ambulatory Visit (HOSPITAL_COMMUNITY): Payer: Medicare HMO

## 2017-08-24 ENCOUNTER — Other Ambulatory Visit (HOSPITAL_COMMUNITY): Payer: Medicare HMO

## 2017-08-27 ENCOUNTER — Other Ambulatory Visit (HOSPITAL_COMMUNITY): Payer: Medicare HMO

## 2017-08-28 ENCOUNTER — Other Ambulatory Visit (HOSPITAL_COMMUNITY): Payer: Medicare HMO

## 2017-08-28 IMAGING — RF DG UGI W/ KUB
6 series · 14 of 24 positions shown · non-contrast
Comparison: None.

CLINICAL DATA: History of gastric lap band with epigastric pain and
reflux symptoms.

EXAM:
UPPER GI SERIES WITH KUB
TECHNIQUE: After obtaining a scout radiograph a routine upper GI series was
performed using thin density barium
FLUOROSCOPY TIME:  Fluoroscopy Time:  2 minutes and 36 seconds
Radiation Exposure Index (if provided by the fluoroscopic device):
265 mGy
Number of Acquired Spot Images: 0

[Series 1: one shot · 1 of 1 slices shown (1 of 2)]
[im 1/1]
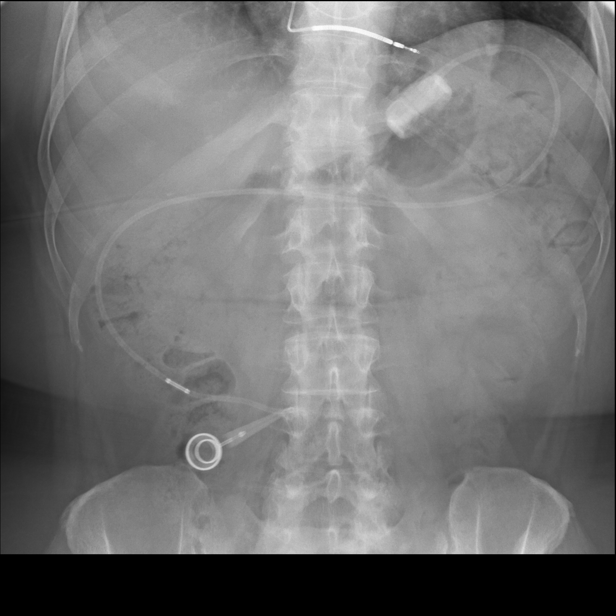

[Series 2: sequence · 2 of 23 frames shown (1 of 4)]
[frame 4/23]
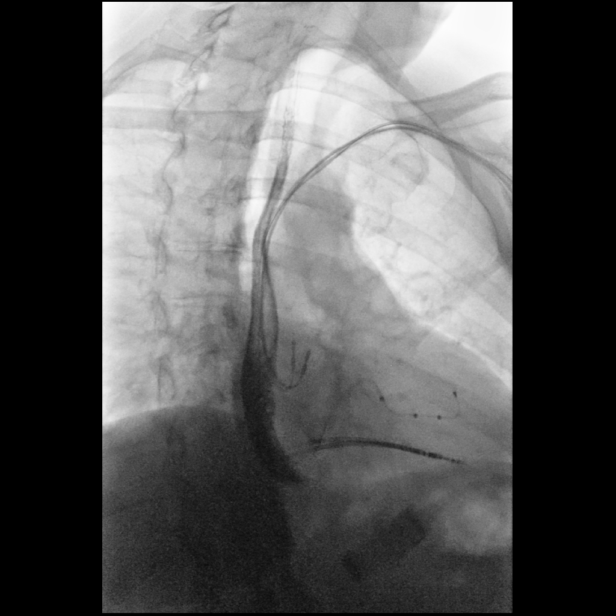
[frame 20/23]
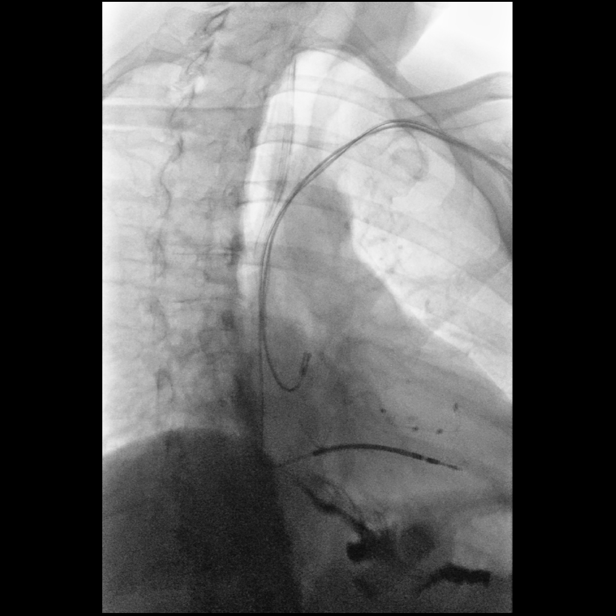

[Series 3: sequence · 1 of 2 frames shown (2 of 4)]
[frame 2/2]
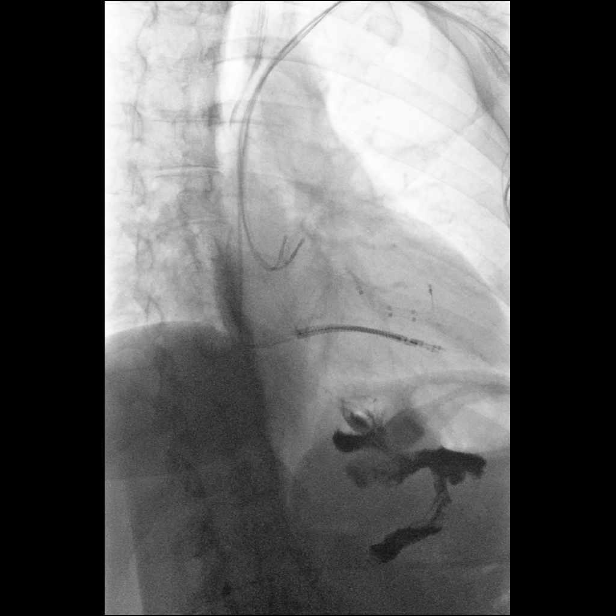

[Series 4: sequence · 2 of 27 frames shown (3 of 4)]
[frame 5/27]
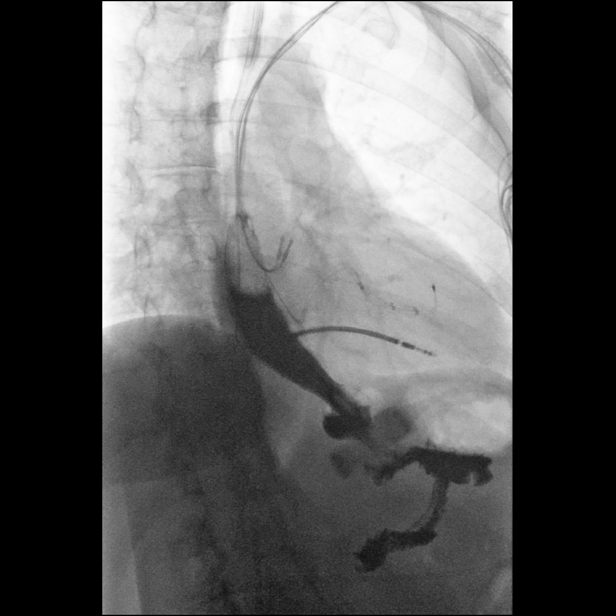
[frame 23/27]
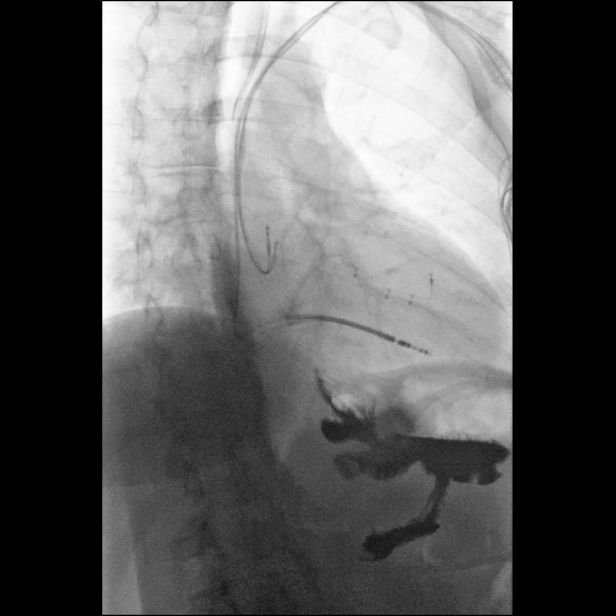

[Series 5: sequence · 2 of 24 frames shown (4 of 4)]
[frame 4/24]
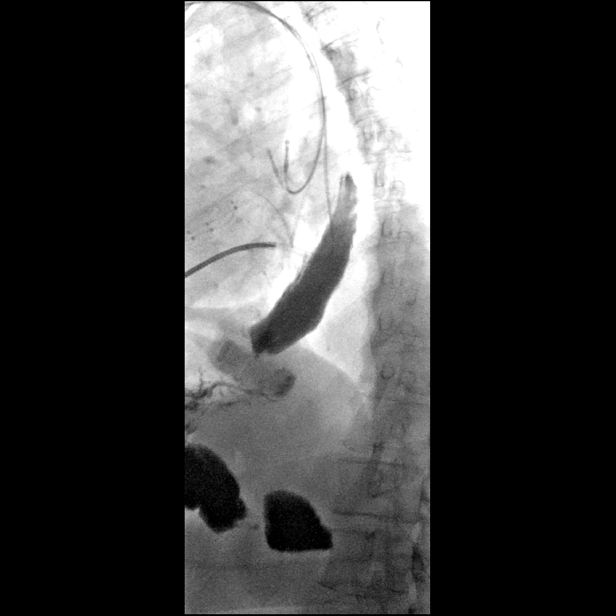
[frame 13/24]
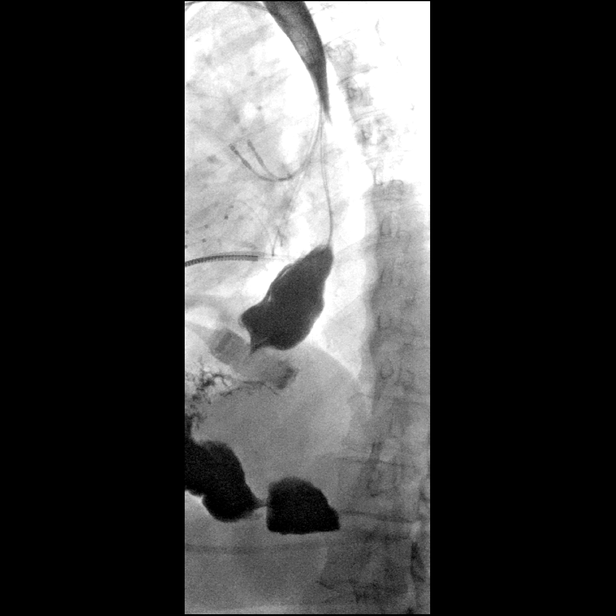

[Series 6: one shot · 6 of 10 slices shown (2 of 2)]
[im 1/10]
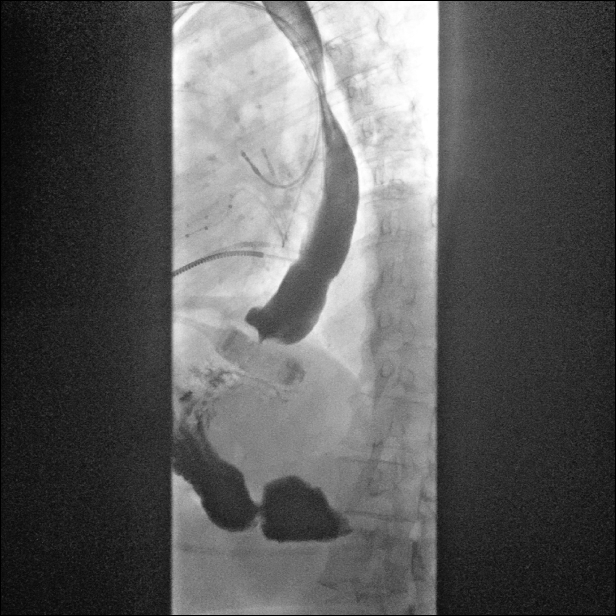
[im 3/10]
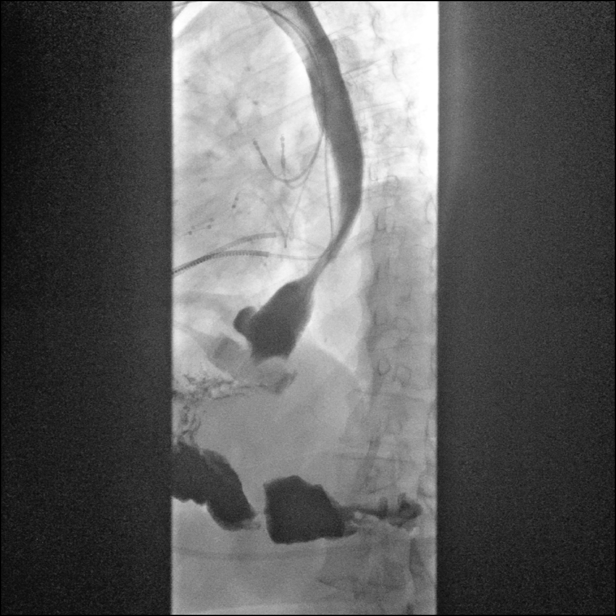
[im 5/10]
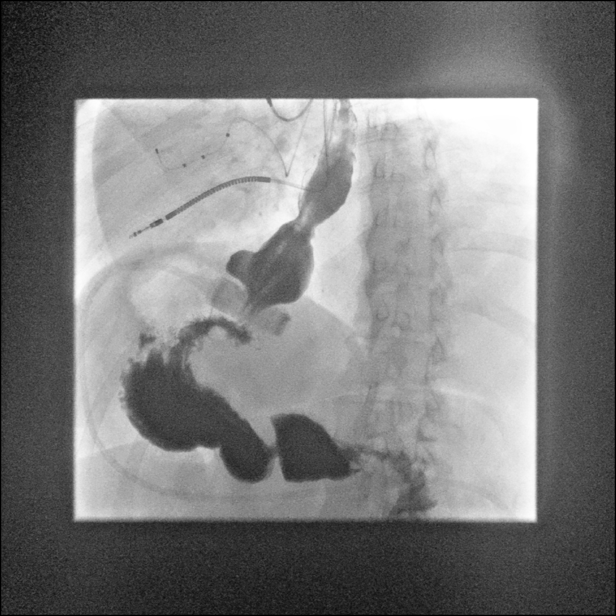
[im 6/10]
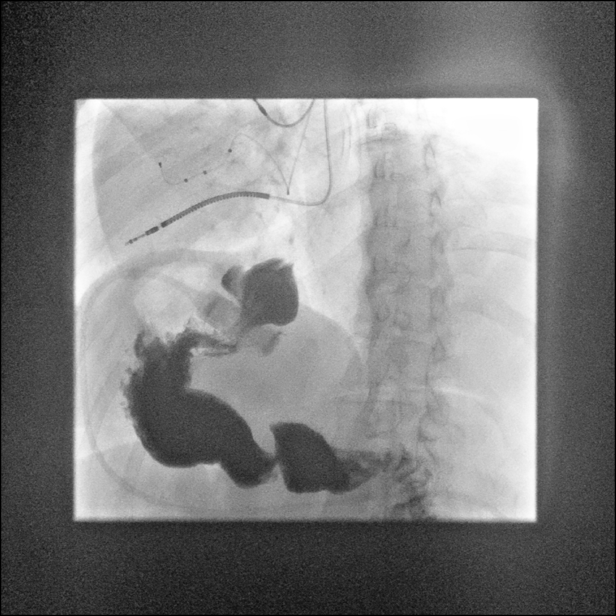
[im 8/10]
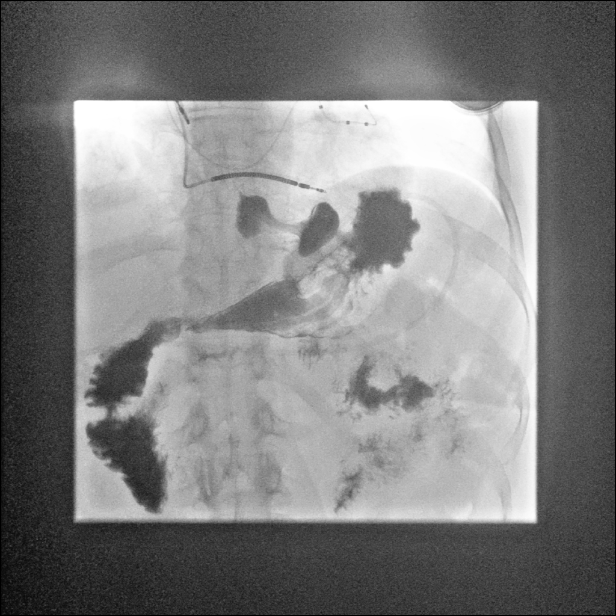
[im 10/10]
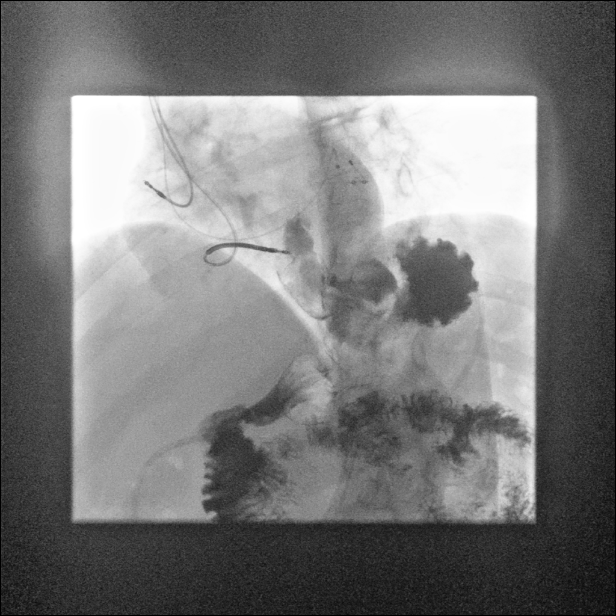

[14 of 24 positions shown; findings below may reference images not displayed]

FINDINGS: Nonspecific esophageal dysmotility with occasional disruption of the
primary peristaltic wave and occasional tertiary contractions.

Small sliding-type hiatal hernia and moderate GE reflux. No
intrinsic or extrinsic mass lesions are identified.

The gastric lap band is normally positioned in left upper quadrant
and has a normal [DATE] to [DATE] orientation. No complicating features
are demonstrated. Contrast moved through the lap band without
difficulty. The stomach is otherwise normal. The duodenum bulb and
C-loop are normal. The proximal loops of jejunum appear normal.
IMPRESSION: 1. Nonspecific esophageal dysmotility.
2. Small sliding-type hiatal hernia and moderate GE reflux.
3. Normal position and orientation of the gastric lap band. Contrast
moved through the band without difficulty.
4. Normal appearance of the stomach and duodenum.

## 2017-08-29 ENCOUNTER — Other Ambulatory Visit (HOSPITAL_COMMUNITY): Payer: Medicare HMO

## 2017-08-30 ENCOUNTER — Other Ambulatory Visit (HOSPITAL_COMMUNITY): Payer: Medicare HMO

## 2017-08-31 ENCOUNTER — Other Ambulatory Visit (HOSPITAL_COMMUNITY): Payer: Medicare HMO

## 2017-09-03 ENCOUNTER — Other Ambulatory Visit (HOSPITAL_COMMUNITY): Payer: Medicare HMO

## 2017-09-04 ENCOUNTER — Other Ambulatory Visit (HOSPITAL_COMMUNITY): Payer: Medicare HMO

## 2017-09-05 ENCOUNTER — Other Ambulatory Visit (HOSPITAL_COMMUNITY): Payer: Medicare HMO

## 2017-09-06 ENCOUNTER — Other Ambulatory Visit (HOSPITAL_COMMUNITY): Payer: Medicare HMO

## 2017-09-07 ENCOUNTER — Other Ambulatory Visit (HOSPITAL_COMMUNITY): Payer: Medicare HMO

## 2017-09-11 ENCOUNTER — Other Ambulatory Visit (HOSPITAL_COMMUNITY): Payer: Medicare HMO

## 2017-09-12 ENCOUNTER — Other Ambulatory Visit (HOSPITAL_COMMUNITY): Payer: Medicare HMO

## 2017-09-13 ENCOUNTER — Other Ambulatory Visit (HOSPITAL_COMMUNITY): Payer: Medicare HMO

## 2017-09-14 ENCOUNTER — Ambulatory Visit: Payer: Medicare HMO | Admitting: Internal Medicine

## 2017-09-14 VITALS — BP 88/62 | HR 63 | Ht 62.0 in | Wt 192.0 lb

## 2017-09-14 DIAGNOSIS — I447 Left bundle-branch block, unspecified: Secondary | ICD-10-CM

## 2017-09-14 DIAGNOSIS — R0602 Shortness of breath: Secondary | ICD-10-CM | POA: Diagnosis not present

## 2017-09-14 DIAGNOSIS — I5022 Chronic systolic (congestive) heart failure: Secondary | ICD-10-CM

## 2017-09-14 NOTE — Patient Instructions (Signed)
Your physician wants you to follow-up in: [facility] recommends that you continue on your current medications as directed. Please refer to the Current Medication list given to you today.  Your physician has requested that you have an echocardiogram. Echocardiography is a painless test that uses sound waves to create images of your heart. It provides your doctor with information about the size and shape of your heart and how well your heart's chambers and valves are working. This procedure takes approximately one hour. There are no restrictions for this procedure.  Thank you for choosing [facility]!!

## 2017-09-14 NOTE — Progress Notes (Signed)
PCP: Glenda Chroman, MD Primary Cardiologist: Dr Harl Bowie Primary EP: Dr Rayann Heman  [name] is a 62 y.o. female who presents today for routine electrophysiology followup.  Since last being seen in our clinic, the patient reports doing reasonably well.  Her fibromyalgia is her primary concern today.  + SOB with moderate activity.  Today, she denies symptoms of palpitations, chest pain,  lower extremity edema, dizziness, presyncope, syncope, or ICD shocks.  The patient is otherwise without complaint today.   Past Medical History:  Diagnosis Date  . Anxiety   . Arthritis    "joints; elbows; knees; back; neck:" (08/30/2016)  . Asthma   . Cancer of skin, face   . Chronic back pain    "mid to upper" (08/30/2016)  . Chronic bronchitis ([place])   . Chronic kidney failure, stage 2 (mild)   . Chronic systolic HF (heart failure) ([place])    a. NICM b. RHC (09/08/13): RA: 2, RV 26/0/2, PA: 25/9 (15), PCWP: 9, Fick CO/CI: 4.6 / 2.5, PA 68% c. ECHO (08/2013) EF 20-25%, grade II DD, RV sys fx mildly reduced, mod TR  . COPD (chronic obstructive pulmonary disease) ([place])   . Depression   . Fibromyalgia   . GERD (gastroesophageal reflux disease)   . Hypothyroidism   . ICD (implantable cardioverter-defibrillator), dual, in situ 01/12/2014   St. Jude ; for primary prevention for EF<35% by Dr Caryl Comes  . Left bundle branch block   . NICM (nonischemic cardiomyopathy) ([place])    a. LHC (08/2013): no angiographic evidence of CAD  . OSA on CPAP   . Pneumonia    "several times" (08/30/2016)   Past Surgical History:  Procedure Laterality Date  . BI-VENTRICULAR IMPLANTABLE CARDIOVERTER DEFIBRILLATOR N/A 01/12/2014   SJM Quadra Assura CRT-D biv ICD implanted by Dr Caryl Comes for primary prevention of sudden death  . CARDIAC CATHETERIZATION    . CHOLECYSTECTOMY N/A 08/30/2016   Procedure: LAPAROSCOPIC CHOLECYSTECTOMY WITH INTRAOPERATIVE CHOLANGIOGRAM;  Surgeon: Fanny Skates, MD;  Location: [facility];  Service: General;   Laterality: N/A;  . FRACTURE SURGERY    . HUMERUS FRACTURE SURGERY Left    "fell after I had my RCR"  . LAPAROSCOPIC CHOLECYSTECTOMY  08/30/2016   w/IOC  . LAPAROSCOPIC GASTRIC BANDING    . LEFT AND RIGHT HEART CATHETERIZATION WITH CORONARY ANGIOGRAM N/A 09/08/2013   Procedure: LEFT AND RIGHT HEART CATHETERIZATION WITH CORONARY ANGIOGRAM;  Surgeon: Burnell Blanks, MD;  Location: [facility] CATH LAB;  Service: Cardiovascular;  Laterality: N/A;  . SHOULDER OPEN ROTATOR CUFF REPAIR Left   . TONSILLECTOMY    . TUBAL LIGATION    . VAGINAL HYSTERECTOMY      ROS- all systems are reviewed and negative except as per HPI above  Current Outpatient Medications  Medication Sig Dispense Refill  . acetaminophen (TYLENOL) 500 MG tablet Take 1,000 mg by mouth 2 (two) times daily as needed for moderate pain or headache.    Marland Kitchen aspirin EC 81 MG EC tablet Take 1 tablet (81 mg total) by mouth daily.    Marland Kitchen atorvastatin (LIPITOR) 20 MG tablet Take 20 mg by mouth at bedtime.    . cetirizine (ZYRTEC) 10 MG tablet Take 10 mg by mouth at bedtime.     . diazepam (VALIUM) 5 MG tablet Take 2.5-5 mg by mouth 2 (two) times daily as needed for anxiety.     . diclofenac sodium (VOLTAREN) 1 % GEL Apply 1 application topically 3 (three) times daily as needed (fibromyalgia).     Marland Kitchen  docusate sodium (COLACE) 100 MG capsule Take 200 mg by mouth at bedtime as needed for mild constipation.    Marland Kitchen EPINEPHrine (EPIPEN 2-PAK) 0.3 mg/0.3 mL IJ SOAJ injection Inject 0.3 mLs (0.3 mg total) into the muscle once as needed (for severe allergic reaction). CAll 911 immediately if you have to use this medicine 1 Device 1  . gabapentin (NEURONTIN) 300 MG capsule Take 600 mg by mouth 3 (three) times daily.     Marland Kitchen HYDROcodone-acetaminophen (NORCO) 10-325 MG tablet Take 1 tablet by mouth every 6 (six) hours as needed.    . hydrocortisone cream 1 % Apply 1 application topically 2 (two) times daily as needed for itching.    . hydrOXYzine  (ATARAX/VISTARIL) 10 MG tablet Take 1 tablet (10 mg total) by mouth every 6 (six) hours as needed for itching. 20 tablet 0  . levothyroxine (SYNTHROID, LEVOTHROID) 112 MCG tablet Take 112 mcg by mouth daily before breakfast.    . loperamide (IMODIUM) 2 MG capsule Take 1 capsule (2 mg total) by mouth 4 (four) times daily as needed for diarrhea or loose stools. 30 capsule 0  . losartan (COZAAR) 25 MG tablet TAKE 1/2 TABLET BY MOUTH DAILY 45 tablet 4  . metoprolol succinate (TOPROL-XL) 25 MG 24 hr tablet Take 0.5 tablets (12.5 mg total) by mouth daily. 45 tablet 3  . Multiple Vitamin (MULTIVITAMIN WITH MINERALS) TABS tablet Take 1 tablet by mouth daily.    . Multiple Vitamins-Minerals (WOMENS MULTIVITAMIN) TABS Take 1 tablet by mouth daily.    Marland Kitchen OVER THE COUNTER MEDICATION Take by mouth 2 (two) times daily. AND RED :  VITAMIN: MACULUAR DEGENERATION    . polyethylene glycol (MIRALAX / GLYCOLAX) packet Take 17 g by mouth daily as needed for mild constipation.     . Probiotic CAPS Take 1 capsule by mouth daily.    . promethazine (PHENERGAN) 25 MG tablet Take 25 mg by mouth daily as needed for nausea or vomiting.     . ranitidine (ZANTAC) 150 MG tablet Take 150 mg by mouth 2 (two) times daily.     . sertraline (ZOLOFT) 100 MG tablet Take 1.5 tablets (150 mg total) by mouth daily. 135 tablet 0  . spironolactone (ALDACTONE) 25 MG tablet Take 0.5 tablets (12.5 mg total) by mouth at bedtime. 45 tablet 3  . tiZANidine (ZANAFLEX) 4 MG tablet Take 2-4 mg by mouth every 8 (eight) hours as needed for muscle spasms (for fibromyalgia).     . torsemide (DEMADEX) 20 MG tablet Take 1 tablet (20 mg total) by mouth daily as needed (FOR SWELLING). 30 tablet 6  . TURMERIC PO Take 100 mg by mouth daily.     No current facility-administered medications for this visit.     Physical Exam: Vitals:   09/14/17 0944  BP: (!) 88/62  Pulse: 63  SpO2: 97%  Weight: 192 lb (87.1 kg)  Height: 5\' 2"  (1.575 m)    GEN- The  patient is well appearing, alert and oriented x 3 today.   Head- normocephalic, atraumatic Eyes-  Sclera clear, conjunctiva pink Ears- hearing intact Oropharynx- clear Lungs- Clear to ausculation bilaterally, normal work of breathing Chest- ICD pocket is well healed Heart- Regular rate and rhythm, no murmurs, rubs or gallops, PMI not laterally displaced GI- soft, NT, ND, + BS Extremities- no clubbing, cyanosis, or edema  ICD interrogation- reviewed in detail today,  See PACEART report    Wt Readings from Last 3 Encounters:  09/14/17 192 lb (87.1  kg)  03/07/17 192 lb 9.6 oz (87.4 kg)  09/15/16 184 lb (83.5 kg)    Assessment and Plan:  1.  Chronic systolic dysfunction/ nonischemic CM/ LBBB euvolemic today EF normalized with CRT previously Given SOB, will update echo at this time.  I did offer CRT optimization in our optimization clinic today.  She declines at this time but may reconsider if her EF has worsened or if her symptoms progress Stable on an appropriate medical regimen Normal ICD function See Pace Art report No changes today followed in ICM device clinic  Merlin Return in a year   Thompson Grayer MD, [facility] 09/14/2017 10:08 AM

## 2017-09-16 LAB — CUP PACEART INCLINIC DEVICE CHECK
Brady Statistic RV Percent Paced: 99 %
HighPow Impedance: 77.625
Implantable Lead Implant Date: 20160104
Implantable Lead Implant Date: 20160104
Implantable Lead Location: 753858
Implantable Lead Location: 753859
Implantable Lead Location: 753860
Lead Channel Impedance Value: 462.5 Ohm
Lead Channel Pacing Threshold Amplitude: 0.75 V
Lead Channel Pacing Threshold Amplitude: 0.75 V
Lead Channel Pacing Threshold Amplitude: 0.75 V
Lead Channel Pacing Threshold Amplitude: 1 V
Lead Channel Pacing Threshold Pulse Width: 0.5 ms
Lead Channel Pacing Threshold Pulse Width: 0.5 ms
Lead Channel Pacing Threshold Pulse Width: 0.5 ms
Lead Channel Pacing Threshold Pulse Width: 0.5 ms
Lead Channel Pacing Threshold Pulse Width: 0.5 ms
Lead Channel Pacing Threshold Pulse Width: 0.5 ms
Lead Channel Sensing Intrinsic Amplitude: 1.2 mV
Lead Channel Sensing Intrinsic Amplitude: 11 mV
Lead Channel Setting Pacing Amplitude: 1.5 V
Lead Channel Setting Pacing Pulse Width: 0.5 ms
Lead Channel Setting Pacing Pulse Width: 0.5 ms
Lead Channel Setting Sensing Sensitivity: 0.5 mV
MDC IDC LEAD IMPLANT DT: 20160104
MDC IDC MSMT BATTERY REMAINING LONGEVITY: 45 mo
MDC IDC MSMT LEADCHNL LV IMPEDANCE VALUE: 1050 Ohm
MDC IDC MSMT LEADCHNL LV PACING THRESHOLD AMPLITUDE: 0.75 V
MDC IDC MSMT LEADCHNL RV IMPEDANCE VALUE: 400 Ohm
MDC IDC MSMT LEADCHNL RV PACING THRESHOLD AMPLITUDE: 1 V
MDC IDC PG IMPLANT DT: 20160104
MDC IDC PG SERIAL: 7222096
MDC IDC SESS DTM: 20190906134745
MDC IDC SET LEADCHNL LV PACING AMPLITUDE: 2 V
MDC IDC SET LEADCHNL RV PACING AMPLITUDE: 2.5 V
MDC IDC STAT BRADY RA PERCENT PACED: 38 %

## 2017-09-17 ENCOUNTER — Other Ambulatory Visit (HOSPITAL_COMMUNITY): Payer: Medicare HMO

## 2017-09-18 ENCOUNTER — Other Ambulatory Visit (HOSPITAL_COMMUNITY): Payer: Medicare HMO

## 2017-09-19 ENCOUNTER — Other Ambulatory Visit (HOSPITAL_COMMUNITY): Payer: Medicare HMO

## 2017-09-20 ENCOUNTER — Other Ambulatory Visit (HOSPITAL_COMMUNITY): Payer: Medicare HMO

## 2017-09-21 ENCOUNTER — Other Ambulatory Visit (HOSPITAL_COMMUNITY): Payer: Medicare HMO

## 2017-09-21 NOTE — Progress Notes (Signed)
[facility] MD/PA/NP OP Progress Note  10/01/2017 9:50 AM [name]  MRN:  [id number]  Chief Complaint:  Chief Complaint    Follow-up; Depression     HPI:  Patient presents for follow-up appointment for depression.  She states that she feels "even out" since the lat appointment. 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  She feels good to help other people.  Although she still thinks about her ex-boyfriend, she denies ruminating on those.  She tries not to feel sorry for herself.  She also try to have distance from her ex-boyfriend's sister, who she used to be close with.  She talks about her son, who has Charcot neuropathy.  He gave his wife the option of leave him due to his medical condition.  She states that it is hard for the patient to see him having this medical condition.  She has occasional insomnia.  She feels less depressed. She has fair appetite. She has more motivation.  She denies SI.  She denies anxiety or panic attacks.  She has not taken any Valium since the last appointment.  She works on yard and may take some walk at times.    Wt Readings from Last 3 Encounters:  10/01/17 189 lb (85.7 kg)  09/14/17 192 lb (87.1 kg)  07/30/17 191 lb (86.6 kg)    Visit Diagnosis:    ICD-10-CM   1. MDD (major depressive disorder), recurrent episode, moderate (HCC) F33.1 sertraline (ZOLOFT) 100 MG tablet    Past Psychiatric History: Please see initial evaluation for full details. I have reviewed the history. No updates at this time.     Past Medical History:  Past Medical History:  Diagnosis Date  . Anxiety   . Arthritis    "joints; elbows; knees; back; neck:" (08/30/2016)  . Asthma   . Cancer of skin, face   . Chronic back pain    "mid to upper" (08/30/2016)  . Chronic bronchitis ([place])   . Chronic kidney failure, stage 2 (mild)   . Chronic systolic HF (heart failure) ([place])    a. NICM b. RHC (09/08/13): RA: 2, RV 26/0/2, PA: 25/9  (15), PCWP: 9, Fick CO/CI: 4.6 / 2.5, PA 68% c. ECHO (08/2013) EF 20-25%, grade II DD, RV sys fx mildly reduced, mod TR  . COPD (chronic obstructive pulmonary disease) ([place])   . Depression   . Fibromyalgia   . GERD (gastroesophageal reflux disease)   . Hypothyroidism   . ICD (implantable cardioverter-defibrillator), dual, in situ 01/12/2014   St. Jude ; for primary prevention for EF<35% by Dr Caryl Comes  . Left bundle branch block   . NICM (nonischemic cardiomyopathy) ([place])    a. LHC (08/2013): no angiographic evidence of CAD  . OSA on CPAP   . Pneumonia    "several times" (08/30/2016)    Past Surgical History:  Procedure Laterality Date  . BI-VENTRICULAR IMPLANTABLE CARDIOVERTER DEFIBRILLATOR N/A 01/12/2014   SJM Quadra Assura CRT-D biv ICD implanted by Dr Caryl Comes for primary prevention of sudden death  . CARDIAC CATHETERIZATION    . CHOLECYSTECTOMY N/A 08/30/2016   Procedure: LAPAROSCOPIC CHOLECYSTECTOMY WITH INTRAOPERATIVE CHOLANGIOGRAM;  Surgeon: Fanny Skates, MD;  Location: [facility];  Service: General;  Laterality: N/A;  . FRACTURE SURGERY    . HUMERUS FRACTURE SURGERY Left    "fell after I had my RCR"  . LAPAROSCOPIC CHOLECYSTECTOMY  08/30/2016   w/IOC  . LAPAROSCOPIC GASTRIC BANDING    .  LEFT AND RIGHT HEART CATHETERIZATION WITH CORONARY ANGIOGRAM N/A 09/08/2013   Procedure: LEFT AND RIGHT HEART CATHETERIZATION WITH CORONARY ANGIOGRAM;  Surgeon: Burnell Blanks, MD;  Location: [facility] CATH LAB;  Service: Cardiovascular;  Laterality: N/A;  . SHOULDER OPEN ROTATOR CUFF REPAIR Left   . TONSILLECTOMY    . TUBAL LIGATION    . VAGINAL HYSTERECTOMY      Family Psychiatric History: Please see initial evaluation for full details. I have reviewed the history. No updates at this time.     Family History:  Family History  Problem Relation Age of Onset  . Hypertension Mother        deceased: adenocarcinoma, HLD  . Cancer Father        deceased: basal cell carcinoma  . Alcohol abuse  Father   . Cancer Sister        breast  . Cancer Brother        skin  . Hyperlipidemia Brother     Social History:  Social History   Socioeconomic History  . Marital status: Widowed    Spouse name: Not on file  . Number of children: Not on file  . Years of education: Not on file  . Highest education level: Not on file  Occupational History  . Not on file  Social Needs  . Financial resource strain: Not on file  . Food insecurity:    Worry: Not on file    Inability: Not on file  . Transportation needs:    Medical: Not on file    Non-medical: Not on file  Tobacco Use  . Smoking status: Former Smoker    Packs/day: 1.00    Years: 16.00    Pack years: 16.00    Types: Cigarettes    Start date: 04/22/1972    Last attempt to quit: 09/25/1988    Years since quitting: 29.0  . Smokeless tobacco: Never Used  Substance and Sexual Activity  . Alcohol use: Yes    Alcohol/week: 0.0 standard drinks    Comment: 08/30/2016 "mixed drink q 2-3 months"  . Drug use: No  . Sexual activity: Not Currently  Lifestyle  . Physical activity:    Days per week: Not on file    Minutes per session: Not on file  . Stress: Not on file  Relationships  . Social connections:    Talks on phone: Not on file    Gets together: Not on file    Attends religious service: Not on file    Active member of club or organization: Not on file    Attends meetings of clubs or organizations: Not on file    Relationship status: Not on file  Other Topics Concern  . Not on file  Social History Narrative   Lives in [place] by herself and daughter lives next door. Retired [place]    Allergies:  Allergies  Allergen Reactions  . Nexium [Esomeprazole Magnesium] Swelling and Rash  . Nucynta [Tapentadol] Itching  . Oxycodone Itching    Metabolic Disorder Labs: No results found for: HGBA1C, MPG No results found for: PROLACTIN Lab Results  Component Value Date   CHOL 226 (H) 01/18/2014   TRIG 202 (H) 01/18/2014   HDL  62 01/18/2014   CHOLHDL 3.6 01/18/2014   VLDL 40 01/18/2014   LDLCALC 124 (H) 01/18/2014   Lab Results  Component Value Date   TSH 0.841 06/20/2016   TSH 0.085 (L) 09/11/2014    Therapeutic Level Labs: No results found for: LITHIUM  No results found for: VALPROATE No components found for:  CBMZ  Current Medications: Current Outpatient Medications  Medication Sig Dispense Refill  . acetaminophen (TYLENOL) 500 MG tablet Take 1,000 mg by mouth 2 (two) times daily as needed for moderate pain or headache.    Marland Kitchen aspirin EC 81 MG EC tablet Take 1 tablet (81 mg total) by mouth daily.    Marland Kitchen atorvastatin (LIPITOR) 20 MG tablet Take 20 mg by mouth at bedtime.    . cetirizine (ZYRTEC) 10 MG tablet Take 10 mg by mouth at bedtime.     . diazepam (VALIUM) 5 MG tablet Take 2.5-5 mg by mouth 2 (two) times daily as needed for anxiety.     . diclofenac sodium (VOLTAREN) 1 % GEL Apply 1 application topically 3 (three) times daily as needed (fibromyalgia).     Marland Kitchen docusate sodium (COLACE) 100 MG capsule Take 200 mg by mouth at bedtime as needed for mild constipation.    Marland Kitchen EPINEPHrine (EPIPEN 2-PAK) 0.3 mg/0.3 mL IJ SOAJ injection Inject 0.3 mLs (0.3 mg total) into the muscle once as needed (for severe allergic reaction). CAll 911 immediately if you have to use this medicine 1 Device 1  . gabapentin (NEURONTIN) 300 MG capsule Take 600 mg by mouth 3 (three) times daily.     Marland Kitchen HYDROcodone-acetaminophen (NORCO) 10-325 MG tablet Take 1 tablet by mouth every 6 (six) hours as needed.    . hydrocortisone cream 1 % Apply 1 application topically 2 (two) times daily as needed for itching.    . hydrOXYzine (ATARAX/VISTARIL) 10 MG tablet Take 1 tablet (10 mg total) by mouth every 6 (six) hours as needed for itching. 20 tablet 0  . levothyroxine (SYNTHROID, LEVOTHROID) 112 MCG tablet Take 112 mcg by mouth daily before breakfast.    . loperamide (IMODIUM) 2 MG capsule Take 1 capsule (2 mg total) by mouth 4 (four) times daily  as needed for diarrhea or loose stools. 30 capsule 0  . losartan (COZAAR) 25 MG tablet TAKE 1/2 TABLET BY MOUTH DAILY 45 tablet 4  . metoprolol succinate (TOPROL-XL) 25 MG 24 hr tablet Take 0.5 tablets (12.5 mg total) by mouth daily. 45 tablet 3  . Multiple Vitamin (MULTIVITAMIN WITH MINERALS) TABS tablet Take 1 tablet by mouth daily.    . Multiple Vitamins-Minerals (WOMENS MULTIVITAMIN) TABS Take 1 tablet by mouth daily.    Marland Kitchen OVER THE COUNTER MEDICATION Take by mouth 2 (two) times daily. AND RED :  VITAMIN: MACULUAR DEGENERATION    . polyethylene glycol (MIRALAX / GLYCOLAX) packet Take 17 g by mouth daily as needed for mild constipation.     . Probiotic CAPS Take 1 capsule by mouth daily.    . promethazine (PHENERGAN) 25 MG tablet Take 25 mg by mouth daily as needed for nausea or vomiting.     . ranitidine (ZANTAC) 150 MG tablet Take 150 mg by mouth 2 (two) times daily.     . sertraline (ZOLOFT) 100 MG tablet Take 1.5 tablets (150 mg total) by mouth daily. 135 tablet 0  . spironolactone (ALDACTONE) 25 MG tablet Take 0.5 tablets (12.5 mg total) by mouth at bedtime. 45 tablet 3  . tiZANidine (ZANAFLEX) 4 MG tablet Take 2-4 mg by mouth every 8 (eight) hours as needed for muscle spasms (for fibromyalgia).     . torsemide (DEMADEX) 20 MG tablet Take 1 tablet (20 mg total) by mouth daily as needed (FOR SWELLING). 30 tablet 6  . TURMERIC PO Take 100  mg by mouth daily.     No current facility-administered medications for this visit.      Musculoskeletal: Strength & Muscle Tone: within normal limits Gait & Station: normal Patient leans: N/A  Psychiatric Specialty Exam: Review of Systems  Psychiatric/Behavioral: Positive for depression. Negative for hallucinations, memory loss, substance abuse and suicidal ideas. The patient is nervous/anxious and has insomnia.   All other systems reviewed and are negative.   Blood pressure 106/74, pulse 74, height 5\' 2"  (1.575 m), weight 189 lb (85.7 kg), SpO2  96 %.Body mass index is 34.57 kg/m.  General Appearance: Fairly Groomed  Eye Contact:  Good  Speech:  Clear and Coherent  Volume:  Normal  Mood:  Depressed  Affect:  Appropriate and Congruent  Thought Process:  Coherent  Orientation:  Full (Time, Place, and Person)  Thought Content: Logical   Suicidal Thoughts:  No  Homicidal Thoughts:  No  Memory:  Immediate;   Good  Judgement:  Good  Insight:  Fair  Psychomotor Activity:  Normal  Concentration:  Concentration: Good and Attention Span: Good  Recall:  Good  Fund of Knowledge: Good  Language: Good  Akathisia:  No  Handed:  Right  AIMS (if indicated): not done  Assets:  Communication Skills Desire for Improvement  ADL's:  Intact  Cognition: WNL  Sleep:  Fair   Screenings: PHQ2-9     Nutrition from 04/12/2016 in [facility]  PHQ-2 Total Score  1       Assessment and Plan:  DARI CARPENITO is a 62 y.o. year old female with a history of depression,  fibromyalgia,Chronic systolic HF/NICM, s/p ICD, who presents for follow up appointment for MDD (major depressive disorder), recurrent episode, moderate ([place]) - Plan: sertraline (ZOLOFT) 100 MG tablet  # MDD, moderate, recurrent without psychotic features Has been overall improvement in neurovegetative symptoms after up titration of sertraline and completing IOP.  Psychosocial stressors including recent break-up, pain from fibromyalgia, loss of her husband several years ago, and her son with medical disease.  Will continue sertraline to target depression.  She is on Valium as needed for anxiety, prescribed by PCP.  Discussed risk of dependence and somnolence.  Discussed behavioral activation.   Plan I have reviewed and updated plans as below 1. Continue sertraline 150 mg daily  2. Continue valium 5 mg daily prn for anxiety  3. Return to clinic in three months for 15 mins - on Gabapentin for pruritis from shingles, pain  The patient  demonstrates the following risk factors for suicide: Chronic risk factors for suicide include:psychiatric disorder ofdepression. Acute risk factorsfor suicide include: family or marital conflict and unemployment. Protective factorsfor this patient include: responsibility to others (children, family), coping skills and hope for the future. Considering these factors, the overall suicide risk at this point appears to below. Patientisappropriate for outpatient follow up. Although she does have gus at home, she adamantly denies any plan/intent and is amenable to the treatment.  The duration of this appointment visit was 30 minutes of face-to-face time with the patient.  Greater than 50% of this time was spent in counseling, explanation of  diagnosis, planning of further management, and coordination of care.  Norman Clay, MD 10/01/2017, 9:50 AM

## 2017-09-24 ENCOUNTER — Other Ambulatory Visit (HOSPITAL_COMMUNITY): Payer: Medicare HMO

## 2017-09-25 ENCOUNTER — Other Ambulatory Visit (HOSPITAL_COMMUNITY): Payer: Medicare HMO

## 2017-09-26 ENCOUNTER — Encounter (HOSPITAL_COMMUNITY): Payer: Self-pay | Admitting: Licensed Clinical Social Worker

## 2017-09-26 ENCOUNTER — Other Ambulatory Visit (HOSPITAL_COMMUNITY): Payer: Medicare HMO

## 2017-09-26 ENCOUNTER — Ambulatory Visit (INDEPENDENT_AMBULATORY_CARE_PROVIDER_SITE_OTHER): Payer: Medicare HMO | Admitting: Licensed Clinical Social Worker

## 2017-09-26 DIAGNOSIS — F331 Major depressive disorder, recurrent, moderate: Secondary | ICD-10-CM | POA: Diagnosis not present
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
## 2017-09-27 ENCOUNTER — Other Ambulatory Visit (HOSPITAL_COMMUNITY): Payer: Medicare HMO

## 2017-09-28 ENCOUNTER — Other Ambulatory Visit (HOSPITAL_COMMUNITY): Payer: Medicare HMO

## 2017-10-01 ENCOUNTER — Ambulatory Visit (INDEPENDENT_AMBULATORY_CARE_PROVIDER_SITE_OTHER): Payer: Medicare HMO | Admitting: Psychiatry

## 2017-10-01 ENCOUNTER — Encounter (HOSPITAL_COMMUNITY): Payer: Self-pay | Admitting: Psychiatry

## 2017-10-01 VITALS — BP 106/74 | HR 74 | Ht 62.0 in | Wt 189.0 lb

## 2017-10-01 DIAGNOSIS — F331 Major depressive disorder, recurrent, moderate: Secondary | ICD-10-CM | POA: Diagnosis not present

## 2017-10-01 DIAGNOSIS — Z87891 Personal history of nicotine dependence: Secondary | ICD-10-CM

## 2017-10-01 DIAGNOSIS — Z811 Family history of alcohol abuse and dependence: Secondary | ICD-10-CM | POA: Diagnosis not present

## 2017-10-01 DIAGNOSIS — F419 Anxiety disorder, unspecified: Secondary | ICD-10-CM | POA: Diagnosis not present

## 2017-10-01 DIAGNOSIS — G4700 Insomnia, unspecified: Secondary | ICD-10-CM

## 2017-10-01 MED ORDER — SERTRALINE HCL 100 MG PO TABS: 150.0000 mg | ORAL_TABLET | Freq: Every day | ORAL | 0 refills | Status: DC

## 2017-10-01 NOTE — Patient Instructions (Signed)
1. Continue sertraline 150 mg daily  2. Continue valium 5 mg daily prn for anxiety  3. Return to clinic in three months for 15 mins

## 2017-10-03 ENCOUNTER — Ambulatory Visit (INDEPENDENT_AMBULATORY_CARE_PROVIDER_SITE_OTHER): Payer: Medicare HMO

## 2017-10-03 ENCOUNTER — Other Ambulatory Visit: Payer: Self-pay

## 2017-10-03 DIAGNOSIS — I5022 Chronic systolic (congestive) heart failure: Secondary | ICD-10-CM | POA: Diagnosis not present

## 2017-10-15 ENCOUNTER — Ambulatory Visit (INDEPENDENT_AMBULATORY_CARE_PROVIDER_SITE_OTHER): Payer: Medicare HMO | Admitting: *Deleted

## 2017-10-15 ENCOUNTER — Ambulatory Visit (INDEPENDENT_AMBULATORY_CARE_PROVIDER_SITE_OTHER): Payer: Medicare HMO

## 2017-10-15 DIAGNOSIS — Z95810 Presence of automatic (implantable) cardiac defibrillator: Secondary | ICD-10-CM | POA: Diagnosis not present

## 2017-10-15 DIAGNOSIS — I428 Other cardiomyopathies: Secondary | ICD-10-CM | POA: Diagnosis not present

## 2017-10-15 DIAGNOSIS — I5022 Chronic systolic (congestive) heart failure: Secondary | ICD-10-CM | POA: Diagnosis not present

## 2017-10-15 NOTE — Progress Notes (Signed)
Remote ICD transmission.   

## 2017-10-16 NOTE — Progress Notes (Signed)
EPIC Encounter for ICM Monitoring  Patient Name: PARUL PORCELLI is a 62 y.o. female Date: 10/16/2017 Primary Care Physican: Glenda Chroman, MD Primary Cardiologist:Branch Electrophysiologist:Allred Dry Weight:185lbs Bi-V Pacing:99%                                                  Spoke with patient.  Heart failure questions reviewed.  She is asymptomatic and feeling well at this time.     Thoracic impedance normal.   Prescribed dosage: Torsemide20 mg 1 tablet as needed.Takes Torsemide about once a month.  Recommendations: Left voice mail with ICM number and encouraged to call if experiencing any fluid symptoms.  Follow-up plan: ICM clinic phone appointment on 11/16/2017.       Copy of ICM check sent to Dr. Rayann Heman.   3 month ICM trend: 10/15/2017    1 Year ICM trend:       [name], RN 10/16/2017 7:54 AM

## 2017-10-24 ENCOUNTER — Encounter: Payer: Self-pay | Admitting: Cardiology

## 2017-11-16 ENCOUNTER — Ambulatory Visit (INDEPENDENT_AMBULATORY_CARE_PROVIDER_SITE_OTHER): Payer: Medicare HMO

## 2017-11-16 DIAGNOSIS — Z95810 Presence of automatic (implantable) cardiac defibrillator: Secondary | ICD-10-CM | POA: Diagnosis not present

## 2017-11-16 DIAGNOSIS — I5022 Chronic systolic (congestive) heart failure: Secondary | ICD-10-CM

## 2017-11-16 NOTE — Progress Notes (Signed)
EPIC Encounter for ICM Monitoring  Patient Name: [name] is a 62 y.o. female Date: 11/16/2017 Primary Care Physican: Glenda Chroman, MD Primary Cardiologist:Branch Electrophysiologist:Allred Bi-V Pacing:99% Last Weight:  185lbs  Today's Weight: 192 lbs       Heart Failure questions reviewed, pt asymptomatic with weight gain of about 5-6 pounds and swelling in the feet. She has been in [place] for a vacation and noticed the swelling when she returned home.    Thoracic impedance abnormal suggesting fluid accumulation starting 11/03/2017.   Prescribed: Torsemide20 mg 1 tablet as needed.  Recommendations:  Advised to take PRN Torsemide 20 mg 1 tablet x 3 days.    Follow-up plan: ICM clinic phone appointment on 11/26/2017 to recheck fluid levels.      Copy of ICM check sent to Dr. Rayann Heman and Dr Harl Bowie.   3 month ICM trend: 11/16/2017    1 Year ICM trend:       [name], RN 11/16/2017 5:05 PM

## 2017-11-19 ENCOUNTER — Encounter (HOSPITAL_COMMUNITY): Payer: Self-pay | Admitting: Licensed Clinical Social Worker

## 2017-11-19 ENCOUNTER — Ambulatory Visit (INDEPENDENT_AMBULATORY_CARE_PROVIDER_SITE_OTHER): Payer: Medicare HMO | Admitting: Licensed Clinical Social Worker

## 2017-11-19 DIAGNOSIS — F331 Major depressive disorder, recurrent, moderate: Secondary | ICD-10-CM

## 2017-11-19 NOTE — Progress Notes (Signed)
   THERAPIST PROGRESS NOTE  Session Time: 8:00 am-9:00 am  Participation Level: Active  Behavioral Response: CasualAlertDepressed  Type of Therapy: Individual Therapy  Treatment Goals addressed: Coping  Interventions: CBT and Solution Focused  Summary: [name] is a 62 y.o. female who presents oriented x5 (person, place, situation, time, and object), casually dressed, appropriately groomed, average height, average weight, and cooperative to address mood. Patient has a history of medical treatment including COPD, kidney failure, Fibromyalgia, and heart conditions. Patient has a history of mental health treatment. Patient denies suicidal and homicidal ideations. Patient denies psychosis including auditory and visual hallucinations. Patient denies substance abuse. Patient is at low risk for lethality at this time.  Physically: Patient is in constant pain. She is struggling with her fibromyalgia. Her sleep is good. Patient's appetite is ok, she eats when she is hungry.  Spiritually/values:  Patient is doing ok spiritually. She doesn't attend church much but is doing well. She beats herself up with guilt over things that she has done in the past. She has asked God for forgiveness for what she has done.  Relationships: No issues identified.  Emotionally/Mentally/Behavior: Patient's mood has been down. She beats herself up for getting involved with a married man. She shared her experience of meeting the man, becoming friends with the family and how he dropped her after his wife left him. Patient had dated this man when they were younger and was caught up in reconnecting with him. She was left feeling used, and foolish after the relationship ended. She is trying to focus on the day to day activities. She recognizes that she can't change the past and can only move forward.   Patient engaged in session. She responded well to interventions. Patient continues to meet criteria for Major depressive  disorder, recurrent episode, moderate. Patient will continue in outpatient therapy due to being the least restrictive service to meet her needs. Patient made minimal progress on her goals.  Suicidal/Homicidal: Negativewithout intent/plan  Therapist Response: Therapist reviewed patient's recent thoughts and behaviors. Therapist utilized CBT to address mood. Therapist processed patient's feelings and thoughts to identify triggers for mood. Therapist had patient tell her "story" and take ownership of her choices so that she can start a new chapter in her life.   Plan: Return again in 4 weeks.  Diagnosis: Axis I: Major depressive disorder, recurrent episode, moderate    Axis II: No diagnosis    Glori Bickers, LCSW 11/19/2017

## 2017-11-26 ENCOUNTER — Ambulatory Visit (INDEPENDENT_AMBULATORY_CARE_PROVIDER_SITE_OTHER): Payer: Medicare HMO

## 2017-11-26 DIAGNOSIS — Z95810 Presence of automatic (implantable) cardiac defibrillator: Secondary | ICD-10-CM

## 2017-11-26 DIAGNOSIS — I5022 Chronic systolic (congestive) heart failure: Secondary | ICD-10-CM

## 2017-11-27 ENCOUNTER — Telehealth: Payer: Self-pay

## 2017-11-27 NOTE — Telephone Encounter (Signed)
Remote ICM transmission received.  Attempted call to patient regarding ICM remote transmission and recording stated service is not temporarily not in service

## 2017-11-27 NOTE — Progress Notes (Signed)
EPIC Encounter for ICM Monitoring  Patient Name: [name] is a 62 y.o. female Date: 11/27/2017 Primary Care Physican: Glenda Chroman, MD Primary Cardiologist:Branch Electrophysiologist:Allred Bi-V Pacing:99% Last Weight:  185lbs  Today's Weight:  unknown       Attempted call to patient and unable to reach.    Transmission reviewed.    Thoracic impedance returned to normal after last ICM transmission 11/16/2017 and taking PRN Furosemide x 3 days.  Impedance slightly below baseline x 1 day  Prescribed: Torsemide20 mg 1 tablet as needed.  Recommendations:  Unable to reach.  Follow-up plan: ICM clinic phone appointment on 12/27/2017.      Copy of ICM check sent to Dr. Rayann Heman.   3 month ICM trend: 11/26/2017    1 Year ICM trend:       [name], RN 11/27/2017 11:54 AM

## 2017-11-30 LAB — CUP PACEART REMOTE DEVICE CHECK
Brady Statistic AP VS Percent: 1 %
Brady Statistic AS VP Percent: 59 %
Brady Statistic RA Percent Paced: 40 %
Date Time Interrogation Session: 20191007060017
HIGH POWER IMPEDANCE MEASURED VALUE: 77 Ohm
HighPow Impedance: 77 Ohm
Implantable Lead Implant Date: 20160104
Implantable Lead Location: 753858
Implantable Lead Location: 753859
Implantable Lead Model: 7122
Implantable Pulse Generator Implant Date: 20160104
Lead Channel Impedance Value: 400 Ohm
Lead Channel Impedance Value: 480 Ohm
Lead Channel Pacing Threshold Amplitude: 0.625 V
Lead Channel Pacing Threshold Amplitude: 0.75 V
Lead Channel Pacing Threshold Amplitude: 1 V
Lead Channel Pacing Threshold Pulse Width: 0.5 ms
Lead Channel Sensing Intrinsic Amplitude: 2 mV
Lead Channel Setting Pacing Amplitude: 2 V
Lead Channel Setting Pacing Amplitude: 2.5 V
Lead Channel Setting Pacing Pulse Width: 0.5 ms
Lead Channel Setting Pacing Pulse Width: 0.5 ms
MDC IDC LEAD IMPLANT DT: 20160104
MDC IDC LEAD IMPLANT DT: 20160104
MDC IDC LEAD LOCATION: 753860
MDC IDC MSMT BATTERY REMAINING LONGEVITY: 43 mo
MDC IDC MSMT BATTERY REMAINING PERCENTAGE: 51 %
MDC IDC MSMT BATTERY VOLTAGE: 2.96 V
MDC IDC MSMT LEADCHNL LV IMPEDANCE VALUE: 990 Ohm
MDC IDC MSMT LEADCHNL LV PACING THRESHOLD PULSEWIDTH: 0.5 ms
MDC IDC MSMT LEADCHNL RV PACING THRESHOLD PULSEWIDTH: 0.5 ms
MDC IDC MSMT LEADCHNL RV SENSING INTR AMPL: 11.7 mV
MDC IDC SET LEADCHNL RA PACING AMPLITUDE: 1.75 V
MDC IDC SET LEADCHNL RV SENSING SENSITIVITY: 0.5 mV
MDC IDC STAT BRADY AP VP PERCENT: 40 %
MDC IDC STAT BRADY AS VS PERCENT: 1 %
Pulse Gen Serial Number: 7222096

## 2017-12-27 ENCOUNTER — Ambulatory Visit (HOSPITAL_COMMUNITY): Payer: Medicare HMO | Admitting: Licensed Clinical Social Worker

## 2017-12-27 ENCOUNTER — Ambulatory Visit (INDEPENDENT_AMBULATORY_CARE_PROVIDER_SITE_OTHER): Payer: Medicare HMO

## 2017-12-27 ENCOUNTER — Encounter (HOSPITAL_COMMUNITY): Payer: Self-pay | Admitting: Licensed Clinical Social Worker

## 2017-12-27 DIAGNOSIS — Z95810 Presence of automatic (implantable) cardiac defibrillator: Secondary | ICD-10-CM | POA: Diagnosis not present

## 2017-12-27 DIAGNOSIS — I5022 Chronic systolic (congestive) heart failure: Secondary | ICD-10-CM

## 2017-12-27 DIAGNOSIS — F331 Major depressive disorder, recurrent, moderate: Secondary | ICD-10-CM

## 2017-12-27 NOTE — Progress Notes (Deleted)
[place] MD/PA/NP OP Progress Note  12/27/2017 12:05 PM [name]  MRN:  [id number]  Chief Complaint:  HPI: *** Visit Diagnosis: No diagnosis found.  Past Psychiatric History: Please see initial evaluation for full details. I have reviewed the history. No updates at this time.     Past Medical History:  Past Medical History:  Diagnosis Date  . Anxiety   . Arthritis    "joints; elbows; knees; back; neck:" (08/30/2016)  . Asthma   . Cancer of skin, face   . Chronic back pain    "mid to upper" (08/30/2016)  . Chronic bronchitis ([place])   . Chronic kidney failure, stage 2 (mild)   . Chronic systolic HF (heart failure) ([place])    a. NICM b. RHC (09/08/13): RA: 2, RV 26/0/2, PA: 25/9 (15), PCWP: 9, Fick CO/CI: 4.6 / 2.5, PA 68% c. ECHO (08/2013) EF 20-25%, grade II DD, RV sys fx mildly reduced, mod TR  . COPD (chronic obstructive pulmonary disease) ([place])   . Depression   . Fibromyalgia   . GERD (gastroesophageal reflux disease)   . Hypothyroidism   . ICD (implantable cardioverter-defibrillator), dual, in situ 01/12/2014   [employer] Jude ; for primary prevention for EF<35% by Dr Caryl Comes  . Left bundle branch block   . NICM (nonischemic cardiomyopathy) ([place])    a. LHC (08/2013): no angiographic evidence of CAD  . OSA on CPAP   . Pneumonia    "several times" (08/30/2016)    Past Surgical History:  Procedure Laterality Date  . BI-VENTRICULAR IMPLANTABLE CARDIOVERTER DEFIBRILLATOR N/A 01/12/2014   SJM Quadra Assura CRT-D biv ICD implanted by Dr Caryl Comes for primary prevention of sudden death  . CARDIAC CATHETERIZATION    . CHOLECYSTECTOMY N/A 08/30/2016   Procedure: LAPAROSCOPIC CHOLECYSTECTOMY WITH INTRAOPERATIVE CHOLANGIOGRAM;  Surgeon: Fanny Skates, MD;  Location: [facility];  Service: General;  Laterality: N/A;  . FRACTURE SURGERY    . HUMERUS FRACTURE SURGERY Left    "fell after I had my RCR"  . LAPAROSCOPIC CHOLECYSTECTOMY  08/30/2016   w/IOC  . LAPAROSCOPIC GASTRIC BANDING    . LEFT AND RIGHT  HEART CATHETERIZATION WITH CORONARY ANGIOGRAM N/A 09/08/2013   Procedure: LEFT AND RIGHT HEART CATHETERIZATION WITH CORONARY ANGIOGRAM;  Surgeon: Burnell Blanks, MD;  Location: [facility] CATH LAB;  Service: Cardiovascular;  Laterality: N/A;  . SHOULDER OPEN ROTATOR CUFF REPAIR Left   . TONSILLECTOMY    . TUBAL LIGATION    . VAGINAL HYSTERECTOMY      Family Psychiatric History: Please see initial evaluation for full details. I have reviewed the history. No updates at this time.     Family History:  Family History  Problem Relation Age of Onset  . Hypertension Mother        deceased: adenocarcinoma, HLD  . Cancer Father        deceased: basal cell carcinoma  . Alcohol abuse Father   . Cancer Sister        breast  . Cancer Brother        skin  . Hyperlipidemia Brother     Social History:  Social History   Socioeconomic History  . Marital status: Widowed    Spouse name: Not on file  . Number of children: Not on file  . Years of education: Not on file  . Highest education level: Not on file  Occupational History  . Not on file  Social Needs  . Financial resource strain: Not on file  . Food insecurity:  Worry: Not on file    Inability: Not on file  . Transportation needs:    Medical: Not on file    Non-medical: Not on file  Tobacco Use  . Smoking status: Former Smoker    Packs/day: 1.00    Years: 16.00    Pack years: 16.00    Types: Cigarettes    Start date: 04/22/1972    Last attempt to quit: 09/25/1988    Years since quitting: 29.2  . Smokeless tobacco: Never Used  Substance and Sexual Activity  . Alcohol use: Yes    Alcohol/week: 0.0 standard drinks    Comment: 08/30/2016 "mixed drink q 2-3 months"  . Drug use: No  . Sexual activity: Not Currently  Lifestyle  . Physical activity:    Days per week: Not on file    Minutes per session: Not on file  . Stress: Not on file  Relationships  . Social connections:    Talks on phone: Not on file    Gets  together: Not on file    Attends religious service: Not on file    Active member of club or organization: Not on file    Attends meetings of clubs or organizations: Not on file    Relationship status: Not on file  Other Topics Concern  . Not on file  Social History Narrative   Lives in [place] by herself and daughter lives next door. Retired [name]    Allergies:  Allergies  Allergen Reactions  . Nexium [Esomeprazole Magnesium] Swelling and Rash  . Nucynta [Tapentadol] Itching  . Oxycodone Itching    Metabolic Disorder Labs: No results found for: HGBA1C, MPG No results found for: PROLACTIN Lab Results  Component Value Date   CHOL 226 (H) 01/18/2014   TRIG 202 (H) 01/18/2014   HDL 62 01/18/2014   CHOLHDL 3.6 01/18/2014   VLDL 40 01/18/2014   LDLCALC 124 (H) 01/18/2014   Lab Results  Component Value Date   TSH 0.841 06/20/2016   TSH 0.085 (L) 09/11/2014    Therapeutic Level Labs: No results found for: LITHIUM No results found for: VALPROATE No components found for:  CBMZ  Current Medications: Current Outpatient Medications  Medication Sig Dispense Refill  . acetaminophen (TYLENOL) 500 MG tablet Take 1,000 mg by mouth 2 (two) times daily as needed for moderate pain or headache.    Marland Kitchen aspirin EC 81 MG EC tablet Take 1 tablet (81 mg total) by mouth daily.    Marland Kitchen atorvastatin (LIPITOR) 20 MG tablet Take 20 mg by mouth at bedtime.    . cetirizine (ZYRTEC) 10 MG tablet Take 10 mg by mouth at bedtime.     . diazepam (VALIUM) 5 MG tablet Take 2.5-5 mg by mouth 2 (two) times daily as needed for anxiety.     . diclofenac sodium (VOLTAREN) 1 % GEL Apply 1 application topically 3 (three) times daily as needed (fibromyalgia).     Marland Kitchen docusate sodium (COLACE) 100 MG capsule Take 200 mg by mouth at bedtime as needed for mild constipation.    Marland Kitchen EPINEPHrine (EPIPEN 2-PAK) 0.3 mg/0.3 mL IJ SOAJ injection Inject 0.3 mLs (0.3 mg total) into the muscle once as needed (for severe allergic reaction).  CAll 911 immediately if you have to use this medicine 1 Device 1  . gabapentin (NEURONTIN) 300 MG capsule Take 600 mg by mouth 3 (three) times daily.     Marland Kitchen HYDROcodone-acetaminophen (NORCO) 10-325 MG tablet Take 1 tablet by mouth every 6 (six) hours  as needed.    . hydrocortisone cream 1 % Apply 1 application topically 2 (two) times daily as needed for itching.    . hydrOXYzine (ATARAX/VISTARIL) 10 MG tablet Take 1 tablet (10 mg total) by mouth every 6 (six) hours as needed for itching. 20 tablet 0  . levothyroxine (SYNTHROID, LEVOTHROID) 112 MCG tablet Take 112 mcg by mouth daily before breakfast.    . loperamide (IMODIUM) 2 MG capsule Take 1 capsule (2 mg total) by mouth 4 (four) times daily as needed for diarrhea or loose stools. 30 capsule 0  . losartan (COZAAR) 25 MG tablet TAKE 1/2 TABLET BY MOUTH DAILY 45 tablet 4  . metoprolol succinate (TOPROL-XL) 25 MG 24 hr tablet Take 0.5 tablets (12.5 mg total) by mouth daily. 45 tablet 3  . Multiple Vitamin (MULTIVITAMIN WITH MINERALS) TABS tablet Take 1 tablet by mouth daily.    . Multiple Vitamins-Minerals (WOMENS MULTIVITAMIN) TABS Take 1 tablet by mouth daily.    Marland Kitchen OVER THE COUNTER MEDICATION Take by mouth 2 (two) times daily. AND RED :  VITAMIN: MACULUAR DEGENERATION    . polyethylene glycol (MIRALAX / GLYCOLAX) packet Take 17 g by mouth daily as needed for mild constipation.     . Probiotic CAPS Take 1 capsule by mouth daily.    . promethazine (PHENERGAN) 25 MG tablet Take 25 mg by mouth daily as needed for nausea or vomiting.     . ranitidine (ZANTAC) 150 MG tablet Take 150 mg by mouth 2 (two) times daily.     . sertraline (ZOLOFT) 100 MG tablet Take 1.5 tablets (150 mg total) by mouth daily. 135 tablet 0  . spironolactone (ALDACTONE) 25 MG tablet Take 0.5 tablets (12.5 mg total) by mouth at bedtime. 45 tablet 3  . tiZANidine (ZANAFLEX) 4 MG tablet Take 2-4 mg by mouth every 8 (eight) hours as needed for muscle spasms (for fibromyalgia).     .  torsemide (DEMADEX) 20 MG tablet Take 1 tablet (20 mg total) by mouth daily as needed (FOR SWELLING). 30 tablet 6  . TURMERIC PO Take 100 mg by mouth daily.     No current facility-administered medications for this visit.      Musculoskeletal: Strength & Muscle Tone: within normal limits Gait & Station: normal Patient leans: N/A  Psychiatric Specialty Exam: ROS  There were no vitals taken for this visit.There is no height or weight on file to calculate BMI.  General Appearance: Fairly Groomed  Eye Contact:  Good  Speech:  Clear and Coherent  Volume:  Normal  Mood:  {BHH MOOD:22306}  Affect:  {Affect (PAA):22687}  Thought Process:  Coherent  Orientation:  Full (Time, Place, and Person)  Thought Content: Logical   Suicidal Thoughts:  {ST/HT (PAA):22692}  Homicidal Thoughts:  {ST/HT (PAA):22692}  Memory:  Immediate;   Good  Judgement:  {Judgement (PAA):22694}  Insight:  {Insight (PAA):22695}  Psychomotor Activity:  Normal  Concentration:  Concentration: Good and Attention Span: Good  Recall:  Good  Fund of Knowledge: Good  Language: Good  Akathisia:  No  Handed:  Right  AIMS (if indicated): not done  Assets:  Communication Skills Desire for Improvement  ADL's:  Intact  Cognition: WNL  Sleep:  {BHH GOOD/FAIR/POOR:22877}   Screenings: PHQ2-9     Nutrition from 04/12/2016 in [facility]  PHQ-2 Total Score  1       Assessment and Plan:  [name] is a 62 y.o. year old female with a  history of depression, fibromyalgia, Chronic systolic HF/NICM, s/p ICD, who presents for follow up appointment for No diagnosis found.  # MDD, moderate, recurrent without psychotic features  Has been overall improvement in neurovegetative symptoms after up titration of sertraline and completing IOP.  Psychosocial stressors including recent break-up, pain from fibromyalgia, loss of her husband several years ago, and her son with medical disease.   Will continue sertraline to target depression.  She is on Valium as needed for anxiety, prescribed by PCP.  Discussed risk of dependence and somnolence.  Discussed behavioral activation.   Plan  1. Continue sertraline 150 mg daily  2.Continue valium 5 mg daily prn for anxiety  3. Return to clinic inthree months for 15 mins - on Gabapentin for pruritis from shingles, pain  The patient demonstrates the following risk factors for suicide: Chronic risk factors for suicide include:psychiatric disorder ofdepression. Acute risk factorsfor suicide include: family or marital conflict and unemployment. Protective factorsfor this patient include: responsibility to others (children, family), coping skills and hope for the future. Considering these factors, the overall suicide risk at this point appears to below. Patientisappropriate for outpatient follow up. Although she does have gus at home, she adamantly denies any plan/intent and is amenable to the treatment.  Norman Clay, MD 12/27/2017, 12:05 PM

## 2017-12-27 NOTE — Progress Notes (Signed)
   THERAPIST PROGRESS NOTE  Session Time: 4:00 am-4:40 am  Participation Level: Active  Behavioral Response: CasualAlertDepressed  Type of Therapy: Individual Therapy  Treatment Goals addressed: Coping  Interventions: CBT and Solution Focused  Summary: [name] is a 62 y.o. female who presents oriented x5 (person, place, situation, time, and object), casually dressed, appropriately groomed, average height, average weight, and cooperative to address mood. Patient has a history of medical treatment including COPD, kidney failure, Fibromyalgia, and heart conditions. Patient has a history of mental health treatment. Patient denies suicidal and homicidal ideations. Patient denies psychosis including auditory and visual hallucinations. Patient denies substance abuse. Patient is at low risk for lethality at this time.  Physically: Patient's pain level has been consistent. Marland Kitchen  Spiritually/values:  No issues identified.  Relationships: No issues identified.  Emotionally/Mentally/Behavior: Patient's mood has been down. She didn't think that she would be alone during the holidays. After discussion, patient was able to identify ways to improve her mood including exercising, potentially working with someone to get cars to [place], and serving her neighbor. Patient understands that she will not feel this way forever. She is going to be patient and get through the holidays.   Patient engaged in session. She responded well to interventions. Patient continues to meet criteria for Major depressive disorder, recurrent episode, moderate. Patient will continue in outpatient therapy due to being the least restrictive service to meet her needs. Patient made minimal progress on her goals.  Suicidal/Homicidal: Negativewithout intent/plan  Therapist Response: Therapist reviewed patient's recent thoughts and behaviors. Therapist utilized CBT to address mood. Therapist processed patient's feelings and thoughts to  identify triggers for mood. Therapist assisted patient identifying ways to manage her mood.   Plan: Return again in 4 weeks.  Diagnosis: Axis I: Major depressive disorder, recurrent episode, moderate    Axis II: No diagnosis    Glori Bickers, LCSW 12/27/2017

## 2017-12-28 NOTE — Progress Notes (Signed)
EPIC Encounter for ICM Monitoring  Patient Name: [name] is a 62 y.o. female Date: 12/28/2017 Primary Care Physican: Glenda Chroman, MD Primary Cardiologist:Branch Electrophysiologist:Allred Bi-V Pacing:99% LastWeight:185lbs Today's Weight:  unknown                                                   Transmission reviewed.    Thoracic impedance normal but was abnormal from 12/16/2017 - 12/23/2017.   Prescribed: Torsemide20 mg 1 tablet as needed.  Recommendations:  None  Follow-up plan: ICM clinic phone appointment on 01/31/2018.      Copy of ICM check sent to Dr. Rayann Heman.   3 month ICM trend: 12/27/2017    1 Year ICM trend:       [name], RN 12/28/2017 2:13 PM

## 2018-01-07 ENCOUNTER — Ambulatory Visit (HOSPITAL_COMMUNITY): Payer: Medicare HMO | Admitting: Psychiatry

## 2018-01-17 ENCOUNTER — Encounter: Payer: Self-pay | Admitting: Cardiology

## 2018-01-23 ENCOUNTER — Ambulatory Visit (INDEPENDENT_AMBULATORY_CARE_PROVIDER_SITE_OTHER): Payer: Medicare HMO

## 2018-01-23 DIAGNOSIS — I5022 Chronic systolic (congestive) heart failure: Secondary | ICD-10-CM | POA: Diagnosis not present

## 2018-01-23 DIAGNOSIS — I428 Other cardiomyopathies: Secondary | ICD-10-CM

## 2018-01-24 NOTE — Progress Notes (Signed)
Remote ICD transmission.   

## 2018-01-27 ENCOUNTER — Other Ambulatory Visit: Payer: Self-pay | Admitting: Cardiology

## 2018-01-27 DIAGNOSIS — I5022 Chronic systolic (congestive) heart failure: Secondary | ICD-10-CM

## 2018-01-27 LAB — CUP PACEART REMOTE DEVICE CHECK
Battery Remaining Percentage: 48 %
Brady Statistic AS VP Percent: 62 %
Brady Statistic AS VS Percent: 1 %
Brady Statistic RA Percent Paced: 37 %
HIGH POWER IMPEDANCE MEASURED VALUE: 77 Ohm
HighPow Impedance: 77 Ohm
Implantable Lead Implant Date: 20160104
Implantable Lead Implant Date: 20160104
Implantable Lead Location: 753858
Implantable Lead Model: 7122
Lead Channel Impedance Value: 390 Ohm
Lead Channel Pacing Threshold Amplitude: 0.625 V
Lead Channel Pacing Threshold Amplitude: 0.625 V
Lead Channel Sensing Intrinsic Amplitude: 3.1 mV
Lead Channel Setting Pacing Amplitude: 2 V
Lead Channel Setting Pacing Amplitude: 2.5 V
Lead Channel Setting Pacing Pulse Width: 0.5 ms
Lead Channel Setting Sensing Sensitivity: 0.5 mV
MDC IDC LEAD IMPLANT DT: 20160104
MDC IDC LEAD LOCATION: 753859
MDC IDC LEAD LOCATION: 753860
MDC IDC MSMT BATTERY REMAINING LONGEVITY: 40 mo
MDC IDC MSMT BATTERY VOLTAGE: 2.96 V
MDC IDC MSMT LEADCHNL LV IMPEDANCE VALUE: 760 Ohm
MDC IDC MSMT LEADCHNL LV PACING THRESHOLD PULSEWIDTH: 0.5 ms
MDC IDC MSMT LEADCHNL RA IMPEDANCE VALUE: 440 Ohm
MDC IDC MSMT LEADCHNL RA PACING THRESHOLD PULSEWIDTH: 0.5 ms
MDC IDC MSMT LEADCHNL RV PACING THRESHOLD AMPLITUDE: 1 V
MDC IDC MSMT LEADCHNL RV PACING THRESHOLD PULSEWIDTH: 0.5 ms
MDC IDC MSMT LEADCHNL RV SENSING INTR AMPL: 10.4 mV
MDC IDC PG IMPLANT DT: 20160104
MDC IDC PG SERIAL: 7222096
MDC IDC SESS DTM: 20200115120659
MDC IDC SET LEADCHNL RA PACING AMPLITUDE: 1.625
MDC IDC SET LEADCHNL RV PACING PULSEWIDTH: 0.5 ms
MDC IDC STAT BRADY AP VP PERCENT: 36 %
MDC IDC STAT BRADY AP VS PERCENT: 1 %

## 2018-01-31 ENCOUNTER — Ambulatory Visit (INDEPENDENT_AMBULATORY_CARE_PROVIDER_SITE_OTHER): Payer: Medicare HMO | Admitting: Licensed Clinical Social Worker

## 2018-01-31 ENCOUNTER — Ambulatory Visit (INDEPENDENT_AMBULATORY_CARE_PROVIDER_SITE_OTHER): Payer: Medicare HMO

## 2018-01-31 ENCOUNTER — Encounter (HOSPITAL_COMMUNITY): Payer: Self-pay | Admitting: Licensed Clinical Social Worker

## 2018-01-31 DIAGNOSIS — I5022 Chronic systolic (congestive) heart failure: Secondary | ICD-10-CM | POA: Diagnosis not present

## 2018-01-31 DIAGNOSIS — F331 Major depressive disorder, recurrent, moderate: Secondary | ICD-10-CM

## 2018-01-31 DIAGNOSIS — Z95810 Presence of automatic (implantable) cardiac defibrillator: Secondary | ICD-10-CM

## 2018-01-31 NOTE — Progress Notes (Signed)
   THERAPIST PROGRESS NOTE  Session Time: 9:00 am-9:40 am  Participation Level: Active  Behavioral Response: CasualAlertDepressed  Type of Therapy: Individual Therapy  Treatment Goals addressed: Coping  Interventions: CBT and Solution Focused  Summary: [name] is a 63 y.o. female who presents oriented x5 (person, place, situation, time, and object), casually dressed, appropriately groomed, average height, average weight, and cooperative to address mood. Patient has a history of medical treatment including COPD, kidney failure, Fibromyalgia, and heart conditions. Patient has a history of mental health treatment. Patient denies suicidal and homicidal ideations. Patient denies psychosis including auditory and visual hallucinations. Patient denies substance abuse. Patient is at low risk for lethality at this time.  Physically: Patient has been struggling with pain. The weather changes impacts her pain. She has disrupted sleep.   Spiritually/values:  No issues identified.  Relationships: Patient was contacted by the man she was interested in. Patient noted that he is divorced and he waited a few months to contact her. Patient is talking to him but they are taking it slow. Patient is reserved, relieved, cautious, and scared about the relationship. She has a healthy level of distrust for the situation.  Emotionally/Mentally/Behavior: Patient's mood has been good. She is anxious about going to [place] to the man she is interested in because she is not sure if it will work out or if her feelings have changed.   Patient engaged in session. She responded well to interventions. Patient continues to meet criteria for Major depressive disorder, recurrent episode, moderate. Patient will continue in outpatient therapy due to being the least restrictive service to meet her needs. Patient made minimal progress on her goals.  Suicidal/Homicidal: Negativewithout intent/plan  Therapist Response:  Therapist reviewed patient's recent thoughts and behaviors. Therapist utilized CBT to address mood. Therapist processed patient's feelings and thoughts to identify triggers for mood. Therapist discussed patient's possible relationship, her concerns, expectations, and aspects of healthy relationships.   Plan: Return again in 4 weeks.  Diagnosis: Axis I: Major depressive disorder, recurrent episode, moderate    Axis II: No diagnosis    Glori Bickers, LCSW 01/31/2018

## 2018-02-01 ENCOUNTER — Telehealth: Payer: Self-pay

## 2018-02-01 NOTE — Progress Notes (Signed)
EPIC Encounter for ICM Monitoring  Patient Name: KASSY MCENROE is a 63 y.o. female Date: 02/01/2018 Primary Care Physican: Glenda Chroman, MD Primary Cardiologist:Branch Electrophysiologist:Allred Bi-V Pacing:99% LastWeight:185lbs Today's Weight: unknown   Attempted call to patient.  Left detailed message per DPR regarding transmission. Transmission reviewed.   Thoracic impedanceabnormal suggesting dryness starting 01/29/2018.   Prescribed:Torsemide20 mg 1 tablet as needed.  Recommendations:Unable to reach.  Follow-up plan: ICM clinic phone appointment [phone].   Copy of ICM check sent to Dr.Allred.   3 month ICM trend: 01/31/2018    1 Year ICM trend:       [name], RN 02/01/2018 3:07 PM

## 2018-02-01 NOTE — Telephone Encounter (Signed)
Remote ICM transmission received.  Attempted call to patient regarding ICM remote transmission and left detailed message, per DPR, with next ICM remote transmission date of 03/04/2018.  Advised to return call for any fluid symptoms or questions.   

## 2018-02-05 NOTE — Progress Notes (Signed)
[facility] MD/PA/NP OP Progress Note  02/08/2018 9:30 AM KHRISTIAN SEALS  MRN:  [id number]  Chief Complaint:  Chief Complaint    Depression; Follow-up     HPI:  Patient presents for follow-up appointment for depression.  She states that she has decided to meet with her ex-boyfriend, and see if they have mutual feelings.  She had a good time on holidays, spending time with her youngest son in [place].  Her oldest son with Charcot neuropathy has been struggling with pain.  She is concerned what would happen if he were to become wheelchair-bound.  She has fair sleep.  She denies feeling depressed.  She has good motivation.  She denies SI.  She occasionally feels anxious and tense.  She took a few Valium over the past 2 weeks for anxiety.  She denies panic attacks.    Valium filled on 10/17/2017   Visit Diagnosis:    ICD-10-CM   1. MDD (major depressive disorder), recurrent, in partial remission (HCC) F33.41 sertraline (ZOLOFT) 100 MG tablet    Past Psychiatric History: Please see initial evaluation for full details. I have reviewed the history. No updates at this time.     Past Medical History:  Past Medical History:  Diagnosis Date  . Anxiety   . Arthritis    "joints; elbows; knees; back; neck:" (08/30/2016)  . Asthma   . Cancer of skin, face   . Chronic back pain    "mid to upper" (08/30/2016)  . Chronic bronchitis ([place])   . Chronic kidney failure, stage 2 (mild)   . Chronic systolic HF (heart failure) ([place])    a. NICM b. RHC (09/08/13): RA: 2, RV 26/0/2, PA: 25/9 (15), PCWP: 9, Fick CO/CI: 4.6 / 2.5, PA 68% c. ECHO (08/2013) EF 20-25%, grade II DD, RV sys fx mildly reduced, mod TR  . COPD (chronic obstructive pulmonary disease) ([place])   . Depression   . Fibromyalgia   . GERD (gastroesophageal reflux disease)   . Hypothyroidism   . ICD (implantable cardioverter-defibrillator), dual, in situ 01/12/2014   [employer] Jude ; for primary prevention for EF<35% by Dr Caryl Comes  . Left bundle branch block    . NICM (nonischemic cardiomyopathy) ([place])    a. LHC (08/2013): no angiographic evidence of CAD  . OSA on CPAP   . Pneumonia    "several times" (08/30/2016)    Past Surgical History:  Procedure Laterality Date  . BI-VENTRICULAR IMPLANTABLE CARDIOVERTER DEFIBRILLATOR N/A 01/12/2014   SJM Quadra Assura CRT-D biv ICD implanted by Dr Caryl Comes for primary prevention of sudden death  . CARDIAC CATHETERIZATION    . CHOLECYSTECTOMY N/A 08/30/2016   Procedure: LAPAROSCOPIC CHOLECYSTECTOMY WITH INTRAOPERATIVE CHOLANGIOGRAM;  Surgeon: Fanny Skates, MD;  Location: [facility];  Service: General;  Laterality: N/A;  . FRACTURE SURGERY    . HUMERUS FRACTURE SURGERY Left    "fell after I had my RCR"  . LAPAROSCOPIC CHOLECYSTECTOMY  08/30/2016   w/IOC  . LAPAROSCOPIC GASTRIC BANDING    . LEFT AND RIGHT HEART CATHETERIZATION WITH CORONARY ANGIOGRAM N/A 09/08/2013   Procedure: LEFT AND RIGHT HEART CATHETERIZATION WITH CORONARY ANGIOGRAM;  Surgeon: Burnell Blanks, MD;  Location: [facility] C[facility];  Service: Cardiovascular;  Laterality: N/A;  . SHOULDER OPEN ROTATOR CUFF REPAIR Left   . TONSILLECTOMY    . TUBAL LIGATION    . VAGINAL HYSTERECTOMY      Family Psychiatric History: Please see initial evaluation for full details. I have reviewed the history. No updates at this  time.     Family History:  Family History  Problem Relation Age of Onset  . Hypertension Mother        deceased: adenocarcinoma, HLD  . Cancer Father        deceased: basal cell carcinoma  . Alcohol abuse Father   . Cancer Sister        breast  . Cancer Brother        skin  . Hyperlipidemia Brother     Social History:  Social History   Socioeconomic History  . Marital status: Widowed    Spouse name: Not on file  . Number of children: Not on file  . Years of education: Not on file  . Highest education level: Not on file  Occupational History  . Not on file  Social Needs  . Financial resource strain: Not on file  . Food  insecurity:    Worry: Not on file    Inability: Not on file  . Transportation needs:    Medical: Not on file    Non-medical: Not on file  Tobacco Use  . Smoking status: Former Smoker    Packs/day: 1.00    Years: 16.00    Pack years: 16.00    Types: Cigarettes    Start date: 04/22/1972    Last attempt to quit: 09/25/1988    Years since quitting: 29.3  . Smokeless tobacco: Never Used  Substance and Sexual Activity  . Alcohol use: Yes    Alcohol/week: 0.0 standard drinks    Comment: 08/30/2016 "mixed drink q 2-3 months"  . Drug use: No  . Sexual activity: Not Currently  Lifestyle  . Physical activity:    Days per week: Not on file    Minutes per session: Not on file  . Stress: Not on file  Relationships  . Social connections:    Talks on phone: Not on file    Gets together: Not on file    Attends religious service: Not on file    Active member of club or organization: Not on file    Attends meetings of clubs or organizations: Not on file    Relationship status: Not on file  Other Topics Concern  . Not on file  Social History Narrative   Lives in [place] by herself and daughter lives next door. Retired [place]    Allergies:  Allergies  Allergen Reactions  . Nexium [Esomeprazole Magnesium] Swelling and Rash  . Nucynta [Tapentadol] Itching  . Oxycodone Itching    Metabolic Disorder Labs: No results found for: HGBA1C, MPG No results found for: PROLACTIN Lab Results  Component Value Date   CHOL 226 (H) 01/18/2014   TRIG 202 (H) 01/18/2014   HDL 62 01/18/2014   CHOLHDL 3.6 01/18/2014   VLDL 40 01/18/2014   LDLCALC 124 (H) 01/18/2014   Lab Results  Component Value Date   TSH 0.841 06/20/2016   TSH 0.085 (L) 09/11/2014    Therapeutic Level Labs: No results found for: LITHIUM No results found for: VALPROATE No components found for:  CBMZ  Current Medications: Current Outpatient Medications  Medication Sig Dispense Refill  . acetaminophen (TYLENOL) 500 MG tablet  Take 1,000 mg by mouth 2 (two) times daily as needed for moderate pain or headache.    Marland Kitchen aspirin EC 81 MG EC tablet Take 1 tablet (81 mg total) by mouth daily.    Marland Kitchen atorvastatin (LIPITOR) 20 MG tablet Take 20 mg by mouth at bedtime.    . cetirizine (ZYRTEC)  10 MG tablet Take 10 mg by mouth at bedtime.     . diazepam (VALIUM) 5 MG tablet Take 2.5-5 mg by mouth 2 (two) times daily as needed for anxiety.     . diclofenac sodium (VOLTAREN) 1 % GEL Apply 1 application topically 3 (three) times daily as needed (fibromyalgia).     Marland Kitchen docusate sodium (COLACE) 100 MG capsule Take 200 mg by mouth at bedtime as needed for mild constipation.    Marland Kitchen EPINEPHrine (EPIPEN 2-PAK) 0.3 mg/0.3 mL IJ SOAJ injection Inject 0.3 mLs (0.3 mg total) into the muscle once as needed (for severe allergic reaction). CAll 911 immediately if you have to use this medicine 1 Device 1  . gabapentin (NEURONTIN) 300 MG capsule Take 600 mg by mouth 3 (three) times daily.     Marland Kitchen HYDROcodone-acetaminophen (NORCO) 10-325 MG tablet Take 1 tablet by mouth every 6 (six) hours as needed.    . hydrocortisone cream 1 % Apply 1 application topically 2 (two) times daily as needed for itching.    . hydrOXYzine (ATARAX/VISTARIL) 10 MG tablet Take 1 tablet (10 mg total) by mouth every 6 (six) hours as needed for itching. 20 tablet 0  . levothyroxine (SYNTHROID, LEVOTHROID) 112 MCG tablet Take 112 mcg by mouth daily before breakfast.    . loperamide (IMODIUM) 2 MG capsule Take 1 capsule (2 mg total) by mouth 4 (four) times daily as needed for diarrhea or loose stools. 30 capsule 0  . losartan (COZAAR) 25 MG tablet TAKE 1/2 TABLET BY MOUTH DAILY 45 tablet 4  . metoprolol succinate (TOPROL-XL) 25 MG 24 hr tablet Take 0.5 tablets (12.5 mg total) by mouth daily. 45 tablet 3  . Multiple Vitamin (MULTIVITAMIN WITH MINERALS) TABS tablet Take 1 tablet by mouth daily.    . Multiple Vitamins-Minerals (WOMENS MULTIVITAMIN) TABS Take 1 tablet by mouth daily.    Marland Kitchen  OVER THE COUNTER MEDICATION Take by mouth 2 (two) times daily. AND RED :  VITAMIN: MACULUAR DEGENERATION    . polyethylene glycol (MIRALAX / GLYCOLAX) packet Take 17 g by mouth daily as needed for mild constipation.     . Probiotic CAPS Take 1 capsule by mouth daily.    . promethazine (PHENERGAN) 25 MG tablet Take 25 mg by mouth daily as needed for nausea or vomiting.     . ranitidine (ZANTAC) 150 MG tablet Take 150 mg by mouth 2 (two) times daily.     . sertraline (ZOLOFT) 100 MG tablet Take 1.5 tablets (150 mg total) by mouth daily. 135 tablet 1  . spironolactone (ALDACTONE) 25 MG tablet Take 0.5 tablets (12.5 mg total) by mouth at bedtime. 45 tablet 3  . tiZANidine (ZANAFLEX) 4 MG tablet Take 2-4 mg by mouth every 8 (eight) hours as needed for muscle spasms (for fibromyalgia).     . torsemide (DEMADEX) 20 MG tablet TAKE 1 TABLET (20 MG TOTAL) BY MOUTH DAILY AS NEEDED (FOR SWELLING). 90 tablet 1  . TURMERIC PO Take 100 mg by mouth daily.     No current facility-administered medications for this visit.      Musculoskeletal: Strength & Muscle Tone: within normal limits Gait & Station: normal Patient leans: N/A  Psychiatric Specialty Exam: Review of Systems  Psychiatric/Behavioral: Negative for depression, hallucinations, memory loss, substance abuse and suicidal ideas. The patient is nervous/anxious. The patient does not have insomnia.   All other systems reviewed and are negative.   Blood pressure 110/72, pulse 65, height 5\' 2"  (1.575 m),  weight 187 lb (84.8 kg), SpO2 96 %.Body mass index is 34.2 kg/m.  General Appearance: Fairly Groomed  Eye Contact:  Good  Speech:  Clear and Coherent  Volume:  Normal  Mood:  "good  Affect:  Appropriate, Congruent and calm  Thought Process:  Coherent  Orientation:  Full (Time, Place, and Person)  Thought Content: Logical   Suicidal Thoughts:  No  Homicidal Thoughts:  No  Memory:  Immediate;   Good  Judgement:  Good  Insight:  Fair   Psychomotor Activity:  Normal  Concentration:  Concentration: Good and Attention Span: Good  Recall:  Good  Fund of Knowledge: Good  Language: Good  Akathisia:  No  Handed:  Right  AIMS (if indicated): not done  Assets:  Communication Skills Desire for Improvement  ADL's:  Intact  Cognition: WNL  Sleep:  Good   Screenings: PHQ2-9     Nutrition from 04/12/2016 in [facility]  PHQ-2 Total Score  1       Assessment and Plan:  ZIA NAJERA is a 63 y.o. year old female with a history of depression,  fibromyalgia,Chronic systolic HF/NICM, s/p ICD, who presents for follow up appointment for MDD (major depressive disorder), recurrent, in partial remission ([place]) - Plan: sertraline (ZOLOFT) 100 MG tablet  # MDD, recurrent, in partial remission Patient reports steady improvement in depressive symptoms since the last visit.  Psychosocial stressors including break-up, pain from fibromyalgia, loss of her husband several years ago, and her son with Charcot neuropathy.  Will continue sertraline to target depression.  She is on Valium as needed for anxiety, prescribed by PCP.  Discussed risk of dependence and somnolence.  Discussed behavioral activation.   Plan I have reviewed and updated plans as below 1. Continue sertraline 150 mg daily  2.Continue valium 5 mg daily prn for anxiety  3. Return to clinic infour months for 15 mins - on Gabapentin for pruritis from shingles, pain  The patient demonstrates the following risk factors for suicide: Chronic risk factors for suicide include:psychiatric disorder ofdepression. Acute risk factorsfor suicide include: family or marital conflict and unemployment. Protective factorsfor this patient include: responsibility to others (children, family), coping skills and hope for the future. Considering these factors, the overall suicide risk at this point appears to below. Patientisappropriate for outpatient  follow up. Although she does have gus at home, she adamantly denies any plan/intent and is amenable to the treatment.  Norman Clay, MD 02/08/2018, 9:30 AM

## 2018-02-08 ENCOUNTER — Ambulatory Visit (INDEPENDENT_AMBULATORY_CARE_PROVIDER_SITE_OTHER): Payer: Medicare HMO | Admitting: Psychiatry

## 2018-02-08 ENCOUNTER — Encounter (HOSPITAL_COMMUNITY): Payer: Self-pay | Admitting: Psychiatry

## 2018-02-08 DIAGNOSIS — F3341 Major depressive disorder, recurrent, in partial remission: Secondary | ICD-10-CM | POA: Diagnosis not present

## 2018-02-08 MED ORDER — SERTRALINE HCL 100 MG PO TABS: 150.0000 mg | ORAL_TABLET | Freq: Every day | ORAL | 1 refills | Status: DC

## 2018-02-08 NOTE — Patient Instructions (Addendum)
1. Continue sertraline 150 mg daily  3. Return to clinic infour months for 15 mins

## 2018-03-04 ENCOUNTER — Ambulatory Visit (INDEPENDENT_AMBULATORY_CARE_PROVIDER_SITE_OTHER): Payer: Medicare HMO

## 2018-03-04 DIAGNOSIS — I5022 Chronic systolic (congestive) heart failure: Secondary | ICD-10-CM

## 2018-03-04 DIAGNOSIS — Z95810 Presence of automatic (implantable) cardiac defibrillator: Secondary | ICD-10-CM

## 2018-03-05 ENCOUNTER — Telehealth: Payer: Self-pay

## 2018-03-05 NOTE — Telephone Encounter (Signed)
Remote ICM transmission received.  Attempted call to patient regarding ICM remote transmission and left detailed message, per DPR, with next ICM remote transmission date of 03/18/2018.  Advised to return call for any fluid symptoms or questions.    

## 2018-03-05 NOTE — Progress Notes (Signed)
EPIC Encounter for ICM Monitoring  Patient Name: [name] is a 63 y.o. female Date: 03/05/2018 Primary Care Physican: Glenda Chroman, MD Primary Cardiologist:Branch Electrophysiologist:Allred Bi-V Pacing:99% LastWeight:185lbs Today's Weight: unknown   Attempted call to patient.  Left detailed message per DPR regarding transmission. Transmission reviewed.   Thoracic impedanceabnormal suggesting fluid accumulation starting 03/03/2018.  Impedance also decreased 02/12/2018 through 02/26/2018 suggesting fluid accumulation.   Prescribed:Torsemide20 mg 1 tablet as needed.  Recommendations:Unable to reach.  Follow-up plan: ICM clinic phone appointment on3/09/2018 to recheck fluid levels.   Copy of ICM check sent to Dr.Allred and Dr Harl Bowie.  3 month ICM trend: 03/04/2018    1 Year ICM trend:       [name], RN 03/05/2018 11:12 AM

## 2018-03-18 ENCOUNTER — Ambulatory Visit (INDEPENDENT_AMBULATORY_CARE_PROVIDER_SITE_OTHER): Payer: Medicare HMO

## 2018-03-18 DIAGNOSIS — Z95810 Presence of automatic (implantable) cardiac defibrillator: Secondary | ICD-10-CM

## 2018-03-18 DIAGNOSIS — I5022 Chronic systolic (congestive) heart failure: Secondary | ICD-10-CM

## 2018-03-19 ENCOUNTER — Ambulatory Visit (HOSPITAL_COMMUNITY): Payer: Medicare HMO | Admitting: Licensed Clinical Social Worker

## 2018-03-19 ENCOUNTER — Telehealth: Payer: Self-pay

## 2018-03-19 NOTE — Telephone Encounter (Signed)
Remote ICM transmission received.  Attempted call to patient regarding ICM remote transmission and left detailed message, per DPR, with next ICM remote transmission date of 04/08/2018.  Advised to return call for any fluid symptoms or questions.   

## 2018-03-19 NOTE — Progress Notes (Signed)
EPIC Encounter for ICM Monitoring  Patient Name: [name] is a 63 y.o. female Date: 03/19/2018 Primary Care Physican: Glenda Chroman, MD Primary Cardiologist:Branch Electrophysiologist:Allred Bi-V Pacing:99% LastWeight:185lbs Today's Weight: unknown   Attempted call to patient. Left detailed message per DPR regarding transmission. Transmission reviewed.   Thoracic impedancereturned to normal since last ICM remote transmission 03/14/2018   Prescribed:Torsemide20 mg 1 tablet as needed.  Recommendations:Unable to reach.  Follow-up plan: ICM clinic phone appointment on3/30/2020.   Copy of ICM check sent to Dr.Allred and Dr Harl Bowie.   3 month ICM trend: 03/18/2018    1 Year ICM trend:       [name], RN 03/19/2018 9:56 AM

## 2018-03-21 ENCOUNTER — Other Ambulatory Visit: Payer: Self-pay | Admitting: Cardiology

## 2018-04-08 ENCOUNTER — Other Ambulatory Visit: Payer: Self-pay

## 2018-04-08 ENCOUNTER — Ambulatory Visit (INDEPENDENT_AMBULATORY_CARE_PROVIDER_SITE_OTHER): Payer: Medicare HMO

## 2018-04-08 DIAGNOSIS — I5022 Chronic systolic (congestive) heart failure: Secondary | ICD-10-CM

## 2018-04-08 DIAGNOSIS — Z95810 Presence of automatic (implantable) cardiac defibrillator: Secondary | ICD-10-CM | POA: Diagnosis not present

## 2018-04-10 ENCOUNTER — Telehealth: Payer: Self-pay

## 2018-04-10 NOTE — Telephone Encounter (Signed)
Remote ICM transmission received.  Attempted call to patient regarding ICM remote transmission and no answer or answering machine. 

## 2018-04-10 NOTE — Progress Notes (Signed)
EPIC Encounter for ICM Monitoring  Patient Name: [name] is a 63 y.o. female Date: 04/10/2018 Primary Care Physican: Glenda Chroman, MD Primary Cardiologist:Branch Electrophysiologist:Allred Bi-V Pacing:99% LastWeight:185lbs 04/10/2018 Weight: unknown   Attempted call to patient and unable to reach. Transmission reviewed.   Thoracic impedancereturned to normal since last ICM remote transmission 03/14/2018   Prescribed:Torsemide20 mg 1 tablet as needed.  Recommendations:Unable to reach.  Follow-up plan: ICM clinic phone appointment on5/04/2018.   Copy of ICM check sent to Dr.Allred.  3 month ICM trend: 04/08/2018    1 Year ICM trend:       [name], RN 04/10/2018 12:34 PM

## 2018-04-24 ENCOUNTER — Ambulatory Visit (INDEPENDENT_AMBULATORY_CARE_PROVIDER_SITE_OTHER): Payer: Medicare HMO | Admitting: *Deleted

## 2018-04-24 ENCOUNTER — Other Ambulatory Visit: Payer: Self-pay

## 2018-04-24 DIAGNOSIS — I428 Other cardiomyopathies: Secondary | ICD-10-CM

## 2018-04-24 DIAGNOSIS — I5022 Chronic systolic (congestive) heart failure: Secondary | ICD-10-CM

## 2018-04-24 LAB — CUP PACEART REMOTE DEVICE CHECK
Battery Remaining Longevity: 38 mo
Battery Remaining Percentage: 45 %
Battery Voltage: 2.95 V
Brady Statistic AP VP Percent: 36 %
Brady Statistic AP VS Percent: 1 %
Brady Statistic AS VP Percent: 62 %
Brady Statistic AS VS Percent: 1 %
Brady Statistic RA Percent Paced: 37 %
Date Time Interrogation Session: 20200415080016
HighPow Impedance: 71 Ohm
HighPow Impedance: 71 Ohm
Implantable Lead Implant Date: 20160104
Implantable Lead Implant Date: 20160104
Implantable Lead Implant Date: 20160104
Implantable Lead Location: 753858
Implantable Lead Location: 753859
Implantable Lead Location: 753860
Implantable Lead Model: 7122
Implantable Pulse Generator Implant Date: 20160104
Lead Channel Impedance Value: 380 Ohm
Lead Channel Impedance Value: 450 Ohm
Lead Channel Impedance Value: 860 Ohm
Lead Channel Pacing Threshold Amplitude: 0.5 V
Lead Channel Pacing Threshold Amplitude: 0.625 V
Lead Channel Pacing Threshold Amplitude: 1 V
Lead Channel Pacing Threshold Pulse Width: 0.5 ms
Lead Channel Pacing Threshold Pulse Width: 0.5 ms
Lead Channel Pacing Threshold Pulse Width: 0.5 ms
Lead Channel Sensing Intrinsic Amplitude: 1.3 mV
Lead Channel Sensing Intrinsic Amplitude: 10.5 mV
Lead Channel Setting Pacing Amplitude: 1.625
Lead Channel Setting Pacing Amplitude: 2 V
Lead Channel Setting Pacing Amplitude: 2.5 V
Lead Channel Setting Pacing Pulse Width: 0.5 ms
Lead Channel Setting Pacing Pulse Width: 0.5 ms
Lead Channel Setting Sensing Sensitivity: 0.5 mV
Pulse Gen Serial Number: 7222096

## 2018-04-30 NOTE — Progress Notes (Signed)
Remote ICD transmission.   

## 2018-05-13 ENCOUNTER — Ambulatory Visit (INDEPENDENT_AMBULATORY_CARE_PROVIDER_SITE_OTHER): Payer: Medicare HMO

## 2018-05-13 ENCOUNTER — Other Ambulatory Visit: Payer: Self-pay

## 2018-05-13 DIAGNOSIS — Z95810 Presence of automatic (implantable) cardiac defibrillator: Secondary | ICD-10-CM | POA: Diagnosis not present

## 2018-05-13 DIAGNOSIS — I5022 Chronic systolic (congestive) heart failure: Secondary | ICD-10-CM

## 2018-05-14 NOTE — Progress Notes (Signed)
EPIC Encounter for ICM Monitoring  Patient Name: [name] is a 63 y.o. female Date: 05/14/2018 Primary Care Physican: Glenda Chroman, MD Primary Cardiologist:Branch Electrophysiologist:Allred Bi-V Pacing:99% LastWeight:185lbs   Transmission reviewed.   Thoracic impedancenormal.  Prescribed:Torsemide20 mg 1 tablet as needed.  Recommendations:No changes  Follow-up plan: ICM clinic phone appointment on6/08/2018.   Copy of ICM check sent to Dr.Allred.  3 month ICM trend: 05/13/2018    1 Year ICM trend:       [name], RN 05/14/2018 5:08 PM

## 2018-06-04 NOTE — Progress Notes (Signed)
Virtual Visit via Video Note  I connected with [name] on 06/10/18 at  9:00 AM EDT by a video enabled telemedicine application and verified that I am speaking with the correct person using two identifiers.   I discussed the limitations of evaluation and management by telemedicine and the availability of in person appointments. The patient expressed understanding and agreed to proceed.     I discussed the assessment and treatment plan with the patient. The patient was provided an opportunity to ask questions and all were answered. The patient agreed with the plan and demonstrated an understanding of the instructions.   The patient was advised to call back or seek an in-person evaluation if the symptoms worsen or if the condition fails to improve as anticipated.  I provided 15 minutes of non-face-to-face time during this encounter.   Norman Clay, MD    [facility] MD/PA/NP OP Progress Note  06/10/2018 9:40 AM [name]  MRN:  [id number]  Chief Complaint:  Chief Complaint    Follow-up; Depression     HPI:  This is a follow-up appointment for depression.  She states that she got engaged with her boyfriend, who she has been discussing before. She may need to go back and forth between [place] and [place] as he has a job in [place].  Although she feels anxious when they talk about their relationship, she feels good about this relationship. She is interested in lowering the dose of sertraline as she has been doing very well. She denies insomnia. She denies feeling depressed.  She has good concentration.  She denies SI.  She denies panic attacks.  She has not taken Valium for the past few months.    Visit Diagnosis:    ICD-10-CM   1. MDD (major depressive disorder), recurrent, in full remission ([place]) F33.42 sertraline (ZOLOFT) 100 MG tablet    Past Psychiatric History: Please see initial evaluation for full details. I have reviewed the history. No updates at this time.     Past Medical  History:  Past Medical History:  Diagnosis Date  . Anxiety   . Arthritis    "joints; elbows; knees; back; neck:" (08/30/2016)  . Asthma   . Cancer of skin, face   . Chronic back pain    "mid to upper" (08/30/2016)  . Chronic bronchitis ([place])   . Chronic kidney failure, stage 2 (mild)   . Chronic systolic HF (heart failure) ([place])    a. NICM b. RHC (09/08/13): RA: 2, RV 26/0/2, PA: 25/9 (15), PCWP: 9, Fick CO/CI: 4.6 / 2.5, PA 68% c. ECHO (08/2013) EF 20-25%, grade II DD, RV sys fx mildly reduced, mod TR  . COPD (chronic obstructive pulmonary disease) ([place])   . Depression   . Fibromyalgia   . GERD (gastroesophageal reflux disease)   . Hypothyroidism   . ICD (implantable cardioverter-defibrillator), dual, in situ 01/12/2014   [employer] Jude ; for primary prevention for EF<35% by Dr Caryl Comes  . Left bundle branch block   . NICM (nonischemic cardiomyopathy) ([place])    a. LHC (08/2013): no angiographic evidence of CAD  . OSA on CPAP   . Pneumonia    "several times" (08/30/2016)    Past Surgical History:  Procedure Laterality Date  . BI-VENTRICULAR IMPLANTABLE CARDIOVERTER DEFIBRILLATOR N/A 01/12/2014   SJM Quadra Assura CRT-D biv ICD implanted by Dr Caryl Comes for primary prevention of sudden death  . CARDIAC CATHETERIZATION    . CHOLECYSTECTOMY N/A 08/30/2016   Procedure: LAPAROSCOPIC CHOLECYSTECTOMY WITH INTRAOPERATIVE  CHOLANGIOGRAM;  Surgeon: Fanny Skates, MD;  Location: [facility];  Service: General;  Laterality: N/A;  . FRACTURE SURGERY    . HUMERUS FRACTURE SURGERY Left    "fell after I had my RCR"  . LAPAROSCOPIC CHOLECYSTECTOMY  08/30/2016   w/IOC  . LAPAROSCOPIC GASTRIC BANDING    . LEFT AND RIGHT HEART CATHETERIZATION WITH CORONARY ANGIOGRAM N/A 09/08/2013   Procedure: LEFT AND RIGHT HEART CATHETERIZATION WITH CORONARY ANGIOGRAM;  Surgeon: Burnell Blanks, MD;  Location: [facility] CATH LAB;  Service: Cardiovascular;  Laterality: N/A;  . SHOULDER OPEN ROTATOR CUFF REPAIR Left   . TONSILLECTOMY     . TUBAL LIGATION    . VAGINAL HYSTERECTOMY      Family Psychiatric History: Please see initial evaluation for full details. I have reviewed the history. No updates at this time.     Family History:  Family History  Problem Relation Age of Onset  . Hypertension Mother        deceased: adenocarcinoma, HLD  . Cancer Father        deceased: basal cell carcinoma  . Alcohol abuse Father   . Cancer Sister        breast  . Cancer Brother        skin  . Hyperlipidemia Brother     Social History:  Social History   Socioeconomic History  . Marital status: Widowed    Spouse name: Not on file  . Number of children: Not on file  . Years of education: Not on file  . Highest education level: Not on file  Occupational History  . Not on file  Social Needs  . Financial resource strain: Not on file  . Food insecurity:    Worry: Not on file    Inability: Not on file  . Transportation needs:    Medical: Not on file    Non-medical: Not on file  Tobacco Use  . Smoking status: Former Smoker    Packs/day: 1.00    Years: 16.00    Pack years: 16.00    Types: Cigarettes    Start date: 04/22/1972    Last attempt to quit: 09/25/1988    Years since quitting: 29.7  . Smokeless tobacco: Never Used  Substance and Sexual Activity  . Alcohol use: Yes    Alcohol/week: 0.0 standard drinks    Comment: 08/30/2016 "mixed drink q 2-3 months"  . Drug use: No  . Sexual activity: Not Currently  Lifestyle  . Physical activity:    Days per week: Not on file    Minutes per session: Not on file  . Stress: Not on file  Relationships  . Social connections:    Talks on phone: Not on file    Gets together: Not on file    Attends religious service: Not on file    Active member of club or organization: Not on file    Attends meetings of clubs or organizations: Not on file    Relationship status: Not on file  Other Topics Concern  . Not on file  Social History Narrative   Lives in [place] by herself and  daughter lives next door. Retired [place]    Allergies:  Allergies  Allergen Reactions  . Nexium [Esomeprazole Magnesium] Swelling and Rash  . Nucynta [Tapentadol] Itching  . Oxycodone Itching    Metabolic Disorder Labs: No results found for: HGBA1C, MPG No results found for: PROLACTIN Lab Results  Component Value Date   CHOL 226 (H) 01/18/2014  TRIG 202 (H) 01/18/2014   HDL 62 01/18/2014   CHOLHDL 3.6 01/18/2014   VLDL 40 01/18/2014   LDLCALC 124 (H) 01/18/2014   Lab Results  Component Value Date   TSH 0.841 06/20/2016   TSH 0.085 (L) 09/11/2014    Therapeutic Level Labs: No results found for: LITHIUM No results found for: VALPROATE No components found for:  CBMZ  Current Medications: Current Outpatient Medications  Medication Sig Dispense Refill  . acetaminophen (TYLENOL) 500 MG tablet Take 1,000 mg by mouth 2 (two) times daily as needed for moderate pain or headache.    Marland Kitchen aspirin EC 81 MG EC tablet Take 1 tablet (81 mg total) by mouth daily.    Marland Kitchen atorvastatin (LIPITOR) 20 MG tablet Take 20 mg by mouth at bedtime.    . cetirizine (ZYRTEC) 10 MG tablet Take 10 mg by mouth at bedtime.     . diazepam (VALIUM) 5 MG tablet Take 2.5-5 mg by mouth 2 (two) times daily as needed for anxiety.     . diclofenac sodium (VOLTAREN) 1 % GEL Apply 1 application topically 3 (three) times daily as needed (fibromyalgia).     Marland Kitchen docusate sodium (COLACE) 100 MG capsule Take 200 mg by mouth at bedtime as needed for mild constipation.    Marland Kitchen EPINEPHrine (EPIPEN 2-PAK) 0.3 mg/0.3 mL IJ SOAJ injection Inject 0.3 mLs (0.3 mg total) into the muscle once as needed (for severe allergic reaction). CAll 911 immediately if you have to use this medicine 1 Device 1  . gabapentin (NEURONTIN) 300 MG capsule Take 600 mg by mouth 3 (three) times daily.     Marland Kitchen HYDROcodone-acetaminophen (NORCO) 10-325 MG tablet Take 1 tablet by mouth every 6 (six) hours as needed.    . hydrocortisone cream 1 % Apply 1 application  topically 2 (two) times daily as needed for itching.    . hydrOXYzine (ATARAX/VISTARIL) 10 MG tablet Take 1 tablet (10 mg total) by mouth every 6 (six) hours as needed for itching. 20 tablet 0  . levothyroxine (SYNTHROID, LEVOTHROID) 112 MCG tablet Take 112 mcg by mouth daily before breakfast.    . loperamide (IMODIUM) 2 MG capsule Take 1 capsule (2 mg total) by mouth 4 (four) times daily as needed for diarrhea or loose stools. 30 capsule 0  . losartan (COZAAR) 25 MG tablet TAKE 1/2 TABLET BY MOUTH DAILY 45 tablet 4  . metoprolol succinate (TOPROL-XL) 25 MG 24 hr tablet TAKE 0.5 TABLETS (12.5 MG TOTAL) BY MOUTH DAILY. 45 tablet 3  . Multiple Vitamin (MULTIVITAMIN WITH MINERALS) TABS tablet Take 1 tablet by mouth daily.    . Multiple Vitamins-Minerals (WOMENS MULTIVITAMIN) TABS Take 1 tablet by mouth daily.    Marland Kitchen OVER THE COUNTER MEDICATION Take by mouth 2 (two) times daily. AND RED :  VITAMIN: MACULUAR DEGENERATION    . polyethylene glycol (MIRALAX / GLYCOLAX) packet Take 17 g by mouth daily as needed for mild constipation.     . Probiotic CAPS Take 1 capsule by mouth daily.    . promethazine (PHENERGAN) 25 MG tablet Take 25 mg by mouth daily as needed for nausea or vomiting.     . ranitidine (ZANTAC) 150 MG tablet Take 150 mg by mouth 2 (two) times daily.     . sertraline (ZOLOFT) 100 MG tablet Take 1 tablet (100 mg total) by mouth daily. 90 tablet 0  . spironolactone (ALDACTONE) 25 MG tablet Take 0.5 tablets (12.5 mg total) by mouth at bedtime. 45 tablet 3  .  tiZANidine (ZANAFLEX) 4 MG tablet Take 2-4 mg by mouth every 8 (eight) hours as needed for muscle spasms (for fibromyalgia).     . torsemide (DEMADEX) 20 MG tablet TAKE 1 TABLET (20 MG TOTAL) BY MOUTH DAILY AS NEEDED (FOR SWELLING). 90 tablet 1  . TURMERIC PO Take 100 mg by mouth daily.     No current facility-administered medications for this visit.      Musculoskeletal: Strength & Muscle Tone: N/A Gait & Station: N/A Patient leans:  N/A  Psychiatric Specialty Exam: Review of Systems  Psychiatric/Behavioral: Negative for depression, hallucinations, memory loss, substance abuse and suicidal ideas. The patient is nervous/anxious. The patient does not have insomnia.   All other systems reviewed and are negative.   There were no vitals taken for this visit.There is no height or weight on file to calculate BMI.  General Appearance: Fairly Groomed  Eye Contact:  Good  Speech:  Clear and Coherent  Volume:  Normal  Mood:  Anxious  Affect:  Appropriate, Congruent and Full Range  Thought Process:  Coherent  Orientation:  Full (Time, Place, and Person)  Thought Content: Logical   Suicidal Thoughts:  No  Homicidal Thoughts:  No  Memory:  Immediate;   Good  Judgement:  Good  Insight:  Fair  Psychomotor Activity:  Normal  Concentration:  Concentration: Good and Attention Span: Good  Recall:  Good  Fund of Knowledge: Good  Language: Good  Akathisia:  No  Handed:  Right  AIMS (if indicated): not done  Assets:  Communication Skills Desire for Improvement  ADL's:  Intact  Cognition: WNL  Sleep:  Fair   Screenings: PHQ2-9     Nutrition from 04/12/2016 in [facility]  PHQ-2 Total Score  1       Assessment and Plan:  [name] is a 63 y.o. year old female with a history of depression,fibromyalgia,Chronic systolic HF/NICM, s/p ICD , who presents for follow up appointment for MDD (major depressive disorder), recurrent, in full remission ([place]) - Plan: sertraline (ZOLOFT) 100 MG tablet  # MDD, recurrent in full remission She denies significant mood symptoms except occasional anxiety since last visit, which also coincided with upcoming marriage.  Psychosocial stressors includes pain from fibromyalgia, loss of her husband several years ago, and her son with Charcot neuropathy.  Will decrease sertraline as she has been in remission of depression for several months.  Discussed risk  of relapse in her symptoms.  She is on Valium as needed for anxiety, prescribed by PCP.  Discussed risk of dependence and somnolence.   She will likely lives in [place]; she is advised to find a new psychiatrist/PCP for follow up for  depression.   Plan I have reviewed and updated plans as below 1. Decrease sertraline 100 mg daily  2.Continue valium 5 mg daily prn for anxiety 3.Return to clinic as needed.  - on Gabapentin for pruritis from shingles, pain  The patient demonstrates the following risk factors for suicide: Chronic risk factors for suicide include:psychiatric disorder ofdepression. Acute risk factorsfor suicide include: family or marital conflict and unemployment. Protective factorsfor this patient include: responsibility to others (children, family), coping skills and hope for the future. Considering these factors, the overall suicide risk at this point appears to below. Patientisappropriate for outpatient follow up. Although she does have gus at home, she adamantly denies any plan/intent and is amenable to the treatment.  Norman Clay, MD 06/10/2018, 9:40 AM

## 2018-06-10 ENCOUNTER — Encounter (HOSPITAL_COMMUNITY): Payer: Self-pay | Admitting: Psychiatry

## 2018-06-10 ENCOUNTER — Ambulatory Visit (INDEPENDENT_AMBULATORY_CARE_PROVIDER_SITE_OTHER): Payer: Medicare HMO | Admitting: Psychiatry

## 2018-06-10 ENCOUNTER — Other Ambulatory Visit: Payer: Self-pay

## 2018-06-10 DIAGNOSIS — F3342 Major depressive disorder, recurrent, in full remission: Secondary | ICD-10-CM

## 2018-06-10 MED ORDER — SERTRALINE HCL 100 MG PO TABS: 100.0000 mg | ORAL_TABLET | Freq: Every day | ORAL | 0 refills | Status: AC

## 2018-06-10 NOTE — Patient Instructions (Signed)
1. Decrease sertraline 100 mg daily  2.Continue valium 5 mg daily prn for anxiety 3.Return to clinic as needed.

## 2018-06-17 ENCOUNTER — Ambulatory Visit (INDEPENDENT_AMBULATORY_CARE_PROVIDER_SITE_OTHER): Payer: Medicare HMO

## 2018-06-17 DIAGNOSIS — I5022 Chronic systolic (congestive) heart failure: Secondary | ICD-10-CM

## 2018-06-17 DIAGNOSIS — Z95810 Presence of automatic (implantable) cardiac defibrillator: Secondary | ICD-10-CM

## 2018-06-18 NOTE — Progress Notes (Signed)
EPIC Encounter for ICM Monitoring  Patient Name: [name] is a 63 y.o. female Date: 06/18/2018 Primary Care Physican: Glenda Chroman, MD Primary Cardiologist:Branch Electrophysiologist:Allred Bi-V Pacing:99% LastWeight:185lbs   Transmission reviewed.   Thoracic impedancenormal.  Prescribed:Torsemide20 mg 1 tablet as needed.  Recommendations:No changes  Follow-up plan: ICM clinic phone appointment on7/13/2020.   Copy of ICM check sent to Dr.Allred.   3 month ICM trend: 06/17/2018    1 Year ICM trend:       [name], RN 06/18/2018 12:47 PM

## 2018-07-16 ENCOUNTER — Other Ambulatory Visit: Payer: Self-pay | Admitting: Cardiology

## 2018-07-16 DIAGNOSIS — Z4502 Encounter for adjustment and management of automatic implantable cardiac defibrillator: Secondary | ICD-10-CM

## 2018-07-16 DIAGNOSIS — I5022 Chronic systolic (congestive) heart failure: Secondary | ICD-10-CM

## 2018-07-16 DIAGNOSIS — Z9189 Other specified personal risk factors, not elsewhere classified: Secondary | ICD-10-CM

## 2018-07-16 DIAGNOSIS — I428 Other cardiomyopathies: Secondary | ICD-10-CM

## 2018-07-24 ENCOUNTER — Ambulatory Visit (INDEPENDENT_AMBULATORY_CARE_PROVIDER_SITE_OTHER): Payer: Medicare HMO

## 2018-07-24 DIAGNOSIS — I5022 Chronic systolic (congestive) heart failure: Secondary | ICD-10-CM

## 2018-07-24 DIAGNOSIS — Z95810 Presence of automatic (implantable) cardiac defibrillator: Secondary | ICD-10-CM

## 2018-07-26 NOTE — Progress Notes (Signed)
EPIC Encounter for ICM Monitoring  Patient Name: [name] is a 63 y.o. female Date: 07/26/2018 Primary Care Physican: Glenda Chroman, MD Primary Cardiologist:Branch Electrophysiologist:Allred Bi-V Pacing:99% LastWeight:185lbs  AT/AF Burden <1%  Transmission reviewed.   CorVue thoracic impedancenormal.  Prescribed:Torsemide20 mg 1 tablet as needed.  Recommendations:No changes  Follow-up plan: ICM clinic phone appointment on8/17/2020.   Copy of ICM check sent to Dr.Allred.  3 month ICM trend: 07/23/2018    1 Year ICM trend:       [name], RN 07/26/2018 10:43 AM

## 2018-08-06 ENCOUNTER — Other Ambulatory Visit: Payer: Self-pay | Admitting: Cardiology

## 2018-08-06 DIAGNOSIS — I5022 Chronic systolic (congestive) heart failure: Secondary | ICD-10-CM

## 2018-08-26 ENCOUNTER — Ambulatory Visit (INDEPENDENT_AMBULATORY_CARE_PROVIDER_SITE_OTHER): Payer: Self-pay

## 2018-08-26 DIAGNOSIS — Z95810 Presence of automatic (implantable) cardiac defibrillator: Secondary | ICD-10-CM

## 2018-08-26 DIAGNOSIS — I5022 Chronic systolic (congestive) heart failure: Secondary | ICD-10-CM

## 2018-08-27 ENCOUNTER — Ambulatory Visit (INDEPENDENT_AMBULATORY_CARE_PROVIDER_SITE_OTHER): Payer: Self-pay | Admitting: *Deleted

## 2018-08-27 DIAGNOSIS — I428 Other cardiomyopathies: Secondary | ICD-10-CM

## 2018-08-27 LAB — CUP PACEART REMOTE DEVICE CHECK
Battery Remaining Longevity: 35 mo
Battery Remaining Percentage: 41 %
Battery Voltage: 2.95 V
Brady Statistic AP VP Percent: 35 %
Brady Statistic AP VS Percent: 1 %
Brady Statistic AS VP Percent: 64 %
Brady Statistic AS VS Percent: 1 %
Brady Statistic RA Percent Paced: 36 %
Date Time Interrogation Session: 20200817221315
HighPow Impedance: 73 Ohm
HighPow Impedance: 73 Ohm
Implantable Lead Implant Date: 20160104
Implantable Lead Implant Date: 20160104
Implantable Lead Implant Date: 20160104
Implantable Lead Location: 753858
Implantable Lead Location: 753859
Implantable Lead Location: 753860
Implantable Lead Model: 7122
Implantable Pulse Generator Implant Date: 20160104
Lead Channel Impedance Value: 1050 Ohm
Lead Channel Impedance Value: 380 Ohm
Lead Channel Impedance Value: 460 Ohm
Lead Channel Pacing Threshold Amplitude: 0.5 V
Lead Channel Pacing Threshold Amplitude: 0.625 V
Lead Channel Pacing Threshold Amplitude: 1 V
Lead Channel Pacing Threshold Pulse Width: 0.5 ms
Lead Channel Pacing Threshold Pulse Width: 0.5 ms
Lead Channel Pacing Threshold Pulse Width: 0.5 ms
Lead Channel Sensing Intrinsic Amplitude: 1.7 mV
Lead Channel Sensing Intrinsic Amplitude: 12 mV
Lead Channel Setting Pacing Amplitude: 1.625
Lead Channel Setting Pacing Amplitude: 2 V
Lead Channel Setting Pacing Amplitude: 2.5 V
Lead Channel Setting Pacing Pulse Width: 0.5 ms
Lead Channel Setting Pacing Pulse Width: 0.5 ms
Lead Channel Setting Sensing Sensitivity: 0.5 mV
Pulse Gen Serial Number: 7222096

## 2018-08-28 ENCOUNTER — Telehealth: Payer: Self-pay

## 2018-08-28 NOTE — Progress Notes (Signed)
Replied to patient's my chart message.  Provided transmission result and advised to call if she experiences any fluid symptoms.

## 2018-08-28 NOTE — Progress Notes (Signed)
EPIC Encounter for ICM Monitoring  Patient Name: [name] is a 63 y.o. female Date: 08/28/2018 Primary Care Physican: Glenda Chroman, MD Primary Cardiologist:Branch Electrophysiologist:Allred Bi-V Pacing:99% LastWeight:185lbs  AT/AF Burden <1%  Attempted call to patient and unable to reach.   Transmission reviewed.   CorVue thoracic impedancenormal.  Prescribed:Torsemide20 mg 1 tablet as needed.  Recommendations: Unable to reach.    Follow-up plan: ICM clinic phone appointment on10/19/2020.   Copy of ICM check sent to Dr.Allred.   3 month ICM trend: 08/27/2018    1 Year ICM trend:       [name], RN 08/28/2018 10:28 AM

## 2018-08-28 NOTE — Telephone Encounter (Signed)
Remote ICM transmission received.  Attempted call to patient regarding ICM remote transmission and no answer.  

## 2018-09-05 ENCOUNTER — Encounter: Payer: Self-pay | Admitting: Cardiology

## 2018-09-05 ENCOUNTER — Telehealth: Payer: Self-pay | Admitting: Physician Assistant

## 2018-09-05 NOTE — Progress Notes (Signed)
Remote ICD transmission.   

## 2018-09-05 NOTE — Telephone Encounter (Signed)
Patient called because she has been having problems with hypotension.  Her blood pressure has been as low as 68/44.  Although she was recently put on nitrofurantoin for a bladder infection, no other new medical problems.  With the low blood pressures, she has been weak and lightheaded.  However, she does not feel that she is in danger of passing out or falling.  Upon review of her medications, she has been compliant with with them.  She is on low doses of losartan, Spironolactone, and metoprolol.  She is on torsemide as needed, but has not taken it recently.  Requested that she increase her water intake to make sure she is hydrated.  She is to hold the losartan, metoprolol, and Spironolactone.  She is currently living in [place].  She does have a PCP down there, but not a cardiologist.  She is requested to call the PCP tomorrow and try to get into be seen.  She will not be able to keep her appointment on September 4 with Dr. Rayann Heman.  She is agreeable to doing this as a virtual visit.  She has been doing downloads of her device as scheduled.  If her symptoms worsen, she feels that she is in danger of falling or passing out, she is to call 911 and go to the closest emergency room.  The patient indicated understanding and agrees to the plan.  Rosaria Ferries, PA-C 09/05/2018 8:44 PM Beeper [phone]

## 2018-09-13 ENCOUNTER — Encounter: Payer: Self-pay | Admitting: Internal Medicine

## 2018-09-13 ENCOUNTER — Telehealth (INDEPENDENT_AMBULATORY_CARE_PROVIDER_SITE_OTHER): Payer: Self-pay | Admitting: Internal Medicine

## 2018-09-13 VITALS — BP 111/72 | HR 75 | Ht 63.0 in | Wt 182.0 lb

## 2018-09-13 DIAGNOSIS — I5022 Chronic systolic (congestive) heart failure: Secondary | ICD-10-CM

## 2018-09-13 DIAGNOSIS — I447 Left bundle-branch block, unspecified: Secondary | ICD-10-CM

## 2018-09-13 DIAGNOSIS — I428 Other cardiomyopathies: Secondary | ICD-10-CM

## 2018-09-13 NOTE — Progress Notes (Signed)
Electrophysiology TeleHealth Note Due to national recommendations of social distancing due to [place] 19, an audio telehealth visit is felt to be most appropriate for this patient at this time.  Verbal consent was obtained by me for the telehealth visit today.  The patient does not have capability for a virtual visit.  A phone visit is therefore required today.   Date:  09/13/2018   ID:  [name], DOB 25-Aug-1955, MRN [id number]  Location: patient's home  Provider location:  [facility]  Evaluation Performed: Follow-up visit  PCP:  Glenda Chroman, MD   Electrophysiologist:  Dr Rayann Heman  Chief Complaint:  palpitations  History of Present Illness:    [name] is a 63 y.o. female who presents via telehealth conferencing today.  Since last being seen in our clinic, the patient reports doing very well.  She continues to have occasional low BP.  She in engaged and has moved to [facility].  She has not established cardiology care there.  Today, she denies symptoms of palpitations, chest pain, shortness of breath,  lower extremity edema, dizziness, presyncope, or syncope.  The patient is otherwise without complaint today.  The patient denies symptoms of fevers, chills, cough, or new SOB worrisome for COVID 19.  Past Medical History:  Diagnosis Date  . Anxiety   . Arthritis    "joints; elbows; knees; back; neck:" (08/30/2016)  . Asthma   . Cancer of skin, face   . Chronic back pain    "mid to upper" (08/30/2016)  . Chronic bronchitis ([place])   . Chronic kidney failure, stage 2 (mild)   . Chronic systolic HF (heart failure) ([place])    a. NICM b. RHC (09/08/13): RA: 2, RV 26/0/2, PA: 25/9 (15), PCWP: 9, Fick CO/CI: 4.6 / 2.5, PA 68% c. ECHO (08/2013) EF 20-25%, grade II DD, RV sys fx mildly reduced, mod TR  . COPD (chronic obstructive pulmonary disease) ([place])   . Depression   . Fibromyalgia   . GERD (gastroesophageal reflux disease)   . Hypothyroidism   . ICD (implantable  cardioverter-defibrillator), dual, in situ 01/12/2014   [employer] ; for primary prevention for EF<35% by Dr Caryl Comes  . Left bundle branch block   . NICM (nonischemic cardiomyopathy) ([place])    a. LHC (08/2013): no angiographic evidence of CAD  . OSA on CPAP   . Pneumonia    "several times" (08/30/2016)    Past Surgical History:  Procedure Laterality Date  . BI-VENTRICULAR IMPLANTABLE CARDIOVERTER DEFIBRILLATOR N/A 01/12/2014   SJM Quadra Assura CRT-D biv ICD implanted by Dr Caryl Comes for primary prevention of sudden death  . CARDIAC CATHETERIZATION    . CHOLECYSTECTOMY N/A 08/30/2016   Procedure: LAPAROSCOPIC CHOLECYSTECTOMY WITH INTRAOPERATIVE CHOLANGIOGRAM;  Surgeon: Fanny Skates, MD;  Location: [place];  Service: General;  Laterality: N/A;  . FRACTURE SURGERY    . HUMERUS FRACTURE SURGERY Left    "fell after I had my RCR"  . LAPAROSCOPIC CHOLECYSTECTOMY  08/30/2016   w/IOC  . LAPAROSCOPIC GASTRIC BANDING    . LEFT AND RIGHT HEART CATHETERIZATION WITH CORONARY ANGIOGRAM N/A 09/08/2013   Procedure: LEFT AND RIGHT HEART CATHETERIZATION WITH CORONARY ANGIOGRAM;  Surgeon: Burnell Blanks, MD;  Location: [facility] CATH LAB;  Service: Cardiovascular;  Laterality: N/A;  . SHOULDER OPEN ROTATOR CUFF REPAIR Left   . TONSILLECTOMY    . TUBAL LIGATION    . VAGINAL HYSTERECTOMY      Current Outpatient Medications  Medication Sig Dispense  Refill  . acetaminophen (TYLENOL) 500 MG tablet Take 1,000 mg by mouth 2 (two) times daily as needed for moderate pain or headache.    Marland Kitchen aspirin EC 81 MG EC tablet Take 1 tablet (81 mg total) by mouth daily. (Patient taking differently: Take 81 mg by mouth. Occasionally)    . atorvastatin (LIPITOR) 20 MG tablet Take 20 mg by mouth at bedtime.    . diazepam (VALIUM) 5 MG tablet Take 2.5-5 mg by mouth 2 (two) times daily as needed for anxiety.     . diclofenac sodium (VOLTAREN) 1 % GEL Apply 1 application topically 3 (three) times daily as needed (fibromyalgia).     .  diphenhydrAMINE (BENADRYL) 25 MG tablet Take 12.5 mg by mouth 2 (two) times daily.    Marland Kitchen docusate sodium (COLACE) 100 MG capsule Take 200 mg by mouth at bedtime as needed for mild constipation.    Marland Kitchen EPINEPHrine (EPIPEN 2-PAK) 0.3 mg/0.3 mL IJ SOAJ injection Inject 0.3 mLs (0.3 mg total) into the muscle once as needed (for severe allergic reaction). CAll 911 immediately if you have to use this medicine 1 Device 1  . gabapentin (NEURONTIN) 300 MG capsule Take 600 mg by mouth 3 (three) times daily.     Marland Kitchen HYDROcodone-acetaminophen (NORCO) 10-325 MG tablet Take 1 tablet by mouth every 6 (six) hours as needed.    . hydrocortisone cream 1 % Apply 1 application topically 2 (two) times daily as needed for itching.    . levothyroxine (SYNTHROID, LEVOTHROID) 112 MCG tablet Take 112 mcg by mouth daily before breakfast.    . loperamide (IMODIUM) 2 MG capsule Take 1 capsule (2 mg total) by mouth 4 (four) times daily as needed for diarrhea or loose stools. 30 capsule 0  . losartan (COZAAR) 25 MG tablet TAKE 1/2 TABLET BY MOUTH DAILY 45 tablet 0  . metoprolol succinate (TOPROL-XL) 25 MG 24 hr tablet TAKE 0.5 TABLETS (12.5 MG TOTAL) BY MOUTH DAILY. 45 tablet 3  . Multiple Vitamin (MULTIVITAMIN WITH MINERALS) TABS tablet Take 1 tablet by mouth daily.    Marland Kitchen OVER THE COUNTER MEDICATION Take by mouth 2 (two) times daily. AND RED :  VITAMIN: MACULUAR DEGENERATION    . polyethylene glycol (MIRALAX / GLYCOLAX) packet Take 17 g by mouth daily as needed for mild constipation.     . Probiotic CAPS Take 1 capsule by mouth daily.    . promethazine (PHENERGAN) 25 MG tablet Take 25 mg by mouth daily as needed for nausea or vomiting.     . ranitidine (ZANTAC) 150 MG tablet Take 150 mg by mouth 2 (two) times daily.     . sertraline (ZOLOFT) 100 MG tablet Take 1 tablet (100 mg total) by mouth daily. 90 tablet 0  . spironolactone (ALDACTONE) 25 MG tablet Take 0.5 tablets (12.5 mg total) by mouth at bedtime. 45 tablet 3  . tiZANidine  (ZANAFLEX) 4 MG tablet Take 2-4 mg by mouth every 8 (eight) hours as needed for muscle spasms (for fibromyalgia).     . torsemide (DEMADEX) 20 MG tablet TAKE 1 TABLET (20 MG TOTAL) BY MOUTH DAILY AS NEEDED (FOR SWELLING). 90 tablet 0  . TURMERIC PO Take 100 mg by mouth daily.     No current facility-administered medications for this visit.     Allergies:   Nexium [esomeprazole magnesium], Nucynta [tapentadol], and Oxycodone   Social History:  The patient  reports that she quit smoking about 29 years ago. Her smoking use included cigarettes.  She started smoking about 46 years ago. She has a 16.00 pack-year smoking history. She has never used smokeless tobacco. She reports current alcohol use. She reports that she does not use drugs.   Family History:  The patient's family history includes Alcohol abuse in her father; Cancer in her brother, father, and sister; Hyperlipidemia in her brother; Hypertension in her mother.   ROS:  Please see the history of present illness.   All other systems are personally reviewed and negative.    Exam:    Vital Signs:  BP 111/72   Pulse 75   Ht 5\' 3"  (1.6 m)   Wt 182 lb (82.6 kg)   BMI 32.24 kg/m   Well sounding   Labs/Other Tests and Data Reviewed:    Recent Labs: No results found for requested labs within last 8760 hours.   Wt Readings from Last 3 Encounters:  09/13/18 182 lb (82.6 kg)  09/14/17 192 lb (87.1 kg)  03/07/17 192 lb 9.6 oz (87.4 kg)     Last device remote is reviewed from [place] PDF which reveals normal device function, no arrhythmias    ASSESSMENT & PLAN:    1.  Nonischemic CM/ chronic systolic dysfunction/ LBBB Remotes for BiV ICD are up to date Normal device function Followed in the ICM device clinic   Follow-up:  Remotes 12 months with me (unless she establishes in [place] in the interim)   Patient Risk:  after full review of this patients clinical status, I feel that they are at moderate risk at this time.   Today, I have spent 15 minutes with the patient with telehealth technology discussing arrhythmia management .    Army Fossa, MD  09/13/2018 12:40 PM     [address] [place] [address] [phone] (office) [phone] (fax)

## 2018-10-28 ENCOUNTER — Ambulatory Visit (INDEPENDENT_AMBULATORY_CARE_PROVIDER_SITE_OTHER): Payer: Medicare (Managed Care)

## 2018-10-28 DIAGNOSIS — I5022 Chronic systolic (congestive) heart failure: Secondary | ICD-10-CM | POA: Diagnosis not present

## 2018-10-28 DIAGNOSIS — Z95810 Presence of automatic (implantable) cardiac defibrillator: Secondary | ICD-10-CM

## 2018-10-29 ENCOUNTER — Telehealth: Payer: Self-pay

## 2018-10-29 NOTE — Progress Notes (Signed)
EPIC Encounter for ICM Monitoring  Patient Name: [name] is a 63 y.o. female Date: 10/29/2018 Primary Care Physican: Glenda Chroman, MD Primary Cardiologist:Branch Electrophysiologist:[facility] Bi-V Pacing:99% LastWeight:185lbs  AT/AF Burden <1%  Attempted call to patient and unable to reach.   Transmission reviewed.   CorVue thoracic impedancenormal.  Prescribed:Torsemide20 mg 1 tablet as needed.  Recommendations: Unable to reach.    Follow-up plan: ICM clinic phone appointment on 12/09/2018.   91 day device clinic remote transmission 11/26/2018.    Copy of ICM check sent to Dr. Rayann Heman.   3 month ICM trend: 10/28/2018    1 Year ICM trend:       [name], RN 10/29/2018 3:43 PM

## 2018-10-29 NOTE — Telephone Encounter (Signed)
Remote ICM transmission received.  Attempted call to patient regarding ICM remote transmission and left detailed message per DPR.  Advised to return call for any fluid symptoms or questions.  

## 2018-11-04 ENCOUNTER — Other Ambulatory Visit: Payer: Self-pay | Admitting: Cardiology

## 2018-11-04 DIAGNOSIS — I5022 Chronic systolic (congestive) heart failure: Secondary | ICD-10-CM

## 2018-11-04 DIAGNOSIS — I428 Other cardiomyopathies: Secondary | ICD-10-CM

## 2018-11-04 DIAGNOSIS — Z4502 Encounter for adjustment and management of automatic implantable cardiac defibrillator: Secondary | ICD-10-CM

## 2018-11-04 DIAGNOSIS — Z9189 Other specified personal risk factors, not elsewhere classified: Secondary | ICD-10-CM

## 2018-11-26 ENCOUNTER — Encounter: Payer: Self-pay | Admitting: *Deleted

## 2018-11-28 ENCOUNTER — Telehealth: Payer: Self-pay

## 2018-11-28 NOTE — Telephone Encounter (Signed)
Left message for patient to remind of missed remote transmission.  

## 2018-12-10 ENCOUNTER — Telehealth: Payer: Self-pay

## 2018-12-10 NOTE — Telephone Encounter (Signed)
Left message for patient to remind of missed remote transmission.  

## 2018-12-16 NOTE — Progress Notes (Signed)
No ICM remote transmission received for 12/09/2018 and next ICM transmission scheduled for 01/20/2019.   

## 2018-12-25 ENCOUNTER — Other Ambulatory Visit: Payer: Self-pay

## 2018-12-25 DIAGNOSIS — Z9189 Other specified personal risk factors, not elsewhere classified: Secondary | ICD-10-CM

## 2018-12-25 DIAGNOSIS — Z4502 Encounter for adjustment and management of automatic implantable cardiac defibrillator: Secondary | ICD-10-CM

## 2018-12-25 DIAGNOSIS — I428 Other cardiomyopathies: Secondary | ICD-10-CM

## 2018-12-25 DIAGNOSIS — I5022 Chronic systolic (congestive) heart failure: Secondary | ICD-10-CM

## 2018-12-25 MED ORDER — LOSARTAN POTASSIUM 25 MG PO TABS: 12.5000 mg | ORAL_TABLET | Freq: Every day | ORAL | 0 refills | Status: DC

## 2018-12-26 ENCOUNTER — Other Ambulatory Visit: Payer: Self-pay

## 2018-12-26 DIAGNOSIS — Z4502 Encounter for adjustment and management of automatic implantable cardiac defibrillator: Secondary | ICD-10-CM

## 2018-12-26 DIAGNOSIS — I428 Other cardiomyopathies: Secondary | ICD-10-CM

## 2018-12-26 DIAGNOSIS — I5022 Chronic systolic (congestive) heart failure: Secondary | ICD-10-CM

## 2018-12-26 DIAGNOSIS — Z9189 Other specified personal risk factors, not elsewhere classified: Secondary | ICD-10-CM

## 2018-12-26 MED ORDER — SPIRONOLACTONE 25 MG PO TABS: 12.5000 mg | ORAL_TABLET | Freq: Every day | ORAL | 3 refills | Status: DC

## 2018-12-26 NOTE — Telephone Encounter (Signed)
Refilled aldactone to [facility] per MyChart message

## 2019-01-21 ENCOUNTER — Telehealth: Payer: Self-pay

## 2019-01-21 NOTE — Telephone Encounter (Signed)
Left message for patient to remind of missed remote transmission.  

## 2019-02-03 NOTE — Progress Notes (Signed)
No ICM remote transmission received for 01/20/2019 and next ICM transmission scheduled for 02/26/2019.

## 2019-02-06 ENCOUNTER — Other Ambulatory Visit: Payer: Self-pay | Admitting: Cardiology

## 2019-03-02 ENCOUNTER — Other Ambulatory Visit: Payer: Self-pay | Admitting: Internal Medicine

## 2019-03-12 NOTE — Progress Notes (Signed)
No ICM remote transmission received since 10/28/2018.  Patient not participating in monthly ICM follow up.  Disenrolled from [facility] clinic and device clinic will continue to monitor 91 day remote transmissions per protocol.

## 2019-03-28 ENCOUNTER — Telehealth: Payer: Self-pay

## 2019-03-28 NOTE — Telephone Encounter (Signed)
Pt transferred to a clinic in [place].

## 2019-04-20 ENCOUNTER — Other Ambulatory Visit: Payer: Self-pay | Admitting: Cardiology

## 2019-04-20 DIAGNOSIS — Z9189 Other specified personal risk factors, not elsewhere classified: Secondary | ICD-10-CM

## 2019-04-20 DIAGNOSIS — Z4502 Encounter for adjustment and management of automatic implantable cardiac defibrillator: Secondary | ICD-10-CM

## 2019-04-20 DIAGNOSIS — I428 Other cardiomyopathies: Secondary | ICD-10-CM

## 2019-04-20 DIAGNOSIS — I5022 Chronic systolic (congestive) heart failure: Secondary | ICD-10-CM

## 2019-09-03 ENCOUNTER — Other Ambulatory Visit: Payer: Self-pay | Admitting: Internal Medicine

## 2019-10-19 ENCOUNTER — Other Ambulatory Visit: Payer: Self-pay | Admitting: Internal Medicine

## 2019-10-19 DIAGNOSIS — I428 Other cardiomyopathies: Secondary | ICD-10-CM

## 2019-10-19 DIAGNOSIS — Z4502 Encounter for adjustment and management of automatic implantable cardiac defibrillator: Secondary | ICD-10-CM

## 2019-10-19 DIAGNOSIS — Z9189 Other specified personal risk factors, not elsewhere classified: Secondary | ICD-10-CM

## 2019-10-19 DIAGNOSIS — I5022 Chronic systolic (congestive) heart failure: Secondary | ICD-10-CM

## 2019-11-11 ENCOUNTER — Other Ambulatory Visit: Payer: Self-pay | Admitting: Internal Medicine

## 2019-11-11 DIAGNOSIS — I5022 Chronic systolic (congestive) heart failure: Secondary | ICD-10-CM

## 2019-11-11 DIAGNOSIS — Z9189 Other specified personal risk factors, not elsewhere classified: Secondary | ICD-10-CM

## 2019-11-11 DIAGNOSIS — I428 Other cardiomyopathies: Secondary | ICD-10-CM

## 2019-11-11 DIAGNOSIS — Z4502 Encounter for adjustment and management of automatic implantable cardiac defibrillator: Secondary | ICD-10-CM

## 2019-12-05 ENCOUNTER — Other Ambulatory Visit: Payer: Self-pay | Admitting: Internal Medicine

## 2019-12-05 DIAGNOSIS — I428 Other cardiomyopathies: Secondary | ICD-10-CM

## 2019-12-05 DIAGNOSIS — Z9189 Other specified personal risk factors, not elsewhere classified: Secondary | ICD-10-CM

## 2019-12-05 DIAGNOSIS — Z4502 Encounter for adjustment and management of automatic implantable cardiac defibrillator: Secondary | ICD-10-CM

## 2019-12-05 DIAGNOSIS — I5022 Chronic systolic (congestive) heart failure: Secondary | ICD-10-CM

## 2019-12-26 ENCOUNTER — Other Ambulatory Visit: Payer: Self-pay | Admitting: Cardiology

## 2019-12-26 DIAGNOSIS — I428 Other cardiomyopathies: Secondary | ICD-10-CM

## 2019-12-26 DIAGNOSIS — I5022 Chronic systolic (congestive) heart failure: Secondary | ICD-10-CM

## 2019-12-26 DIAGNOSIS — Z4502 Encounter for adjustment and management of automatic implantable cardiac defibrillator: Secondary | ICD-10-CM

## 2019-12-26 DIAGNOSIS — Z9189 Other specified personal risk factors, not elsewhere classified: Secondary | ICD-10-CM

## 2019-12-30 ENCOUNTER — Other Ambulatory Visit: Payer: Self-pay | Admitting: Internal Medicine

## 2019-12-30 DIAGNOSIS — Z9189 Other specified personal risk factors, not elsewhere classified: Secondary | ICD-10-CM

## 2019-12-30 DIAGNOSIS — I5022 Chronic systolic (congestive) heart failure: Secondary | ICD-10-CM

## 2019-12-30 DIAGNOSIS — Z4502 Encounter for adjustment and management of automatic implantable cardiac defibrillator: Secondary | ICD-10-CM

## 2019-12-30 DIAGNOSIS — I428 Other cardiomyopathies: Secondary | ICD-10-CM

## 2020-01-10 ENCOUNTER — Other Ambulatory Visit: Payer: Self-pay | Admitting: Internal Medicine

## 2020-01-13 NOTE — Telephone Encounter (Signed)
This is a [place] pt °
